# Patient Record
Sex: Male | Born: 1935
Health system: Southern US, Community
[De-identification: ages and names within clinical notes are randomized; demographics above are authoritative.]

## PROBLEM LIST (undated history)

## (undated) DIAGNOSIS — I255 Ischemic cardiomyopathy: Secondary | ICD-10-CM

## (undated) DIAGNOSIS — I495 Sick sinus syndrome: Secondary | ICD-10-CM

## (undated) DIAGNOSIS — Z950 Presence of cardiac pacemaker: Secondary | ICD-10-CM

## (undated) DIAGNOSIS — Z87442 Personal history of urinary calculi: Secondary | ICD-10-CM

## (undated) DIAGNOSIS — C9110 Chronic lymphocytic leukemia of B-cell type not having achieved remission: Secondary | ICD-10-CM

## (undated) DIAGNOSIS — I5020 Unspecified systolic (congestive) heart failure: Secondary | ICD-10-CM

## (undated) DIAGNOSIS — M1990 Unspecified osteoarthritis, unspecified site: Secondary | ICD-10-CM

## (undated) DIAGNOSIS — I441 Atrioventricular block, second degree: Secondary | ICD-10-CM

## (undated) DIAGNOSIS — Z8669 Personal history of other diseases of the nervous system and sense organs: Secondary | ICD-10-CM

## (undated) DIAGNOSIS — K5792 Diverticulitis of intestine, part unspecified, without perforation or abscess without bleeding: Secondary | ICD-10-CM

## (undated) DIAGNOSIS — K219 Gastro-esophageal reflux disease without esophagitis: Secondary | ICD-10-CM

## (undated) DIAGNOSIS — E7800 Pure hypercholesterolemia, unspecified: Secondary | ICD-10-CM

## (undated) DIAGNOSIS — C4490 Unspecified malignant neoplasm of skin, unspecified: Secondary | ICD-10-CM

## (undated) DIAGNOSIS — I2510 Atherosclerotic heart disease of native coronary artery without angina pectoris: Secondary | ICD-10-CM

## (undated) DIAGNOSIS — I447 Left bundle-branch block, unspecified: Secondary | ICD-10-CM

## (undated) DIAGNOSIS — D509 Iron deficiency anemia, unspecified: Secondary | ICD-10-CM

## (undated) DIAGNOSIS — I219 Acute myocardial infarction, unspecified: Secondary | ICD-10-CM

## (undated) DIAGNOSIS — H9190 Unspecified hearing loss, unspecified ear: Secondary | ICD-10-CM

## (undated) HISTORY — PX: EYE SURGERY: SHX253

## (undated) HISTORY — PX: BACK SURGERY: SHX140

## (undated) HISTORY — DX: Sick sinus syndrome: I49.5

## (undated) HISTORY — DX: Atherosclerotic heart disease of native coronary artery without angina pectoris: I25.10

## (undated) HISTORY — PX: TONSILLECTOMY: SUR1361

## (undated) HISTORY — PX: INSERT / REPLACE / REMOVE PACEMAKER: SUR710

## (undated) HISTORY — PX: CARDIAC CATHETERIZATION: SHX172

## (undated) HISTORY — DX: Ischemic cardiomyopathy: I25.5

## (undated) HISTORY — PX: SKIN CANCER EXCISION: SHX779

## (undated) SURGERY — PACEMAKER IMPLANT
Anesthesia: Choice

---

## 1981-02-25 HISTORY — PX: LUMBAR DISC SURGERY: SHX700

## 2003-12-20 ENCOUNTER — Ambulatory Visit: Payer: Self-pay | Admitting: Unknown Physician Specialty

## 2005-02-21 ENCOUNTER — Ambulatory Visit: Payer: Self-pay

## 2005-07-01 ENCOUNTER — Ambulatory Visit: Payer: Self-pay | Admitting: General Surgery

## 2005-07-01 ENCOUNTER — Emergency Department: Payer: Self-pay | Admitting: General Practice

## 2005-09-10 ENCOUNTER — Ambulatory Visit: Payer: Self-pay | Admitting: Urology

## 2008-02-26 HISTORY — PX: INGUINAL HERNIA REPAIR: SUR1180

## 2009-01-06 ENCOUNTER — Ambulatory Visit: Payer: Self-pay | Admitting: Unknown Physician Specialty

## 2010-10-20 ENCOUNTER — Inpatient Hospital Stay: Payer: Self-pay | Admitting: Internal Medicine

## 2012-03-02 ENCOUNTER — Ambulatory Visit: Payer: Self-pay | Admitting: Oncology

## 2012-03-02 LAB — CBC CANCER CENTER
Basophil %: 0.6 %
Eosinophil #: 0.2 x10 3/mm (ref 0.0–0.7)
Eosinophil %: 1.3 %
HCT: 44 % (ref 40.0–52.0)
HGB: 14.9 g/dL (ref 13.0–18.0)
Lymphocyte #: 11.8 x10 3/mm — ABNORMAL HIGH (ref 1.0–3.6)
MCH: 31.8 pg (ref 26.0–34.0)
MCHC: 33.9 g/dL (ref 32.0–36.0)
MCV: 94 fL (ref 80–100)
Monocyte #: 0.7 x10 3/mm (ref 0.2–1.0)
Monocyte %: 4.4 %
Neutrophil #: 3.5 x10 3/mm (ref 1.4–6.5)
Platelet: 235 x10 3/mm (ref 150–440)
RBC: 4.69 10*6/uL (ref 4.40–5.90)
WBC: 16.3 x10 3/mm — ABNORMAL HIGH (ref 3.8–10.6)

## 2012-03-02 LAB — LACTATE DEHYDROGENASE: LDH: 183 U/L (ref 85–241)

## 2012-03-28 ENCOUNTER — Ambulatory Visit: Payer: Self-pay | Admitting: Oncology

## 2012-06-04 ENCOUNTER — Ambulatory Visit: Payer: Self-pay | Admitting: Oncology

## 2012-06-08 LAB — CBC CANCER CENTER
Basophil #: 0.1 x10 3/mm (ref 0.0–0.1)
Basophil %: 0.4 %
Eosinophil %: 1.2 %
Lymphocyte #: 9.4 x10 3/mm — ABNORMAL HIGH (ref 1.0–3.6)
Lymphocyte %: 67.6 %
MCV: 94 fL (ref 80–100)
Monocyte #: 0.8 x10 3/mm (ref 0.2–1.0)
Neutrophil %: 25.2 %
Platelet: 179 x10 3/mm (ref 150–440)
RBC: 4.26 10*6/uL — ABNORMAL LOW (ref 4.40–5.90)
RDW: 13.9 % (ref 11.5–14.5)
WBC: 14 x10 3/mm — ABNORMAL HIGH (ref 3.8–10.6)

## 2012-06-25 ENCOUNTER — Ambulatory Visit: Payer: Self-pay | Admitting: Oncology

## 2012-08-25 ENCOUNTER — Ambulatory Visit: Payer: Self-pay | Admitting: Oncology

## 2012-12-08 ENCOUNTER — Ambulatory Visit: Payer: Self-pay | Admitting: Oncology

## 2012-12-08 LAB — CBC CANCER CENTER
Basophil #: 0.1 x10 3/mm (ref 0.0–0.1)
Eosinophil #: 0.1 x10 3/mm (ref 0.0–0.7)
HCT: 44.3 % (ref 40.0–52.0)
Lymphocyte #: 15.3 x10 3/mm — ABNORMAL HIGH (ref 1.0–3.6)
Lymphocyte %: 75.2 %
MCH: 32.5 pg (ref 26.0–34.0)
MCV: 96 fL (ref 80–100)
Monocyte #: 0.8 x10 3/mm (ref 0.2–1.0)
Monocyte %: 3.8 %
Neutrophil %: 20.2 %
Platelet: 261 x10 3/mm (ref 150–440)

## 2012-12-26 ENCOUNTER — Ambulatory Visit: Payer: Self-pay | Admitting: Oncology

## 2013-03-09 ENCOUNTER — Ambulatory Visit: Payer: Self-pay | Admitting: Oncology

## 2013-03-09 LAB — CBC CANCER CENTER
BASOS PCT: 0.6 %
Basophil #: 0.1 x10 3/mm (ref 0.0–0.1)
EOS ABS: 0.1 x10 3/mm (ref 0.0–0.7)
Eosinophil %: 1 %
HCT: 44.4 % (ref 40.0–52.0)
HGB: 14.5 g/dL (ref 13.0–18.0)
LYMPHS PCT: 72.8 %
Lymphocyte #: 11.1 x10 3/mm — ABNORMAL HIGH (ref 1.0–3.6)
MCH: 31 pg (ref 26.0–34.0)
MCHC: 32.6 g/dL (ref 32.0–36.0)
MCV: 95 fL (ref 80–100)
Monocyte #: 0.7 x10 3/mm (ref 0.2–1.0)
Monocyte %: 4.6 %
NEUTROS PCT: 21 %
Neutrophil #: 3.2 x10 3/mm (ref 1.4–6.5)
Platelet: 228 x10 3/mm (ref 150–440)
RBC: 4.67 10*6/uL (ref 4.40–5.90)
RDW: 13.9 % (ref 11.5–14.5)
WBC: 15.2 x10 3/mm — AB (ref 3.8–10.6)

## 2013-03-28 ENCOUNTER — Ambulatory Visit: Payer: Self-pay | Admitting: Oncology

## 2013-06-04 ENCOUNTER — Ambulatory Visit: Payer: Self-pay | Admitting: Oncology

## 2013-06-07 LAB — CBC CANCER CENTER
BASOS ABS: 0.1 x10 3/mm (ref 0.0–0.1)
BASOS PCT: 0.7 %
Eosinophil #: 0.1 x10 3/mm (ref 0.0–0.7)
Eosinophil %: 0.9 %
HCT: 41.4 % (ref 40.0–52.0)
HGB: 13.5 g/dL (ref 13.0–18.0)
Lymphocyte #: 8.6 x10 3/mm — ABNORMAL HIGH (ref 1.0–3.6)
Lymphocyte %: 65.7 %
MCH: 31.2 pg (ref 26.0–34.0)
MCHC: 32.7 g/dL (ref 32.0–36.0)
MCV: 95 fL (ref 80–100)
MONO ABS: 0.8 x10 3/mm (ref 0.2–1.0)
MONOS PCT: 5.9 %
NEUTROS ABS: 3.5 x10 3/mm (ref 1.4–6.5)
NEUTROS PCT: 26.8 %
PLATELETS: 202 x10 3/mm (ref 150–440)
RBC: 4.34 10*6/uL — AB (ref 4.40–5.90)
RDW: 14.2 % (ref 11.5–14.5)
WBC: 13.1 x10 3/mm — ABNORMAL HIGH (ref 3.8–10.6)

## 2013-06-25 ENCOUNTER — Ambulatory Visit: Payer: Self-pay | Admitting: Oncology

## 2013-09-06 ENCOUNTER — Ambulatory Visit: Payer: Self-pay | Admitting: Oncology

## 2013-09-06 LAB — CBC CANCER CENTER
Basophil #: 0.1 x10 3/mm (ref 0.0–0.1)
Basophil %: 0.6 %
EOS PCT: 1.1 %
Eosinophil #: 0.1 x10 3/mm (ref 0.0–0.7)
HCT: 40.7 % (ref 40.0–52.0)
HGB: 13.7 g/dL (ref 13.0–18.0)
Lymphocyte #: 9.9 x10 3/mm — ABNORMAL HIGH (ref 1.0–3.6)
Lymphocyte %: 73.2 %
MCH: 31.7 pg (ref 26.0–34.0)
MCHC: 33.7 g/dL (ref 32.0–36.0)
MCV: 94 fL (ref 80–100)
MONO ABS: 0.7 x10 3/mm (ref 0.2–1.0)
Monocyte %: 5.1 %
Neutrophil #: 2.7 x10 3/mm (ref 1.4–6.5)
Neutrophil %: 20 %
PLATELETS: 200 x10 3/mm (ref 150–440)
RBC: 4.33 10*6/uL — AB (ref 4.40–5.90)
RDW: 13.8 % (ref 11.5–14.5)
WBC: 13.5 x10 3/mm — AB (ref 3.8–10.6)

## 2013-09-25 ENCOUNTER — Ambulatory Visit: Payer: Self-pay | Admitting: Oncology

## 2013-12-07 ENCOUNTER — Ambulatory Visit: Payer: Self-pay | Admitting: Oncology

## 2013-12-07 LAB — CBC CANCER CENTER
BASOS PCT: 0.7 %
Basophil #: 0.1 x10 3/mm (ref 0.0–0.1)
Eosinophil #: 0.1 x10 3/mm (ref 0.0–0.7)
Eosinophil %: 0.9 %
HCT: 42.5 % (ref 40.0–52.0)
HGB: 14 g/dL (ref 13.0–18.0)
Lymphocyte #: 10.2 x10 3/mm — ABNORMAL HIGH (ref 1.0–3.6)
Lymphocyte %: 70.4 %
MCH: 31.5 pg (ref 26.0–34.0)
MCHC: 33 g/dL (ref 32.0–36.0)
MCV: 96 fL (ref 80–100)
MONO ABS: 0.7 x10 3/mm (ref 0.2–1.0)
Monocyte %: 4.9 %
NEUTROS PCT: 23.1 %
Neutrophil #: 3.4 x10 3/mm (ref 1.4–6.5)
PLATELETS: 214 x10 3/mm (ref 150–440)
RBC: 4.45 10*6/uL (ref 4.40–5.90)
RDW: 13.5 % (ref 11.5–14.5)
WBC: 14.5 x10 3/mm — ABNORMAL HIGH (ref 3.8–10.6)

## 2013-12-26 ENCOUNTER — Ambulatory Visit: Payer: Self-pay | Admitting: Oncology

## 2014-02-14 DIAGNOSIS — K219 Gastro-esophageal reflux disease without esophagitis: Secondary | ICD-10-CM | POA: Insufficient documentation

## 2014-02-14 DIAGNOSIS — E782 Mixed hyperlipidemia: Secondary | ICD-10-CM | POA: Insufficient documentation

## 2014-03-02 DIAGNOSIS — Z86010 Personal history of colonic polyps: Secondary | ICD-10-CM | POA: Insufficient documentation

## 2014-03-10 ENCOUNTER — Ambulatory Visit: Payer: Self-pay | Admitting: Oncology

## 2014-03-10 LAB — CBC CANCER CENTER
BASOS PCT: 0.4 %
Basophil #: 0.1 x10 3/mm (ref 0.0–0.1)
EOS PCT: 0.9 %
Eosinophil #: 0.1 x10 3/mm (ref 0.0–0.7)
HCT: 44.5 % (ref 40.0–52.0)
HGB: 14.6 g/dL (ref 13.0–18.0)
LYMPHS ABS: 12.8 x10 3/mm — AB (ref 1.0–3.6)
Lymphocyte %: 76.1 %
MCH: 30.8 pg (ref 26.0–34.0)
MCHC: 32.9 g/dL (ref 32.0–36.0)
MCV: 94 fL (ref 80–100)
MONO ABS: 0.6 x10 3/mm (ref 0.2–1.0)
Monocyte %: 3.6 %
Neutrophil #: 3.2 x10 3/mm (ref 1.4–6.5)
Neutrophil %: 19 %
Platelet: 234 x10 3/mm (ref 150–440)
RBC: 4.75 10*6/uL (ref 4.40–5.90)
RDW: 13.6 % (ref 11.5–14.5)
WBC: 16.8 x10 3/mm — ABNORMAL HIGH (ref 3.8–10.6)

## 2014-03-28 ENCOUNTER — Ambulatory Visit: Payer: Self-pay | Admitting: Oncology

## 2014-04-15 ENCOUNTER — Ambulatory Visit: Payer: Self-pay | Admitting: Unknown Physician Specialty

## 2014-06-09 ENCOUNTER — Ambulatory Visit
Admit: 2014-06-09 | Disposition: A | Discharge status: Home / Self Care | Payer: Self-pay | Attending: Oncology | Admitting: Oncology

## 2014-06-09 LAB — CBC CANCER CENTER
BASOS ABS: 0.1 x10 3/mm (ref 0.0–0.1)
Basophil %: 0.4 %
EOS PCT: 0.7 %
Eosinophil #: 0.1 x10 3/mm (ref 0.0–0.7)
HCT: 41.5 % (ref 40.0–52.0)
HGB: 13.8 g/dL (ref 13.0–18.0)
Lymphocyte #: 11 x10 3/mm — ABNORMAL HIGH (ref 1.0–3.6)
Lymphocyte %: 75.4 %
MCH: 31.2 pg (ref 26.0–34.0)
MCHC: 33.2 g/dL (ref 32.0–36.0)
MCV: 94 fL (ref 80–100)
MONOS PCT: 4.9 %
Monocyte #: 0.7 x10 3/mm (ref 0.2–1.0)
NEUTROS ABS: 2.7 x10 3/mm (ref 1.4–6.5)
Neutrophil %: 18.6 %
Platelet: 204 x10 3/mm (ref 150–440)
RBC: 4.41 10*6/uL (ref 4.40–5.90)
RDW: 14.2 % (ref 11.5–14.5)
WBC: 14.6 x10 3/mm — ABNORMAL HIGH (ref 3.8–10.6)

## 2014-06-20 LAB — SURGICAL PATHOLOGY

## 2014-07-11 ENCOUNTER — Encounter
Admission: RE | Admit: 2014-07-11 | Discharge: 2014-07-11 | Disposition: A | Discharge status: Home / Self Care | Payer: Commercial Managed Care - HMO | Source: Ambulatory Visit | Attending: Ophthalmology | Admitting: Ophthalmology

## 2014-07-11 DIAGNOSIS — Z01810 Encounter for preprocedural cardiovascular examination: Secondary | ICD-10-CM | POA: Diagnosis present

## 2014-07-11 DIAGNOSIS — I499 Cardiac arrhythmia, unspecified: Secondary | ICD-10-CM | POA: Diagnosis not present

## 2014-07-12 ENCOUNTER — Encounter: Payer: Self-pay | Admitting: *Deleted

## 2014-07-12 DIAGNOSIS — Z87442 Personal history of urinary calculi: Secondary | ICD-10-CM | POA: Diagnosis not present

## 2014-07-12 DIAGNOSIS — K5790 Diverticulosis of intestine, part unspecified, without perforation or abscess without bleeding: Secondary | ICD-10-CM | POA: Diagnosis not present

## 2014-07-12 DIAGNOSIS — Z85828 Personal history of other malignant neoplasm of skin: Secondary | ICD-10-CM | POA: Diagnosis not present

## 2014-07-12 DIAGNOSIS — R001 Bradycardia, unspecified: Secondary | ICD-10-CM | POA: Diagnosis not present

## 2014-07-12 DIAGNOSIS — H2511 Age-related nuclear cataract, right eye: Secondary | ICD-10-CM | POA: Diagnosis present

## 2014-07-12 DIAGNOSIS — K219 Gastro-esophageal reflux disease without esophagitis: Secondary | ICD-10-CM | POA: Diagnosis not present

## 2014-07-12 DIAGNOSIS — Z79899 Other long term (current) drug therapy: Secondary | ICD-10-CM | POA: Diagnosis not present

## 2014-07-12 DIAGNOSIS — Z792 Long term (current) use of antibiotics: Secondary | ICD-10-CM | POA: Diagnosis not present

## 2014-07-12 NOTE — Anesthesia Preprocedure Evaluation (Addendum)
Anesthesia Evaluation  Patient identified by MRN, date of birth, ID band Patient awake    Reviewed: Allergy & Precautions, NPO status , Patient's Chart, lab work & pertinent test results  Airway Mallampati: I  TM Distance: >3 FB Neck ROM: Full    Dental  (+) Partial Lower, Partial Upper   Pulmonary    Pulmonary exam normal       Cardiovascular Exercise Tolerance: Good Rhythm:Regular Rate:Bradycardia     Neuro/Psych    GI/Hepatic GERD-  Medicated and Controlled,  Endo/Other    Renal/GU      Musculoskeletal   Abdominal (+)  Abdomen: soft.    Peds  Hematology   Anesthesia Other Findings   Reproductive/Obstetrics                             Anesthesia Physical Anesthesia Plan  ASA: III  Anesthesia Plan: MAC   Post-op Pain Management:    Induction:   Airway Management Planned: Nasal Cannula  Additional Equipment:   Intra-op Plan:   Post-operative Plan:   Informed Consent: I have reviewed the patients History and Physical, chart, labs and discussed the procedure including the risks, benefits and alternatives for the proposed anesthesia with the patient or authorized representative who has indicated his/her understanding and acceptance.     Plan Discussed with: CRNA  Anesthesia Plan Comments:         Anesthesia Quick Evaluation

## 2014-07-13 NOTE — OR Nursing (Signed)
ekg to Dr Loleta Chance to review

## 2014-07-17 NOTE — H&P (Signed)
  History and physical was faxed and scanned in.   

## 2014-07-18 ENCOUNTER — Ambulatory Visit: Payer: Commercial Managed Care - HMO | Admitting: Anesthesiology

## 2014-07-18 ENCOUNTER — Encounter: Payer: Self-pay | Admitting: *Deleted

## 2014-07-18 ENCOUNTER — Encounter
Admission: RE | Disposition: A | Discharge status: Home / Self Care | Payer: Self-pay | Source: Ambulatory Visit | Attending: Ophthalmology

## 2014-07-18 ENCOUNTER — Ambulatory Visit
Admission: RE | Admit: 2014-07-18 | Discharge: 2014-07-18 | Disposition: A | Discharge status: Home / Self Care | Payer: Commercial Managed Care - HMO | Source: Ambulatory Visit | Attending: Ophthalmology | Admitting: Ophthalmology

## 2014-07-18 DIAGNOSIS — Z792 Long term (current) use of antibiotics: Secondary | ICD-10-CM | POA: Insufficient documentation

## 2014-07-18 DIAGNOSIS — Z85828 Personal history of other malignant neoplasm of skin: Secondary | ICD-10-CM | POA: Insufficient documentation

## 2014-07-18 DIAGNOSIS — R001 Bradycardia, unspecified: Secondary | ICD-10-CM | POA: Insufficient documentation

## 2014-07-18 DIAGNOSIS — Z87442 Personal history of urinary calculi: Secondary | ICD-10-CM | POA: Insufficient documentation

## 2014-07-18 DIAGNOSIS — Z79899 Other long term (current) drug therapy: Secondary | ICD-10-CM | POA: Insufficient documentation

## 2014-07-18 DIAGNOSIS — H2511 Age-related nuclear cataract, right eye: Secondary | ICD-10-CM | POA: Insufficient documentation

## 2014-07-18 DIAGNOSIS — K5790 Diverticulosis of intestine, part unspecified, without perforation or abscess without bleeding: Secondary | ICD-10-CM | POA: Insufficient documentation

## 2014-07-18 DIAGNOSIS — K219 Gastro-esophageal reflux disease without esophagitis: Secondary | ICD-10-CM | POA: Insufficient documentation

## 2014-07-18 HISTORY — DX: Unspecified hearing loss, unspecified ear: H91.90

## 2014-07-18 HISTORY — DX: Unspecified osteoarthritis, unspecified site: M19.90

## 2014-07-18 HISTORY — DX: Gastro-esophageal reflux disease without esophagitis: K21.9

## 2014-07-18 HISTORY — PX: CATARACT EXTRACTION W/PHACO: SHX586

## 2014-07-18 SURGERY — PHACOEMULSIFICATION, CATARACT, WITH IOL INSERTION
Anesthesia: Monitor Anesthesia Care | Laterality: Right

## 2014-07-18 MED ORDER — CYCLOPENTOLATE HCL 2 % OP SOLN
1.0000 [drp] | OPHTHALMIC | Status: AC
  Administered 2014-07-18 (×4): 1 [drp] via OPHTHALMIC

## 2014-07-18 MED ORDER — EPINEPHRINE HCL 1 MG/ML IJ SOLN
INTRAMUSCULAR | Status: AC
  Filled 2014-07-18: qty 1

## 2014-07-18 MED ORDER — EPINEPHRINE HCL 1 MG/ML IJ SOLN
INTRAMUSCULAR | Status: DC | PRN
  Administered 2014-07-18: 11:00:00 via OPHTHALMIC

## 2014-07-18 MED ORDER — LIDOCAINE HCL (PF) 4 % IJ SOLN
INTRAMUSCULAR | Status: AC
  Filled 2014-07-18: qty 5

## 2014-07-18 MED ORDER — MIDAZOLAM HCL 2 MG/2ML IJ SOLN
INTRAMUSCULAR | Status: DC | PRN
  Administered 2014-07-18: 1 mg via INTRAVENOUS

## 2014-07-18 MED ORDER — TETRACAINE HCL 0.5 % OP SOLN
OPHTHALMIC | Status: AC
  Filled 2014-07-18: qty 2

## 2014-07-18 MED ORDER — BUPIVACAINE HCL (PF) 0.75 % IJ SOLN
INTRAMUSCULAR | Status: AC
  Filled 2014-07-18: qty 10

## 2014-07-18 MED ORDER — MOXIFLOXACIN HCL 0.5 % OP SOLN - NO CHARGE
OPHTHALMIC | Status: DC | PRN
  Administered 2014-07-18: 1 [drp] via OPHTHALMIC

## 2014-07-18 MED ORDER — ONDANSETRON HCL 4 MG/2ML IJ SOLN
INTRAMUSCULAR | Status: DC | PRN
  Administered 2014-07-18: 4 mg via INTRAVENOUS

## 2014-07-18 MED ORDER — EPINEPHRINE HCL 1 MG/ML IJ SOLN
INTRAOCULAR | Status: DC | PRN
  Administered 2014-07-18: 200 mL

## 2014-07-18 MED ORDER — MOXIFLOXACIN HCL 0.5 % OP SOLN
1.0000 [drp] | OPHTHALMIC | Status: AC
Start: 2014-07-18 — End: 2014-07-18
  Administered 2014-07-18 (×3): 1 [drp] via OPHTHALMIC

## 2014-07-18 MED ORDER — PHENYLEPHRINE HCL 10 % OP SOLN
OPHTHALMIC | Status: AC
  Administered 2014-07-18: 1 [drp] via OPHTHALMIC
  Filled 2014-07-18: qty 5

## 2014-07-18 MED ORDER — HYALURONIDASE HUMAN 150 UNIT/ML IJ SOLN
INTRAMUSCULAR | Status: AC
  Filled 2014-07-18: qty 1

## 2014-07-18 MED ORDER — TETRACAINE HCL 0.5 % OP SOLN
OPHTHALMIC | Status: DC | PRN
  Administered 2014-07-18: 1 [drp] via OPHTHALMIC

## 2014-07-18 MED ORDER — PHENYLEPHRINE HCL 10 % OP SOLN
1.0000 [drp] | OPHTHALMIC | Status: AC
  Administered 2014-07-18 (×4): 1 [drp] via OPHTHALMIC

## 2014-07-18 MED ORDER — ALFENTANIL 500 MCG/ML IJ INJ
INJECTION | INTRAMUSCULAR | Status: DC | PRN
  Administered 2014-07-18 (×2): 500 ug via INTRAVENOUS

## 2014-07-18 MED ORDER — MOXIFLOXACIN HCL 0.5 % OP SOLN
OPHTHALMIC | Status: AC
  Administered 2014-07-18: 1 [drp] via OPHTHALMIC
  Filled 2014-07-18: qty 3

## 2014-07-18 MED ORDER — NA CHONDROIT SULF-NA HYALURON 40-17 MG/ML IO SOLN
INTRAOCULAR | Status: DC | PRN
  Administered 2014-07-18: 1 mL via INTRAOCULAR

## 2014-07-18 MED ORDER — SODIUM CHLORIDE 0.9 % IV SOLN
INTRAVENOUS | Status: DC
Start: 2014-07-18 — End: 2014-07-18
  Administered 2014-07-18: 10:00:00 via INTRAVENOUS

## 2014-07-18 MED ORDER — CEFUROXIME OPHTHALMIC INJECTION 1 MG/0.1 ML
INJECTION | OPHTHALMIC | Status: AC
  Filled 2014-07-18: qty 0.1

## 2014-07-18 MED ORDER — NA CHONDROIT SULF-NA HYALURON 40-17 MG/ML IO SOLN
INTRAOCULAR | Status: AC
  Filled 2014-07-18: qty 1

## 2014-07-18 MED ORDER — CARBACHOL 0.01 % IO SOLN
INTRAOCULAR | Status: DC | PRN
  Administered 2014-07-18: 0.5 mL via INTRAOCULAR

## 2014-07-18 MED ORDER — CYCLOPENTOLATE HCL 2 % OP SOLN
OPHTHALMIC | Status: AC
  Administered 2014-07-18: 1 [drp] via OPHTHALMIC
  Filled 2014-07-18: qty 2

## 2014-07-18 MED ORDER — CEFUROXIME OPHTHALMIC INJECTION 1 MG/0.1 ML
INJECTION | OPHTHALMIC | Status: DC | PRN
  Administered 2014-07-18: 0.1 mL via INTRACAMERAL

## 2014-07-18 SURGICAL SUPPLY — 27 items
ACTIVE FMS 8065152180 ×3 IMPLANT
CORD BIP STRL DISP 12FT (MISCELLANEOUS) ×3 IMPLANT
DRAPE XRAY CASSETTE 23X24 (DRAPES) ×3 IMPLANT
ERASER HMR WETFIELD 18G (MISCELLANEOUS) ×3 IMPLANT
GLOVE BIO SURGEON STRL SZ8 (GLOVE) ×3 IMPLANT
GLOVE SURG LX 6.5 MICRO (GLOVE) ×2
GLOVE SURG LX 8.0 MICRO (GLOVE) ×2
GLOVE SURG LX STRL 6.5 MICRO (GLOVE) ×1 IMPLANT
GLOVE SURG LX STRL 8.0 MICRO (GLOVE) ×1 IMPLANT
GOWN STRL REUS W/ TWL LRG LVL3 (GOWN DISPOSABLE) ×1 IMPLANT
GOWN STRL REUS W/ TWL XL LVL3 (GOWN DISPOSABLE) ×1 IMPLANT
GOWN STRL REUS W/TWL LRG LVL3 (GOWN DISPOSABLE) ×2
GOWN STRL REUS W/TWL XL LVL3 (GOWN DISPOSABLE) ×2
LENS IOL ACRYSERT 19.0 (Intraocular Lens) ×3 IMPLANT
PACK CATARACT (MISCELLANEOUS) ×3 IMPLANT
PACK CATARACT DINGLEDEIN LX (MISCELLANEOUS) ×3 IMPLANT
PACK EYE AFTER SURG (MISCELLANEOUS) ×3 IMPLANT
SHLD EYE VISITEC  UNIV (MISCELLANEOUS) ×3 IMPLANT
SN6CWS19.0 ACRYSOF LENS ×3 IMPLANT
SOL PREP PVP 2OZ (MISCELLANEOUS) ×3
SOLUTION PREP PVP 2OZ (MISCELLANEOUS) ×1 IMPLANT
SUT SILK 5-0 (SUTURE) ×3 IMPLANT
SYR 5ML LL (SYRINGE) ×3 IMPLANT
SYR TB 1ML 27GX1/2 LL (SYRINGE) ×3 IMPLANT
WATER STERILE IRR 1000ML POUR (IV SOLUTION) ×3 IMPLANT
WIPE NON LINTING 3.25X3.25 (MISCELLANEOUS) ×3 IMPLANT
sn6cws 19.0 acrysof lens ×3 IMPLANT

## 2014-07-18 NOTE — Interval H&P Note (Signed)
History and Physical Interval Note:  07/18/2014 11:05 AM  Christopher Daniels  has presented today for surgery, with the diagnosis of CATARACT  The various methods of treatment have been discussed with the patient and family. After consideration of risks, benefits and other options for treatment, the patient has consented to  Procedure(s): CATARACT EXTRACTION PHACO AND INTRAOCULAR LENS PLACEMENT ([place]) (Right) as a surgical intervention .  The patient's history has been reviewed, patient examined, no change in status, stable for surgery.  I have reviewed the patient's chart and labs.  Questions were answered to the patient's satisfaction.     Regan Llorente

## 2014-07-18 NOTE — Op Note (Signed)
,\   Date of Surgery: 07/18/2014 Date of Dictation: 07/18/2014 11:55 AM Pre-operative Diagnosis:Nuclear Sclerotic Cataract right Eye Post-operative Diagnosis: same Procedure performed: Extra-capsular Cataract Extraction (ECCE) with placement of a posterior chamber intraocular lens (IOL) right Eye IOL:  Implant Name Type Inv. Item Serial No. Manufacturer Lot No. LRB No. Used  [id number] acrysof lens     [id number]     Right 1   Anesthesia: 2% Lidocaine and 4% Marcaine in a 50/50 mixture with 10 unites/ml of Hylenex given as a peribulbar Anesthesiologist: Anesthesiologist: Elyse Hsu, MD CRNA: Leander Rams, CRNA Complications: none Estimated Blood Loss: less than 1 ml  Description of procedure:  The patient was given anesthesia and sedation via intravenous access. The patient was then prepped and draped in the usual fashion. A 25-gauge needle was bent for initiating the capsulorhexis. A 5-0 silk suture was placed through the conjunctiva superior and inferiorly to serve as bridle sutures. Hemostasis was obtained at the superior limbus using an eraser cautery. A partial thickness groove was made at the anterior surgical limbus with a 64 Beaver blade and this was dissected anteriorly with an Avaya. The anterior chamber was entered at 10 o'clock with a 1.0 mm paracentesis knife and through the lamellar dissection with a 2.6 mm Alcon keratome. DiscoVisc was injected to replace the aqueous and a continuous tear curvilinear capsulorhexis was performed using a bent 25-gauge needle.  Balance salt on a syringe was used to perform hydro-dissection and phacoemulsification was carried out using a divide and conquer technique. Procedure(s) with comments: CATARACT EXTRACTION PHACO AND INTRAOCULAR LENS PLACEMENT ([facility]) (Right) - [place] 03:11 AP% 28.4 CDE 75.64. Irrigation/aspiration was used to remove the residual cortex and the capsular bag was inflated with DiscoVisc. The intraocular lens was inserted  into the capsular bag using a pre-loaded Acrysert Delivery System. Irrigation/aspiration was used to remove the residual DiscoVisc. The wound was inflated with balanced salt and checked for leaks. None were found. Miostat was injected via the paracentesis track and 0.1 ml of cefuroxime containing 1 mg of drug  was injected via the paracentesis track. The wound was checked for leaks again and none were found.   The bridal sutures were removed and two drops of Vigamox were placed on the eye. An eye shield was placed to protect the eye and the patient was discharged to the recovery area in good condition.   Kayvion Arneson MD

## 2014-07-18 NOTE — Anesthesia Postprocedure Evaluation (Signed)
  Anesthesia Post-op Note  Patient: Christopher Daniels  Procedure(s) Performed: Procedure(s) with comments: CATARACT EXTRACTION PHACO AND INTRAOCULAR LENS PLACEMENT ([facility]) (Right) - [place] 03:11 AP% 28.4 CDE 75.64  Anesthesia type:MAC  Patient location: PACU  Post pain: Pain level controlled  Post assessment: Post-op Vital signs reviewed, Patient's Cardiovascular Status Stable, Respiratory Function Stable, Patent Airway and No signs of Nausea or vomiting  Post vital signs: Reviewed and stable  Last Vitals:  Filed Vitals:   07/18/14 1228  BP: 115/54  Pulse: 40  Temp:   Resp: 16    Level of consciousness: awake, alert  and patient cooperative  Complications: No apparent anesthesia complications

## 2014-07-18 NOTE — Transfer of Care (Signed)
Immediate Anesthesia Transfer of Care Note  Patient: Christopher Daniels  Procedure(s) Performed: Procedure(s) with comments: CATARACT EXTRACTION PHACO AND INTRAOCULAR LENS PLACEMENT ([facility]) (Right) - [place] 03:11 AP% 28.4 CDE 75.64  Patient Location: PACU  Anesthesia Type:MAC  Level of Consciousness: awake  Airway & Oxygen Therapy: Patient Spontanous Breathing  Post-op Assessment: Report given to RN  Post vital signs: stable  Last Vitals:  Filed Vitals:   07/18/14 1158  BP:   Pulse: 70  Temp: 36.2 C  Resp: 16    Complications: No apparent anesthesia complications

## 2014-07-18 NOTE — Discharge Instructions (Addendum)
See handout  Eye Surgery Discharge Instructions  Expect mild scratchy sensation or mild soreness. DO NOT RUB YOUR EYE!  The day of surgery: . Minimal physical activity, but bed rest is not required . No reading, computer work, or close hand work . No bending, lifting, or straining. . May watch TV  For 24 hours: . No driving, legal decisions, or alcoholic beverages . Safety precautions . Eat anything you prefer: It is better to start with liquids, then soup then solid foods. . __x___ Eye patch should be worn until postoperative exam tomorrow. . ____ Solar shield eyeglasses should be worn for comfort in the sunlight/patch while sleeping  Resume all regular medications including aspirin or Coumadin if these were discontinued prior to surgery. You may shower, bathe, shave, or wash your hair. Tylenol may be taken for mild discomfort.  Call your doctor if you experience significant pain, nausea, or vomiting, fever > 101 or other signs of infection. [phone] or [phone] Specific instructions:

## 2014-07-19 ENCOUNTER — Encounter: Payer: Self-pay | Admitting: Ophthalmology

## 2014-07-29 ENCOUNTER — Encounter
Admission: RE | Disposition: A | Discharge status: Home / Self Care | Payer: Self-pay | Source: Ambulatory Visit | Attending: Cardiology

## 2014-07-29 ENCOUNTER — Ambulatory Visit
Admission: RE | Admit: 2014-07-29 | Discharge: 2014-07-29 | Disposition: A | Discharge status: Home / Self Care | Payer: Commercial Managed Care - HMO | Source: Ambulatory Visit | Attending: Cardiology | Admitting: Cardiology

## 2014-07-29 ENCOUNTER — Encounter: Payer: Self-pay | Admitting: *Deleted

## 2014-07-29 DIAGNOSIS — I259 Chronic ischemic heart disease, unspecified: Secondary | ICD-10-CM | POA: Insufficient documentation

## 2014-07-29 DIAGNOSIS — Z833 Family history of diabetes mellitus: Secondary | ICD-10-CM | POA: Insufficient documentation

## 2014-07-29 DIAGNOSIS — R079 Chest pain, unspecified: Secondary | ICD-10-CM | POA: Diagnosis present

## 2014-07-29 DIAGNOSIS — Z8249 Family history of ischemic heart disease and other diseases of the circulatory system: Secondary | ICD-10-CM | POA: Diagnosis not present

## 2014-07-29 DIAGNOSIS — Z87891 Personal history of nicotine dependence: Secondary | ICD-10-CM | POA: Diagnosis not present

## 2014-07-29 HISTORY — PX: CARDIAC CATHETERIZATION: SHX172

## 2014-07-29 SURGERY — LEFT HEART CATH
Anesthesia: Moderate Sedation

## 2014-07-29 MED ORDER — SODIUM CHLORIDE 0.9 % IV SOLN: 250.0000 mL | INTRAVENOUS | Status: DC | PRN

## 2014-07-29 MED ORDER — MIDAZOLAM HCL 2 MG/2ML IJ SOLN
INTRAMUSCULAR | Status: AC
  Filled 2014-07-29: qty 2

## 2014-07-29 MED ORDER — FENTANYL CITRATE (PF) 100 MCG/2ML IJ SOLN
INTRAMUSCULAR | Status: DC | PRN
  Administered 2014-07-29: 25 ug via INTRAVENOUS

## 2014-07-29 MED ORDER — SODIUM CHLORIDE 0.9 % IJ SOLN: 3.0000 mL | INTRAMUSCULAR | Status: DC | PRN

## 2014-07-29 MED ORDER — ACETAMINOPHEN 325 MG PO TABS: 650.0000 mg | ORAL_TABLET | ORAL | Status: DC | PRN

## 2014-07-29 MED ORDER — SODIUM CHLORIDE 0.9 % IJ SOLN: 3.0000 mL | Freq: Two times a day (BID) | INTRAMUSCULAR | Status: DC

## 2014-07-29 MED ORDER — IOHEXOL 300 MG/ML  SOLN
INTRAMUSCULAR | Status: DC | PRN
  Administered 2014-07-29: 30 mL via INTRAVENOUS
  Administered 2014-07-29: 70 mL via INTRAVENOUS

## 2014-07-29 MED ORDER — MIDAZOLAM HCL 2 MG/2ML IJ SOLN
INTRAMUSCULAR | Status: DC | PRN
  Administered 2014-07-29 (×2): 1 mg via INTRAVENOUS

## 2014-07-29 MED ORDER — FENTANYL CITRATE (PF) 100 MCG/2ML IJ SOLN
INTRAMUSCULAR | Status: AC
Start: 2014-07-29 — End: 2014-07-29
  Filled 2014-07-29: qty 2

## 2014-07-29 MED ORDER — ONDANSETRON HCL 4 MG/2ML IJ SOLN: 4.0000 mg | Freq: Four times a day (QID) | INTRAMUSCULAR | Status: DC | PRN

## 2014-07-29 MED ORDER — SODIUM CHLORIDE 0.9 % IV SOLN
INTRAVENOUS | Status: DC
  Administered 2014-07-29: 13:00:00 via INTRAVENOUS

## 2014-07-29 MED ORDER — SODIUM CHLORIDE 0.9 % WEIGHT BASED INFUSION: 1.0000 mL/kg/h | INTRAVENOUS | Status: DC

## 2014-07-29 MED ORDER — HEPARIN (PORCINE) IN NACL 2-0.9 UNIT/ML-% IJ SOLN
INTRAMUSCULAR | Status: AC
  Filled 2014-07-29: qty 1000

## 2014-07-29 SURGICAL SUPPLY — 9 items
CATH EXPO 5FR FL4 (CATHETERS) ×3 IMPLANT
CATH EXPO 5FR FR4 (CATHETERS) ×3 IMPLANT
CATH INFINITI 5FR ANG PIGTAIL (CATHETERS) ×3 IMPLANT
DEVICE CLOSURE MYNXGRIP 5F (Vascular Products) ×3 IMPLANT
KIT MANI 3VAL PERCEP (MISCELLANEOUS) ×3 IMPLANT
NEEDLE PERC 18GX7CM (NEEDLE) ×3 IMPLANT
PACK CARDIAC CATH (CUSTOM PROCEDURE TRAY) ×3 IMPLANT
SHEATH AVANTI 5FR X 11CM (SHEATH) ×3 IMPLANT
WIRE EMERALD 3MM-J .035X150CM (WIRE) ×3 IMPLANT

## 2014-07-29 NOTE — Discharge Instructions (Signed)

## 2014-07-29 NOTE — Progress Notes (Signed)
Pt doing well post heart cath, family present with discharge teaching and to call for return appts, pt without bleeding nor hematoma at right groin site,  For discharge.

## 2014-08-01 ENCOUNTER — Encounter: Payer: Self-pay | Admitting: Cardiology

## 2014-09-08 ENCOUNTER — Other Ambulatory Visit: Payer: Self-pay | Admitting: Oncology

## 2014-09-08 ENCOUNTER — Inpatient Hospital Stay: Payer: Commercial Managed Care - HMO | Attending: Oncology

## 2014-09-08 DIAGNOSIS — D696 Thrombocytopenia, unspecified: Secondary | ICD-10-CM | POA: Insufficient documentation

## 2014-09-08 DIAGNOSIS — C9110 Chronic lymphocytic leukemia of B-cell type not having achieved remission: Secondary | ICD-10-CM

## 2014-09-08 LAB — CBC WITH DIFFERENTIAL/PLATELET
BASOS PCT: 1 %
Basophils Absolute: 0.1 10*3/uL (ref 0–0.1)
EOS PCT: 1 %
Eosinophils Absolute: 0.1 10*3/uL (ref 0–0.7)
HCT: 43 % (ref 40.0–52.0)
HEMOGLOBIN: 14.2 g/dL (ref 13.0–18.0)
LYMPHS ABS: 10.3 10*3/uL — AB (ref 1.0–3.6)
Lymphocytes Relative: 66 %
MCH: 31.2 pg (ref 26.0–34.0)
MCHC: 33.1 g/dL (ref 32.0–36.0)
MCV: 94.4 fL (ref 80.0–100.0)
MONO ABS: 0.5 10*3/uL (ref 0.2–1.0)
MONOS PCT: 3 %
Neutro Abs: 4.3 10*3/uL (ref 1.4–6.5)
Neutrophils Relative %: 29 %
PLATELETS: 211 10*3/uL (ref 150–440)
RBC: 4.56 MIL/uL (ref 4.40–5.90)
RDW: 13.6 % (ref 11.5–14.5)
WBC: 15.2 10*3/uL — AB (ref 3.8–10.6)

## 2014-12-09 ENCOUNTER — Other Ambulatory Visit: Payer: Self-pay | Admitting: *Deleted

## 2014-12-09 DIAGNOSIS — C9110 Chronic lymphocytic leukemia of B-cell type not having achieved remission: Secondary | ICD-10-CM

## 2014-12-15 ENCOUNTER — Inpatient Hospital Stay: Payer: Commercial Managed Care - HMO | Attending: Oncology

## 2014-12-15 ENCOUNTER — Inpatient Hospital Stay (HOSPITAL_BASED_OUTPATIENT_CLINIC_OR_DEPARTMENT_OTHER): Payer: Commercial Managed Care - HMO | Admitting: Oncology

## 2014-12-15 VITALS — BP 136/69 | HR 39 | Temp 97.1°F | Resp 16 | Wt 205.9 lb

## 2014-12-15 DIAGNOSIS — K219 Gastro-esophageal reflux disease without esophagitis: Secondary | ICD-10-CM | POA: Diagnosis not present

## 2014-12-15 DIAGNOSIS — Z7982 Long term (current) use of aspirin: Secondary | ICD-10-CM | POA: Diagnosis not present

## 2014-12-15 DIAGNOSIS — H9190 Unspecified hearing loss, unspecified ear: Secondary | ICD-10-CM | POA: Diagnosis not present

## 2014-12-15 DIAGNOSIS — I499 Cardiac arrhythmia, unspecified: Secondary | ICD-10-CM

## 2014-12-15 DIAGNOSIS — Z87442 Personal history of urinary calculi: Secondary | ICD-10-CM | POA: Diagnosis not present

## 2014-12-15 DIAGNOSIS — M129 Arthropathy, unspecified: Secondary | ICD-10-CM

## 2014-12-15 DIAGNOSIS — Z79899 Other long term (current) drug therapy: Secondary | ICD-10-CM | POA: Insufficient documentation

## 2014-12-15 DIAGNOSIS — Z85828 Personal history of other malignant neoplasm of skin: Secondary | ICD-10-CM | POA: Diagnosis not present

## 2014-12-15 DIAGNOSIS — C9110 Chronic lymphocytic leukemia of B-cell type not having achieved remission: Secondary | ICD-10-CM | POA: Insufficient documentation

## 2014-12-15 LAB — CBC WITH DIFFERENTIAL/PLATELET
Basophils Absolute: 0 10*3/uL (ref 0–0.1)
Basophils Relative: 0 %
EOS ABS: 0.1 10*3/uL (ref 0–0.7)
EOS PCT: 1 %
HCT: 43 % (ref 40.0–52.0)
HEMOGLOBIN: 14.3 g/dL (ref 13.0–18.0)
Lymphocytes Relative: 79 %
Lymphs Abs: 14.6 10*3/uL — ABNORMAL HIGH (ref 1.0–3.6)
MCH: 31.2 pg (ref 26.0–34.0)
MCHC: 33.3 g/dL (ref 32.0–36.0)
MCV: 93.9 fL (ref 80.0–100.0)
MONOS PCT: 5 %
Monocytes Absolute: 0.8 10*3/uL (ref 0.2–1.0)
NEUTROS PCT: 15 %
Neutro Abs: 2.8 10*3/uL (ref 1.4–6.5)
PLATELETS: 218 10*3/uL (ref 150–440)
RBC: 4.58 MIL/uL (ref 4.40–5.90)
RDW: 14.3 % (ref 11.5–14.5)
WBC: 18.4 10*3/uL — ABNORMAL HIGH (ref 3.8–10.6)

## 2014-12-15 NOTE — Progress Notes (Signed)
Patient has been evaluated by cardiology and was diagnosed with left bundle branch block causing bradycardia.  Cardiology told him nothing to be done right now but he may need a pacemaker at the beginning of next year.

## 2014-12-29 NOTE — Progress Notes (Signed)
[facility]  [phone] [phone]) [phone]  ID: Christopher Daniels OB: 12-03-1934  MR#: [id number]  AOZ#:[id number]  Patient Care Team: Rusty Aus, MD as PCP - General (Internal Medicine)  CHIEF COMPLAINT:  Chief Complaint  Patient presents with  . CLL    INTERVAL HISTORY: Patient returns to clinic today for laboratory work and further evaluation.  He continues to feel well and is asymptomatic.  He denies any recent fevers or illnesses.  He denies any night sweats.  He has a good appetite and denies weight loss.  He has no neurologic complaints.  He denies any chest pain or shortness of breath.  He denies any nausea, vomiting, constipation, or diarrhea.  He has no urinary complaints.  Patient offers no specific complaints today.  REVIEW OF SYSTEMS:   Review of Systems  Constitutional: Negative for fever, weight loss and malaise/fatigue.  Respiratory: Negative.   Cardiovascular: Negative.   Gastrointestinal: Negative.   Musculoskeletal: Negative.   Neurological: Negative.  Negative for weakness.    As per HPI. Otherwise, a complete review of systems is negatve.  PAST MEDICAL HISTORY: Past Medical History  Diagnosis Date  . GERD (gastroesophageal reflux disease)   . Arthritis   . HOH (hard of hearing)   . Kidney calculi   . Cancer     skin  . Dysrhythmia     PAST SURGICAL HISTORY: Past Surgical History  Procedure Laterality Date  . Back surgery    . Hernia repair    . Cataract extraction w/phaco Right 07/18/2014    Procedure: CATARACT EXTRACTION PHACO AND INTRAOCULAR LENS PLACEMENT ([facility]);  Surgeon: Estill Cotta, MD;  Location: [facility];  Service: Ophthalmology;  Laterality: Right;  [place] 03:11 AP% 28.4 CDE 75.64  . Cardiac catheterization N/A 07/29/2014    Procedure: Left Heart Cath;  Surgeon: Teodoro Spray, MD;  Location: [facility];  Service: Cardiovascular;  Laterality: N/A;    FAMILY HISTORY No family history on  file.     ADVANCED DIRECTIVES:    HEALTH MAINTENANCE: Social History  Substance Use Topics  . Smoking status: Never Smoker   . Smokeless tobacco: Not on file  . Alcohol Use: No     Colonoscopy:  PAP:  Bone density:  Lipid panel:  Allergies  Allergen Reactions  . Niaspan [Niacin Er] Anaphylaxis    Current Outpatient Prescriptions  Medication Sig Dispense Refill  . amoxicillin (AMOXIL) 500 MG capsule take 1 capsule by mouth three times a day until finished  0  . aspirin 81 MG tablet Take 81 mg by mouth daily.    Marland Kitchen ibuprofen (ADVIL,MOTRIN) 200 MG tablet Take 200 mg by mouth every 6 (six) hours as needed.    Marland Kitchen omeprazole (PRILOSEC) 20 MG capsule Take 20 mg by mouth daily.    . polyethylene glycol-electrolytes (GAVILYTE-N WITH FLAVOR PACK) 420 G solution     . pravastatin (PRAVACHOL) 40 MG tablet Take 40 mg by mouth daily.     No current facility-administered medications for this visit.    OBJECTIVE: Filed Vitals:   12/15/14 1152  BP: 136/69  Pulse: 39  Temp: 97.1 F (36.2 C)  Resp: 16     Body mass index is 28.73 kg/(m^2).    ECOG FS:0 - Asymptomatic  General: Well-developed, well-nourished, no acute distress. Eyes: Pink conjunctiva, anicteric sclera. Lungs: Clear to auscultation bilaterally. Heart: Regular rate and rhythm. No rubs, murmurs, or gallops. Abdomen: Soft, nontender, nondistended. No organomegaly noted, normoactive bowel sounds.  Musculoskeletal: No edema, cyanosis, or clubbing. Neuro: Alert, answering all questions appropriately. Cranial nerves grossly intact. Skin: No rashes or petechiae noted. Psych: Normal affect. Lymphatics: No cervical, calvicular, axillary or inguinal LAD.   LAB RESULTS:  No results found for: NA, K, CL, CO2, GLUCOSE, BUN, CREATININE, CALCIUM, PROT, ALBUMIN, AST, ALT, ALKPHOS, BILITOT, GFRNONAA, GFRAA  Lab Results  Component Value Date   WBC 18.4* 12/15/2014   NEUTROABS 2.8 12/15/2014   HGB 14.3 12/15/2014   HCT 43.0  12/15/2014   MCV 93.9 12/15/2014   PLT 218 12/15/2014     STUDIES: No results found.  ASSESSMENT: CLL, Rai stage 0  PLAN:    1.  CLL:  Patient's white count is elevated, but essentially unchanged.  Baseline CT in August 2014 was reviewed independently and did not reveal any underlying lymphadenopathy.  This does not need to be repeated unless treatment is necessary.  No intervention is needed at this time since he is a stage 0.  He does not require a bone marrow biopsy at this time.  Return to clinic in 6 months for laboratory work and further evaluation.  Patient expressed understanding and was in agreement with this plan. He also understands that He can call clinic at any time with any questions, concerns, or complaints.    Lloyd Huger, MD   12/29/2014 3:05 PM

## 2015-04-18 DIAGNOSIS — H2512 Age-related nuclear cataract, left eye: Secondary | ICD-10-CM | POA: Diagnosis not present

## 2015-05-18 DIAGNOSIS — Z0000 Encounter for general adult medical examination without abnormal findings: Secondary | ICD-10-CM | POA: Diagnosis not present

## 2015-06-12 DIAGNOSIS — H2512 Age-related nuclear cataract, left eye: Secondary | ICD-10-CM | POA: Diagnosis not present

## 2015-06-13 ENCOUNTER — Encounter: Payer: Self-pay | Admitting: *Deleted

## 2015-06-15 ENCOUNTER — Inpatient Hospital Stay (HOSPITAL_BASED_OUTPATIENT_CLINIC_OR_DEPARTMENT_OTHER): Payer: PPO | Admitting: Oncology

## 2015-06-15 ENCOUNTER — Inpatient Hospital Stay: Payer: PPO | Attending: Oncology

## 2015-06-15 VITALS — BP 119/76 | HR 80 | Temp 98.1°F | Resp 18 | Wt 201.1 lb

## 2015-06-15 DIAGNOSIS — Z7982 Long term (current) use of aspirin: Secondary | ICD-10-CM

## 2015-06-15 DIAGNOSIS — C9110 Chronic lymphocytic leukemia of B-cell type not having achieved remission: Secondary | ICD-10-CM | POA: Diagnosis not present

## 2015-06-15 DIAGNOSIS — Z87442 Personal history of urinary calculi: Secondary | ICD-10-CM | POA: Diagnosis not present

## 2015-06-15 DIAGNOSIS — Z79899 Other long term (current) drug therapy: Secondary | ICD-10-CM | POA: Insufficient documentation

## 2015-06-15 DIAGNOSIS — M129 Arthropathy, unspecified: Secondary | ICD-10-CM | POA: Diagnosis not present

## 2015-06-15 DIAGNOSIS — K219 Gastro-esophageal reflux disease without esophagitis: Secondary | ICD-10-CM

## 2015-06-15 DIAGNOSIS — Z85828 Personal history of other malignant neoplasm of skin: Secondary | ICD-10-CM | POA: Insufficient documentation

## 2015-06-15 DIAGNOSIS — H9190 Unspecified hearing loss, unspecified ear: Secondary | ICD-10-CM

## 2015-06-15 DIAGNOSIS — R001 Bradycardia, unspecified: Secondary | ICD-10-CM | POA: Insufficient documentation

## 2015-06-15 DIAGNOSIS — Z8669 Personal history of other diseases of the nervous system and sense organs: Secondary | ICD-10-CM | POA: Diagnosis not present

## 2015-06-15 DIAGNOSIS — I447 Left bundle-branch block, unspecified: Secondary | ICD-10-CM

## 2015-06-15 LAB — CBC WITH DIFFERENTIAL/PLATELET
BASOS ABS: 0.1 10*3/uL (ref 0–0.1)
BASOS PCT: 1 %
Eosinophils Absolute: 0.1 10*3/uL (ref 0–0.7)
Eosinophils Relative: 0 %
HEMATOCRIT: 43 % (ref 40.0–52.0)
HEMOGLOBIN: 14.6 g/dL (ref 13.0–18.0)
Lymphocytes Relative: 75 %
Lymphs Abs: 11.9 10*3/uL — ABNORMAL HIGH (ref 1.0–3.6)
MCH: 32 pg (ref 26.0–34.0)
MCHC: 34 g/dL (ref 32.0–36.0)
MCV: 94 fL (ref 80.0–100.0)
Monocytes Absolute: 0.6 10*3/uL (ref 0.2–1.0)
Monocytes Relative: 4 %
NEUTROS ABS: 3.2 10*3/uL (ref 1.4–6.5)
NEUTROS PCT: 20 %
Platelets: 214 10*3/uL (ref 150–440)
RBC: 4.58 MIL/uL (ref 4.40–5.90)
RDW: 14.2 % (ref 11.5–14.5)
WBC: 15.9 10*3/uL — ABNORMAL HIGH (ref 3.8–10.6)

## 2015-06-15 NOTE — Progress Notes (Signed)
Patient does not offer any problems today.  

## 2015-06-18 NOTE — Progress Notes (Signed)
[place]  [phone]) [phone] [phone]) [phone]  ID: Christopher Daniels OB: 11-12-1934  MR#: [id number]  XKG#:[id number]  Patient Care Team: Rusty Aus, MD as PCP - General (Internal Medicine)  CHIEF COMPLAINT:  Chief Complaint  Patient presents with  . CLL    INTERVAL HISTORY: Patient returns to clinic today for laboratory work and further evaluation.  He continues to feel well and is asymptomatic.  He denies any recent fevers or illnesses.  He denies any night sweats.  He has a good appetite and denies weight loss.  He has no neurologic complaints.  He denies any chest pain or shortness of breath.  He denies any nausea, vomiting, constipation, or diarrhea.  He has no urinary complaints.  Patient offers no specific complaints today.  REVIEW OF SYSTEMS:   Review of Systems  Constitutional: Negative for fever, weight loss and malaise/fatigue.  Respiratory: Negative.  Negative for shortness of breath.   Cardiovascular: Negative.  Negative for chest pain.  Gastrointestinal: Negative.   Musculoskeletal: Negative.   Neurological: Negative.  Negative for weakness.  Psychiatric/Behavioral: Negative.     As per HPI. Otherwise, a complete review of systems is negatve.  PAST MEDICAL HISTORY: Past Medical History  Diagnosis Date  . GERD (gastroesophageal reflux disease)   . Arthritis   . HOH (hard of hearing)   . Dysrhythmia     bradycardia  . LBBB (left bundle branch block)   . H/O Bell's palsy   . Diverticulitis   . Kidney calculi   . Cancer [facility])     skin/CLL    PAST SURGICAL HISTORY: Past Surgical History  Procedure Laterality Date  . Back surgery    . Hernia repair    . Cataract extraction w/phaco Right 07/18/2014    Procedure: CATARACT EXTRACTION PHACO AND INTRAOCULAR LENS PLACEMENT ([facility]);  Surgeon: Estill Cotta, MD;  Location: [facility];  Service: Ophthalmology;  Laterality: Right;  [place] 03:11 AP% 28.4 CDE 75.64  . Cardiac catheterization  N/A 07/29/2014    Procedure: Left Heart Cath;  Surgeon: Teodoro Spray, MD;  Location: [place] [facility];  Service: Cardiovascular;  Laterality: N/A;    FAMILY HISTORY: Reviewed and unchanged. No reported history of malignancy or chronic disease.     ADVANCED DIRECTIVES:    HEALTH MAINTENANCE: Social History  Substance Use Topics  . Smoking status: Never Smoker   . Smokeless tobacco: Not on file  . Alcohol Use: No     Colonoscopy:  PAP:  Bone density:  Lipid panel:  Allergies  Allergen Reactions  . Niaspan [Niacin Er] Anaphylaxis    Current Outpatient Prescriptions  Medication Sig Dispense Refill  . aspirin 81 MG tablet Take 81 mg by mouth daily.    Marland Kitchen atorvastatin (LIPITOR) 10 MG tablet Take 10 mg by mouth daily.    Marland Kitchen ibuprofen (ADVIL,MOTRIN) 200 MG tablet Take 200 mg by mouth every 6 (six) hours as needed.    Marland Kitchen omeprazole (PRILOSEC) 20 MG capsule Take 20 mg by mouth daily.     No current facility-administered medications for this visit.    OBJECTIVE: Filed Vitals:   06/15/15 1051  BP: 119/76  Pulse: 80  Temp: 98.1 F (36.7 C)  Resp: 18     Body mass index is 28.05 kg/(m^2).    ECOG FS:0 - Asymptomatic  General: Well-developed, well-nourished, no acute distress. Eyes: Pink conjunctiva, anicteric sclera. Lungs: Clear to auscultation bilaterally. Heart: Regular rate and rhythm. No rubs, murmurs, or gallops. Abdomen:  Soft, nontender, nondistended. No organomegaly noted, normoactive bowel sounds. Musculoskeletal: No edema, cyanosis, or clubbing. Neuro: Alert, answering all questions appropriately. Cranial nerves grossly intact. Skin: No rashes or petechiae noted. Psych: Normal affect. Lymphatics: No cervical, calvicular, axillary or inguinal LAD.   LAB RESULTS:  No results found for: NA, K, CL, CO2, GLUCOSE, BUN, CREATININE, CALCIUM, PROT, ALBUMIN, AST, ALT, ALKPHOS, BILITOT, GFRNONAA, GFRAA  Lab Results  Component Value Date   WBC 15.9* 06/15/2015    NEUTROABS 3.2 06/15/2015   HGB 14.6 06/15/2015   HCT 43.0 06/15/2015   MCV 94.0 06/15/2015   PLT 214 06/15/2015     STUDIES: No results found.  ASSESSMENT: CLL, Rai stage 0  PLAN:    1.  CLL:  Patient's white count is elevated, but essentially unchanged.  Baseline CT in August 2014 was reviewed independently and did not reveal any underlying lymphadenopathy.  This does not need to be repeated unless treatment is necessary.  No intervention is needed at this time since he is a stage 0.  He does not require a bone marrow biopsy at this time.  Return to clinic in 6 months for laboratory work only and then in one year for laboratory work and further evaluation.  Patient expressed understanding and was in agreement with this plan. He also understands that He can call clinic at any time with any questions, concerns, or complaints.    Lloyd Huger, MD   06/18/2015 10:34 PM

## 2015-06-19 ENCOUNTER — Ambulatory Visit: Payer: PPO | Admitting: Anesthesiology

## 2015-06-19 ENCOUNTER — Encounter
Admission: RE | Disposition: A | Discharge status: Home / Self Care | Payer: Self-pay | Source: Ambulatory Visit | Attending: Ophthalmology

## 2015-06-19 ENCOUNTER — Ambulatory Visit
Admission: RE | Admit: 2015-06-19 | Discharge: 2015-06-19 | Disposition: A | Discharge status: Home / Self Care | Payer: PPO | Source: Ambulatory Visit | Attending: Ophthalmology | Admitting: Ophthalmology

## 2015-06-19 ENCOUNTER — Encounter: Payer: Self-pay | Admitting: *Deleted

## 2015-06-19 DIAGNOSIS — Z7982 Long term (current) use of aspirin: Secondary | ICD-10-CM | POA: Diagnosis not present

## 2015-06-19 DIAGNOSIS — Z9849 Cataract extraction status, unspecified eye: Secondary | ICD-10-CM | POA: Insufficient documentation

## 2015-06-19 DIAGNOSIS — M1990 Unspecified osteoarthritis, unspecified site: Secondary | ICD-10-CM | POA: Diagnosis not present

## 2015-06-19 DIAGNOSIS — R001 Bradycardia, unspecified: Secondary | ICD-10-CM | POA: Insufficient documentation

## 2015-06-19 DIAGNOSIS — H2512 Age-related nuclear cataract, left eye: Secondary | ICD-10-CM | POA: Insufficient documentation

## 2015-06-19 DIAGNOSIS — Z85828 Personal history of other malignant neoplasm of skin: Secondary | ICD-10-CM | POA: Insufficient documentation

## 2015-06-19 DIAGNOSIS — H9190 Unspecified hearing loss, unspecified ear: Secondary | ICD-10-CM | POA: Insufficient documentation

## 2015-06-19 DIAGNOSIS — K5792 Diverticulitis of intestine, part unspecified, without perforation or abscess without bleeding: Secondary | ICD-10-CM | POA: Insufficient documentation

## 2015-06-19 DIAGNOSIS — Z87442 Personal history of urinary calculi: Secondary | ICD-10-CM | POA: Insufficient documentation

## 2015-06-19 DIAGNOSIS — Z8669 Personal history of other diseases of the nervous system and sense organs: Secondary | ICD-10-CM | POA: Insufficient documentation

## 2015-06-19 DIAGNOSIS — C9110 Chronic lymphocytic leukemia of B-cell type not having achieved remission: Secondary | ICD-10-CM | POA: Insufficient documentation

## 2015-06-19 DIAGNOSIS — K219 Gastro-esophageal reflux disease without esophagitis: Secondary | ICD-10-CM | POA: Insufficient documentation

## 2015-06-19 DIAGNOSIS — I447 Left bundle-branch block, unspecified: Secondary | ICD-10-CM | POA: Insufficient documentation

## 2015-06-19 DIAGNOSIS — Z79899 Other long term (current) drug therapy: Secondary | ICD-10-CM | POA: Insufficient documentation

## 2015-06-19 HISTORY — DX: Diverticulitis of intestine, part unspecified, without perforation or abscess without bleeding: K57.92

## 2015-06-19 HISTORY — DX: Left bundle-branch block, unspecified: I44.7

## 2015-06-19 HISTORY — DX: Personal history of other diseases of the nervous system and sense organs: Z86.69

## 2015-06-19 HISTORY — PX: CATARACT EXTRACTION W/PHACO: SHX586

## 2015-06-19 SURGERY — PHACOEMULSIFICATION, CATARACT, WITH IOL INSERTION
Anesthesia: Monitor Anesthesia Care | Site: Eye | Laterality: Left | Wound class: Clean

## 2015-06-19 MED ORDER — CEFUROXIME OPHTHALMIC INJECTION 1 MG/0.1 ML
INJECTION | OPHTHALMIC | Status: AC
  Filled 2015-06-19: qty 0.1

## 2015-06-19 MED ORDER — POVIDONE-IODINE 5 % OP SOLN
OPHTHALMIC | Status: DC | PRN
  Administered 2015-06-19: 1 via OPHTHALMIC

## 2015-06-19 MED ORDER — MOXIFLOXACIN HCL 0.5 % OP SOLN
OPHTHALMIC | Status: DC | PRN
  Administered 2015-06-19: 1 [drp] via OPHTHALMIC

## 2015-06-19 MED ORDER — CYCLOPENTOLATE HCL 2 % OP SOLN
OPHTHALMIC | Status: AC
  Administered 2015-06-19: 1 [drp] via OPHTHALMIC
  Filled 2015-06-19: qty 2

## 2015-06-19 MED ORDER — BUPIVACAINE HCL (PF) 0.75 % IJ SOLN
INTRAMUSCULAR | Status: DC | PRN
  Administered 2015-06-19: 4 mL via OPHTHALMIC

## 2015-06-19 MED ORDER — TETRACAINE HCL 0.5 % OP SOLN
OPHTHALMIC | Status: DC | PRN
  Administered 2015-06-19: 1 [drp] via OPHTHALMIC

## 2015-06-19 MED ORDER — MOXIFLOXACIN HCL 0.5 % OP SOLN
1.0000 [drp] | OPHTHALMIC | Status: AC | PRN
  Administered 2015-06-19 (×3): 1 [drp] via OPHTHALMIC

## 2015-06-19 MED ORDER — NA CHONDROIT SULF-NA HYALURON 40-17 MG/ML IO SOLN
INTRAOCULAR | Status: DC | PRN
  Administered 2015-06-19: 1 mL via INTRAOCULAR

## 2015-06-19 MED ORDER — TETRACAINE HCL 0.5 % OP SOLN
OPHTHALMIC | Status: AC
  Filled 2015-06-19: qty 2

## 2015-06-19 MED ORDER — CARBACHOL 0.01 % IO SOLN
INTRAOCULAR | Status: DC | PRN
  Administered 2015-06-19: .5 mL via INTRAOCULAR

## 2015-06-19 MED ORDER — GLYCOPYRROLATE 0.2 MG/ML IJ SOLN
INTRAMUSCULAR | Status: DC | PRN
  Administered 2015-06-19 (×2): .2 mg via INTRAVENOUS

## 2015-06-19 MED ORDER — HYALURONIDASE HUMAN 150 UNIT/ML IJ SOLN
INTRAMUSCULAR | Status: AC
  Filled 2015-06-19: qty 1

## 2015-06-19 MED ORDER — NA CHONDROIT SULF-NA HYALURON 40-17 MG/ML IO SOLN
INTRAOCULAR | Status: AC
  Filled 2015-06-19: qty 1

## 2015-06-19 MED ORDER — LIDOCAINE HCL (PF) 4 % IJ SOLN
INTRAMUSCULAR | Status: DC | PRN
  Administered 2015-06-19: .5 mL via OPHTHALMIC

## 2015-06-19 MED ORDER — LIDOCAINE HCL (PF) 4 % IJ SOLN
INTRAMUSCULAR | Status: AC
  Filled 2015-06-19: qty 5

## 2015-06-19 MED ORDER — MIDAZOLAM HCL 2 MG/2ML IJ SOLN
INTRAMUSCULAR | Status: DC | PRN
  Administered 2015-06-19: 1 mg via INTRAVENOUS

## 2015-06-19 MED ORDER — CYCLOPENTOLATE HCL 2 % OP SOLN
1.0000 [drp] | OPHTHALMIC | Status: AC | PRN
  Administered 2015-06-19 (×4): 1 [drp] via OPHTHALMIC

## 2015-06-19 MED ORDER — CEFUROXIME OPHTHALMIC INJECTION 1 MG/0.1 ML
INJECTION | OPHTHALMIC | Status: DC | PRN
  Administered 2015-06-19: .1 mL via INTRACAMERAL

## 2015-06-19 MED ORDER — BUPIVACAINE HCL (PF) 0.75 % IJ SOLN
INTRAMUSCULAR | Status: AC
  Filled 2015-06-19: qty 10

## 2015-06-19 MED ORDER — MOXIFLOXACIN HCL 0.5 % OP SOLN
OPHTHALMIC | Status: AC
Start: 2015-06-19 — End: 2015-06-19
  Administered 2015-06-19: 1 [drp] via OPHTHALMIC
  Filled 2015-06-19: qty 3

## 2015-06-19 MED ORDER — POVIDONE-IODINE 5 % OP SOLN
OPHTHALMIC | Status: AC
  Filled 2015-06-19: qty 30

## 2015-06-19 MED ORDER — PHENYLEPHRINE HCL 10 % OP SOLN
OPHTHALMIC | Status: AC
  Filled 2015-06-19: qty 5

## 2015-06-19 MED ORDER — SODIUM CHLORIDE 0.9 % IV SOLN
INTRAVENOUS | Status: DC
  Administered 2015-06-19: 08:00:00 via INTRAVENOUS
  Administered 2015-06-19: 50 mL/h via INTRAVENOUS

## 2015-06-19 MED ORDER — PHENYLEPHRINE HCL 10 % OP SOLN
1.0000 [drp] | OPHTHALMIC | Status: AC | PRN
  Administered 2015-06-19 (×2): 1 [drp] via OPHTHALMIC
  Administered 2015-06-19: 07:00:00 via OPHTHALMIC
  Administered 2015-06-19: 1 [drp] via OPHTHALMIC

## 2015-06-19 MED ORDER — ALFENTANIL 500 MCG/ML IJ INJ
INJECTION | INTRAMUSCULAR | Status: DC | PRN
  Administered 2015-06-19: 500 ug via INTRAVENOUS

## 2015-06-19 MED ORDER — EPINEPHRINE HCL 1 MG/ML IJ SOLN
INTRAMUSCULAR | Status: AC
  Filled 2015-06-19: qty 2

## 2015-06-19 MED ORDER — EPINEPHRINE HCL 1 MG/ML IJ SOLN
INTRAOCULAR | Status: DC | PRN
  Administered 2015-06-19: 1 mL via OPHTHALMIC

## 2015-06-19 SURGICAL SUPPLY — 30 items
CANNULA ANT/CHMB 27GA (MISCELLANEOUS) ×3 IMPLANT
CORD BIP STRL DISP 12FT (MISCELLANEOUS) ×3 IMPLANT
CUP MEDICINE 2OZ PLAST GRAD ST (MISCELLANEOUS) ×3 IMPLANT
DRAPE XRAY CASSETTE 23X24 (DRAPES) ×3 IMPLANT
ERASER HMR WETFIELD 18G (MISCELLANEOUS) ×3 IMPLANT
GLOVE BIO SURGEON STRL SZ8 (GLOVE) ×3 IMPLANT
GLOVE SURG LX 6.5 MICRO (GLOVE) ×2
GLOVE SURG LX 8.0 MICRO (GLOVE) ×2
GLOVE SURG LX STRL 6.5 MICRO (GLOVE) ×1 IMPLANT
GLOVE SURG LX STRL 8.0 MICRO (GLOVE) ×1 IMPLANT
GOWN STRL REUS W/ TWL LRG LVL3 (GOWN DISPOSABLE) ×1 IMPLANT
GOWN STRL REUS W/ TWL XL LVL3 (GOWN DISPOSABLE) ×1 IMPLANT
GOWN STRL REUS W/TWL LRG LVL3 (GOWN DISPOSABLE) ×2
GOWN STRL REUS W/TWL XL LVL3 (GOWN DISPOSABLE) ×2
LENS IOL ACRYSOF IQ 20.5 (Intraocular Lens) ×3 IMPLANT
PACK CATARACT (MISCELLANEOUS) ×3 IMPLANT
PACK CATARACT DINGLEDEIN LX (MISCELLANEOUS) ×3 IMPLANT
PACK EYE AFTER SURG (MISCELLANEOUS) ×3 IMPLANT
SHLD EYE VISITEC  UNIV (MISCELLANEOUS) ×3 IMPLANT
SOL BSS BAG (MISCELLANEOUS) ×3
SOL PREP PVP 2OZ (MISCELLANEOUS) ×3
SOLUTION BSS BAG (MISCELLANEOUS) ×1 IMPLANT
SOLUTION PREP PVP 2OZ (MISCELLANEOUS) ×1 IMPLANT
SUT ETHILON 10 0 CS140 6 (SUTURE) ×3 IMPLANT
SUT SILK 5-0 (SUTURE) ×3 IMPLANT
SYR 3ML LL SCALE MARK (SYRINGE) ×3 IMPLANT
SYR 5ML LL (SYRINGE) ×3 IMPLANT
SYR TB 1ML 27GX1/2 LL (SYRINGE) ×3 IMPLANT
WATER STERILE IRR 1000ML POUR (IV SOLUTION) ×3 IMPLANT
WIPE NON LINTING 3.25X3.25 (MISCELLANEOUS) ×3 IMPLANT

## 2015-06-19 NOTE — H&P (Signed)
See scanned note.

## 2015-06-19 NOTE — Discharge Instructions (Signed)
See hand out.

## 2015-06-19 NOTE — Anesthesia Preprocedure Evaluation (Addendum)
Anesthesia Evaluation  Patient identified by MRN, date of birth, ID band Patient awake    Reviewed: Allergy & Precautions, NPO status , Patient's Chart, lab work & pertinent test results  Airway Mallampati: I  TM Distance: >3 FB Neck ROM: Full    Dental  (+) Partial Lower, Partial Upper   Pulmonary    Pulmonary exam normal        Cardiovascular Exercise Tolerance: Good + dysrhythmias  Rhythm:Regular Rate:Bradycardia  LBBB and bradycardia   Neuro/Psych Hx of bell's palsy Hard of hearing    GI/Hepatic GERD  Medicated and Controlled,diverticulitis   Endo/Other    Renal/GU Renal stones     Musculoskeletal  (+) Arthritis , Osteoarthritis,    Abdominal (+)  Abdomen: soft.    Peds  Hematology Hx of CLL   Anesthesia Other Findings Hx of bradycardia and LBBB CLL  Reproductive/Obstetrics                          Anesthesia Physical  Anesthesia Plan  ASA: III  Anesthesia Plan: MAC   Post-op Pain Management:    Induction:   Airway Management Planned: Nasal Cannula  Additional Equipment:   Intra-op Plan:   Post-operative Plan:   Informed Consent: I have reviewed the patients History and Physical, chart, labs and discussed the procedure including the risks, benefits and alternatives for the proposed anesthesia with the patient or authorized representative who has indicated his/her understanding and acceptance.     Plan Discussed with: CRNA  Anesthesia Plan Comments:         Anesthesia Quick Evaluation

## 2015-06-19 NOTE — Transfer of Care (Signed)
Immediate Anesthesia Transfer of Care Note  Patient: Christopher Daniels  Procedure(s) Performed: Procedure(s) with comments: CATARACT EXTRACTION PHACO AND INTRAOCULAR LENS PLACEMENT ([facility]) (Left) - [place] 02:36.5 AP% 27.7 CDE 69.51 fluid pack lot # [id number]  Patient Location: PACU  Anesthesia Type:General  Level of Consciousness: awake, alert  and oriented  Airway & Oxygen Therapy: Patient Spontanous Breathing  Post-op Assessment: Report given to RN and Post -op Vital signs reviewed and stable  Post vital signs: Reviewed and stable  Last Vitals:  Filed Vitals:   06/19/15 0657 06/19/15 0907  BP: 114/74 141/82  Pulse: 37 89  Temp: 36.1 C 36.6 C  Resp: 16 16    Complications: No apparent anesthesia complications

## 2015-06-19 NOTE — Op Note (Signed)
Date of Surgery: 06/19/2015 Date of Dictation: 06/19/2015 9:06 AM Pre-operative Diagnosis:  Nuclear Sclerotic Cataract left Eye Post-operative Diagnosis: same Procedure performed: Extra-capsular Cataract Extraction (ECCE) with placement of a posterior chamber intraocular lens (IOL) left Eye IOL:  Implant Name Type Inv. Item Serial No. Manufacturer Lot No. LRB No. Used  LENS IOL ACRYSOF IQ 20.5 - [id number] Intraocular Lens LENS IOL ACRYSOF IQ 20.5 [id number] ALCON [id number] Left 1   Anesthesia: 2% Lidocaine and 4% Marcaine in a 50/50 mixture with 10 unites/ml of Hylenex given as a peribulbar Anesthesiologist: Anesthesiologist: Alvin Critchley, MD CRNA: Rolla Plate, CRNA; Jenetta Downer, CRNA Complications: none Estimated Blood Loss: less than 1 ml  Description of procedure:  The patient was given anesthesia and sedation via intravenous access. The patient was then prepped and draped in the usual fashion. A 25-gauge needle was bent for initiating the capsulorhexis. A 5-0 silk suture was placed through the conjunctiva superior and inferiorly to serve as bridle sutures. Hemostasis was obtained at the superior limbus using an eraser cautery. A partial thickness groove was made at the anterior surgical limbus with a 64 Beaver blade and this was dissected anteriorly with an Avaya. The anterior chamber was entered at 10 o'clock with a 1.0 mm paracentesis knife and through the lamellar dissection with a 2.6 mm Alcon keratome. Epi-Shugarcaine 0.5 CC [9 cc BSS Plus (Alcon), 3 cc 4% preservative-free lidocaine (Hospira) and 4 cc 1:1000 preservative-free, bisulfite-free epinephrine] was injected into the anterior chamber via the paracentesis tract. Epi-Shugarcaine 0.5 CC [9 cc BSS Plus (Alcon), 3 cc 4% preservative-free lidocaine (Hospira) and 4 cc 1:1000 preservative-free, bisulfite-free epinephrine] was injected into the anterior chamber via the paracentesis tract. DiscoVisc was  injected to replace the aqueous and a continuous tear curvilinear capsulorhexis was performed using a bent 25-gauge needle.  Balance salt on a syringe was used to perform hydro-dissection and phacoemulsification was carried out using a divide and conquer technique. Procedure(s) with comments: CATARACT EXTRACTION PHACO AND INTRAOCULAR LENS PLACEMENT ([facility]) (Left) - [place] 02:36.5 AP% 27.7 CDE 69.51 fluid pack lot # [id number]. Irrigation/aspiration was used to remove the residual cortex and the capsular bag was inflated with DiscoVisc. The intraocular lens was inserted into the capsular bag using a pre-loaded UltraSert Delivery System. Irrigation/aspiration was used to remove the residual DiscoVisc. The wound was inflated with balanced salt and checked for leaks. None were found. Miostat was injected via the paracentesis track and 0.1 ml of cefuroxime containing 1 mg of drug  was injected via the paracentesis track. The wound was checked for leaks again and none were found.   The bridal sutures were removed and two drops of Vigamox were placed on the eye. An eye shield was placed to protect the eye and the patient was discharged to the recovery area in good condition.   Cathline Dowen MD

## 2015-06-19 NOTE — Interval H&P Note (Signed)
History and Physical Interval Note:  06/19/2015 7:23 AM  Christopher Daniels  has presented today for surgery, with the diagnosis of CATARACT  The various methods of treatment have been discussed with the patient and family. After consideration of risks, benefits and other options for treatment, the patient has consented to  Procedure(s): CATARACT EXTRACTION PHACO AND INTRAOCULAR LENS PLACEMENT ([place]) (Left) as a surgical intervention .  The patient's history has been reviewed, patient examined, no change in status, stable for surgery.  I have reviewed the patient's chart and labs.  Questions were answered to the patient's satisfaction.     Kassidie Hendriks

## 2015-06-20 NOTE — Anesthesia Postprocedure Evaluation (Signed)
Anesthesia Post Note  Patient: Christopher Daniels  Procedure(s) Performed: Procedure(s) (LRB): CATARACT EXTRACTION PHACO AND INTRAOCULAR LENS PLACEMENT (IOC) (Left)  Patient location during evaluation: PACU Anesthesia Type: General Level of consciousness: awake and alert and oriented Pain management: pain level controlled Vital Signs Assessment: post-procedure vital signs reviewed and stable Respiratory status: spontaneous breathing Cardiovascular status: blood pressure returned to baseline Anesthetic complications: no    Last Vitals:  Filed Vitals:   06/19/15 0907 06/19/15 0928  BP: 141/82 150/81  Pulse: 89 77  Temp: 36.6 C   Resp: 16 16    Last Pain:  Filed Vitals:   06/20/15 0844  PainSc: 0-No pain                 Yahmir Sokolov

## 2015-07-12 DIAGNOSIS — Z961 Presence of intraocular lens: Secondary | ICD-10-CM | POA: Diagnosis not present

## 2015-08-09 DIAGNOSIS — I2510 Atherosclerotic heart disease of native coronary artery without angina pectoris: Secondary | ICD-10-CM | POA: Diagnosis not present

## 2015-08-09 DIAGNOSIS — E782 Mixed hyperlipidemia: Secondary | ICD-10-CM | POA: Diagnosis not present

## 2015-08-09 DIAGNOSIS — I447 Left bundle-branch block, unspecified: Secondary | ICD-10-CM | POA: Diagnosis not present

## 2015-08-17 DIAGNOSIS — E785 Hyperlipidemia, unspecified: Secondary | ICD-10-CM | POA: Diagnosis not present

## 2015-10-12 DIAGNOSIS — Z08 Encounter for follow-up examination after completed treatment for malignant neoplasm: Secondary | ICD-10-CM | POA: Diagnosis not present

## 2015-10-12 DIAGNOSIS — D485 Neoplasm of uncertain behavior of skin: Secondary | ICD-10-CM | POA: Diagnosis not present

## 2015-10-12 DIAGNOSIS — B078 Other viral warts: Secondary | ICD-10-CM | POA: Diagnosis not present

## 2015-10-12 DIAGNOSIS — L570 Actinic keratosis: Secondary | ICD-10-CM | POA: Diagnosis not present

## 2015-10-12 DIAGNOSIS — X32XXXA Exposure to sunlight, initial encounter: Secondary | ICD-10-CM | POA: Diagnosis not present

## 2015-10-12 DIAGNOSIS — Z85828 Personal history of other malignant neoplasm of skin: Secondary | ICD-10-CM | POA: Diagnosis not present

## 2015-10-12 DIAGNOSIS — B079 Viral wart, unspecified: Secondary | ICD-10-CM | POA: Diagnosis not present

## 2015-11-07 DIAGNOSIS — M2012 Hallux valgus (acquired), left foot: Secondary | ICD-10-CM | POA: Diagnosis not present

## 2015-11-07 DIAGNOSIS — M79672 Pain in left foot: Secondary | ICD-10-CM | POA: Diagnosis not present

## 2015-11-07 DIAGNOSIS — M2042 Other hammer toe(s) (acquired), left foot: Secondary | ICD-10-CM | POA: Diagnosis not present

## 2015-11-07 DIAGNOSIS — D2372 Other benign neoplasm of skin of left lower limb, including hip: Secondary | ICD-10-CM | POA: Diagnosis not present

## 2015-11-28 DIAGNOSIS — D2372 Other benign neoplasm of skin of left lower limb, including hip: Secondary | ICD-10-CM | POA: Diagnosis not present

## 2015-11-28 DIAGNOSIS — M79672 Pain in left foot: Secondary | ICD-10-CM | POA: Diagnosis not present

## 2015-11-28 DIAGNOSIS — M2012 Hallux valgus (acquired), left foot: Secondary | ICD-10-CM | POA: Diagnosis not present

## 2015-12-04 DIAGNOSIS — Z23 Encounter for immunization: Secondary | ICD-10-CM | POA: Diagnosis not present

## 2015-12-18 ENCOUNTER — Inpatient Hospital Stay: Payer: PPO | Attending: Oncology

## 2015-12-18 DIAGNOSIS — C9110 Chronic lymphocytic leukemia of B-cell type not having achieved remission: Secondary | ICD-10-CM | POA: Diagnosis not present

## 2015-12-18 LAB — CBC WITH DIFFERENTIAL/PLATELET
Basophils Absolute: 0.1 10*3/uL (ref 0–0.1)
Basophils Relative: 0 %
Eosinophils Absolute: 0.1 10*3/uL (ref 0–0.7)
Eosinophils Relative: 1 %
HCT: 40.7 % (ref 40.0–52.0)
Hemoglobin: 14 g/dL (ref 13.0–18.0)
Lymphocytes Relative: 78 %
Lymphs Abs: 12.3 10*3/uL — ABNORMAL HIGH (ref 1.0–3.6)
MCH: 32.1 pg (ref 26.0–34.0)
MCHC: 34.4 g/dL (ref 32.0–36.0)
MCV: 93.4 fL (ref 80.0–100.0)
Monocytes Absolute: 0.6 10*3/uL (ref 0.2–1.0)
Monocytes Relative: 4 %
Neutro Abs: 2.6 10*3/uL (ref 1.4–6.5)
Neutrophils Relative %: 17 %
Platelets: 197 10*3/uL (ref 150–440)
RBC: 4.36 MIL/uL — ABNORMAL LOW (ref 4.40–5.90)
RDW: 13.9 % (ref 11.5–14.5)
WBC: 15.6 10*3/uL — ABNORMAL HIGH (ref 3.8–10.6)

## 2015-12-21 ENCOUNTER — Encounter: Payer: Self-pay | Admitting: Oncology

## 2016-01-22 DIAGNOSIS — M79672 Pain in left foot: Secondary | ICD-10-CM | POA: Diagnosis not present

## 2016-01-22 DIAGNOSIS — D2372 Other benign neoplasm of skin of left lower limb, including hip: Secondary | ICD-10-CM | POA: Diagnosis not present

## 2016-01-22 DIAGNOSIS — M79671 Pain in right foot: Secondary | ICD-10-CM | POA: Diagnosis not present

## 2016-01-27 ENCOUNTER — Inpatient Hospital Stay (HOSPITAL_COMMUNITY)
Admission: EM | Admit: 2016-01-27 | Discharge: 2016-01-29 | DRG: 247 | Disposition: A | Discharge status: Home / Self Care | Payer: PPO | Attending: Cardiology | Admitting: Cardiology

## 2016-01-27 ENCOUNTER — Encounter (HOSPITAL_COMMUNITY)
Admission: EM | Disposition: A | Discharge status: Home / Self Care | Payer: Self-pay | Source: Home / Self Care | Attending: Cardiology

## 2016-01-27 ENCOUNTER — Encounter (HOSPITAL_COMMUNITY): Payer: Self-pay | Admitting: *Deleted

## 2016-01-27 ENCOUNTER — Inpatient Hospital Stay (HOSPITAL_COMMUNITY): Payer: PPO

## 2016-01-27 DIAGNOSIS — I10 Essential (primary) hypertension: Secondary | ICD-10-CM | POA: Diagnosis not present

## 2016-01-27 DIAGNOSIS — R001 Bradycardia, unspecified: Secondary | ICD-10-CM | POA: Diagnosis present

## 2016-01-27 DIAGNOSIS — I472 Ventricular tachycardia: Secondary | ICD-10-CM | POA: Diagnosis not present

## 2016-01-27 DIAGNOSIS — Z9841 Cataract extraction status, right eye: Secondary | ICD-10-CM

## 2016-01-27 DIAGNOSIS — K219 Gastro-esophageal reflux disease without esophagitis: Secondary | ICD-10-CM | POA: Diagnosis not present

## 2016-01-27 DIAGNOSIS — I2119 ST elevation (STEMI) myocardial infarction involving other coronary artery of inferior wall: Secondary | ICD-10-CM | POA: Diagnosis not present

## 2016-01-27 DIAGNOSIS — I213 ST elevation (STEMI) myocardial infarction of unspecified site: Secondary | ICD-10-CM

## 2016-01-27 DIAGNOSIS — I219 Acute myocardial infarction, unspecified: Secondary | ICD-10-CM

## 2016-01-27 DIAGNOSIS — Z9842 Cataract extraction status, left eye: Secondary | ICD-10-CM | POA: Diagnosis not present

## 2016-01-27 DIAGNOSIS — Z955 Presence of coronary angioplasty implant and graft: Secondary | ICD-10-CM

## 2016-01-27 DIAGNOSIS — Z79899 Other long term (current) drug therapy: Secondary | ICD-10-CM

## 2016-01-27 DIAGNOSIS — Z961 Presence of intraocular lens: Secondary | ICD-10-CM | POA: Diagnosis not present

## 2016-01-27 DIAGNOSIS — I200 Unstable angina: Secondary | ICD-10-CM | POA: Diagnosis not present

## 2016-01-27 DIAGNOSIS — Z7982 Long term (current) use of aspirin: Secondary | ICD-10-CM

## 2016-01-27 DIAGNOSIS — E785 Hyperlipidemia, unspecified: Secondary | ICD-10-CM | POA: Diagnosis present

## 2016-01-27 DIAGNOSIS — I2510 Atherosclerotic heart disease of native coronary artery without angina pectoris: Secondary | ICD-10-CM | POA: Diagnosis not present

## 2016-01-27 DIAGNOSIS — Z856 Personal history of leukemia: Secondary | ICD-10-CM

## 2016-01-27 DIAGNOSIS — M1990 Unspecified osteoarthritis, unspecified site: Secondary | ICD-10-CM | POA: Diagnosis not present

## 2016-01-27 DIAGNOSIS — I442 Atrioventricular block, complete: Secondary | ICD-10-CM | POA: Diagnosis present

## 2016-01-27 DIAGNOSIS — R739 Hyperglycemia, unspecified: Secondary | ICD-10-CM | POA: Diagnosis present

## 2016-01-27 DIAGNOSIS — R079 Chest pain, unspecified: Secondary | ICD-10-CM | POA: Diagnosis not present

## 2016-01-27 DIAGNOSIS — I255 Ischemic cardiomyopathy: Secondary | ICD-10-CM | POA: Diagnosis not present

## 2016-01-27 HISTORY — DX: Acute myocardial infarction, unspecified: I21.9

## 2016-01-27 HISTORY — PX: CARDIAC CATHETERIZATION: SHX172

## 2016-01-27 LAB — LIPID PANEL
CHOLESTEROL: 138 mg/dL (ref 0–200)
Cholesterol: 141 mg/dL (ref 0–200)
HDL: 29 mg/dL — ABNORMAL LOW (ref >40)
HDL: 32 mg/dL — AB (ref >40)
LDL Cholesterol: 90 mg/dL (ref 0–99)
LDL Cholesterol: 103 mg/dL — ABNORMAL HIGH (ref 0–99)
Total CHOL/HDL Ratio: 4.8 RATIO
Total CHOL/HDL Ratio: 4.4 RATIO
Triglycerides: 93 mg/dL (ref <150)
Triglycerides: 32 mg/dL (ref <150)
VLDL: 19 mg/dL (ref 0–40)
VLDL: 6 mg/dL (ref 0–40)

## 2016-01-27 LAB — PROTIME-INR
INR: 1.11
PROTHROMBIN TIME: 14.4 s (ref 11.4–15.2)

## 2016-01-27 LAB — COMPREHENSIVE METABOLIC PANEL
ALK PHOS: 53 U/L (ref 38–126)
ALT: 17 U/L (ref 17–63)
AST: 21 U/L (ref 15–41)
Albumin: 3.4 g/dL — ABNORMAL LOW (ref 3.5–5.0)
Anion gap: 8 (ref 5–15)
BILIRUBIN TOTAL: 0.7 mg/dL (ref 0.3–1.2)
BUN: 12 mg/dL (ref 6–20)
CO2: 24 mmol/L (ref 22–32)
CREATININE: 1.09 mg/dL (ref 0.61–1.24)
Calcium: 8.3 mg/dL — ABNORMAL LOW (ref 8.9–10.3)
Chloride: 106 mmol/L (ref 101–111)
GFR calc Af Amer: >60 mL/min (ref >60)
Glucose, Bld: 128 mg/dL — ABNORMAL HIGH (ref 65–99)
Potassium: 3.9 mmol/L (ref 3.5–5.1)
Sodium: 138 mmol/L (ref 135–145)
TOTAL PROTEIN: 5.6 g/dL — AB (ref 6.5–8.1)

## 2016-01-27 LAB — TSH: TSH: 0.947 u[IU]/mL (ref 0.350–4.500)

## 2016-01-27 LAB — CBC
HCT: 39.2 % (ref 39.0–52.0)
HEMOGLOBIN: 13.3 g/dL (ref 13.0–17.0)
MCH: 31.9 pg (ref 26.0–34.0)
MCHC: 33.9 g/dL (ref 30.0–36.0)
MCV: 94 fL (ref 78.0–100.0)
Platelets: 208 10*3/uL (ref 150–400)
RBC: 4.17 MIL/uL — AB (ref 4.22–5.81)
RDW: 13.5 % (ref 11.5–15.5)
WBC: 20.3 10*3/uL — AB (ref 4.0–10.5)

## 2016-01-27 LAB — APTT: APTT: 30 s (ref 24–36)

## 2016-01-27 LAB — ECHOCARDIOGRAM COMPLETE
HEIGHTINCHES: 70.866 in
WEIGHTICAEL: 3283.97 [oz_av]

## 2016-01-27 LAB — TROPONIN I

## 2016-01-27 LAB — MRSA PCR SCREENING: MRSA BY PCR: NEGATIVE

## 2016-01-27 SURGERY — LEFT HEART CATH AND CORONARY ANGIOGRAPHY
Anesthesia: General

## 2016-01-27 MED ORDER — HEPARIN SODIUM (PORCINE) 1000 UNIT/ML IJ SOLN
INTRAMUSCULAR | Status: AC
  Filled 2016-01-27: qty 1

## 2016-01-27 MED ORDER — LABETALOL HCL 5 MG/ML IV SOLN: 10.0000 mg | INTRAVENOUS | Status: AC | PRN

## 2016-01-27 MED ORDER — ADENOSINE 12 MG/4ML IV SOLN
INTRAVENOUS | Status: AC
  Filled 2016-01-27: qty 4

## 2016-01-27 MED ORDER — NITROGLYCERIN 2 % TD OINT
1.0000 [in_us] | TOPICAL_OINTMENT | Freq: Three times a day (TID) | TRANSDERMAL | Status: DC
  Administered 2016-01-27 – 2016-01-29 (×6): 1 [in_us] via TOPICAL
  Filled 2016-01-27: qty 30

## 2016-01-27 MED ORDER — IOPAMIDOL (ISOVUE-370) INJECTION 76%
INTRAVENOUS | Status: AC
  Filled 2016-01-27: qty 50

## 2016-01-27 MED ORDER — HEPARIN (PORCINE) IN NACL 2-0.9 UNIT/ML-% IJ SOLN
INTRAMUSCULAR | Status: AC
  Filled 2016-01-27: qty 1500

## 2016-01-27 MED ORDER — ATORVASTATIN CALCIUM 80 MG PO TABS
80.0000 mg | ORAL_TABLET | Freq: Every day | ORAL | Status: DC
  Administered 2016-01-27 – 2016-01-28 (×2): 80 mg via ORAL
  Filled 2016-01-27 (×2): qty 1

## 2016-01-27 MED ORDER — PERFLUTREN LIPID MICROSPHERE
1.0000 mL | INTRAVENOUS | Status: AC | PRN
  Administered 2016-01-27: 2 mL via INTRAVENOUS
  Filled 2016-01-27: qty 10

## 2016-01-27 MED ORDER — NITROGLYCERIN 1 MG/10 ML FOR IR/CATH LAB
INTRA_ARTERIAL | Status: AC
  Filled 2016-01-27: qty 10

## 2016-01-27 MED ORDER — METOPROLOL TARTRATE 12.5 MG HALF TABLET
12.5000 mg | ORAL_TABLET | Freq: Two times a day (BID) | ORAL | Status: DC
  Administered 2016-01-27 – 2016-01-28 (×3): 12.5 mg via ORAL
  Filled 2016-01-27: qty 1

## 2016-01-27 MED ORDER — ASPIRIN EC 81 MG PO TBEC
81.0000 mg | DELAYED_RELEASE_TABLET | Freq: Every day | ORAL | Status: DC
  Administered 2016-01-28 – 2016-01-29 (×2): 81 mg via ORAL
  Filled 2016-01-27 (×2): qty 1

## 2016-01-27 MED ORDER — CANGRELOR TETRASODIUM 50 MG IV SOLR
INTRAVENOUS | Status: AC
  Filled 2016-01-27: qty 50

## 2016-01-27 MED ORDER — MORPHINE SULFATE (PF) 2 MG/ML IV SOLN
2.0000 mg | INTRAVENOUS | Status: DC | PRN
  Administered 2016-01-28: 2 mg via INTRAVENOUS

## 2016-01-27 MED ORDER — ADENOSINE (DIAGNOSTIC) FOR INTRACORONARY USE
INTRAVENOUS | Status: DC | PRN
  Administered 2016-01-27: 100 ug via INTRACORONARY
  Administered 2016-01-27: 60 ug via INTRACORONARY

## 2016-01-27 MED ORDER — HEPARIN (PORCINE) IN NACL 2-0.9 UNIT/ML-% IJ SOLN
INTRAMUSCULAR | Status: DC | PRN
  Administered 2016-01-27: 1500 mL

## 2016-01-27 MED ORDER — NITROGLYCERIN 1 MG/10 ML FOR IR/CATH LAB
INTRA_ARTERIAL | Status: DC | PRN
  Administered 2016-01-27: 200 ug

## 2016-01-27 MED ORDER — HYDRALAZINE HCL 20 MG/ML IJ SOLN: 5.0000 mg | INTRAMUSCULAR | Status: AC | PRN

## 2016-01-27 MED ORDER — FENTANYL CITRATE (PF) 100 MCG/2ML IJ SOLN
INTRAMUSCULAR | Status: AC
  Filled 2016-01-27: qty 2

## 2016-01-27 MED ORDER — SODIUM CHLORIDE 0.9 % IV SOLN
4.0000 ug/kg/min | INTRAVENOUS | Status: AC
  Administered 2016-01-27 (×2): 4 ug/kg/min via INTRAVENOUS
  Filled 2016-01-27 (×2): qty 50

## 2016-01-27 MED ORDER — HEPARIN SODIUM (PORCINE) 1000 UNIT/ML IJ SOLN
INTRAMUSCULAR | Status: DC | PRN
  Administered 2016-01-27: 1500 [IU] via INTRAVENOUS
  Administered 2016-01-27: 6000 [IU] via INTRAVENOUS
  Administered 2016-01-27: 4000 [IU] via INTRAVENOUS

## 2016-01-27 MED ORDER — ONDANSETRON HCL 4 MG/2ML IJ SOLN
4.0000 mg | Freq: Four times a day (QID) | INTRAMUSCULAR | Status: DC | PRN
  Administered 2016-01-28: 4 mg via INTRAVENOUS

## 2016-01-27 MED ORDER — FENTANYL CITRATE (PF) 100 MCG/2ML IJ SOLN
INTRAMUSCULAR | Status: DC | PRN
  Administered 2016-01-27: 25 ug via INTRAVENOUS
  Administered 2016-01-27: 50 ug via INTRAVENOUS

## 2016-01-27 MED ORDER — DIAZEPAM 5 MG PO TABS: 5.0000 mg | ORAL_TABLET | ORAL | Status: DC | PRN

## 2016-01-27 MED ORDER — IOPAMIDOL (ISOVUE-370) INJECTION 76%
INTRAVENOUS | Status: AC
  Filled 2016-01-27: qty 125

## 2016-01-27 MED ORDER — SODIUM CHLORIDE 0.9% FLUSH: 3.0000 mL | INTRAVENOUS | Status: DC | PRN

## 2016-01-27 MED ORDER — NITROGLYCERIN 0.4 MG SL SUBL: 0.4000 mg | SUBLINGUAL_TABLET | SUBLINGUAL | Status: DC | PRN

## 2016-01-27 MED ORDER — TICAGRELOR 90 MG PO TABS
90.0000 mg | ORAL_TABLET | Freq: Two times a day (BID) | ORAL | Status: DC
  Administered 2016-01-27 – 2016-01-29 (×4): 90 mg via ORAL
  Filled 2016-01-27 (×2): qty 1

## 2016-01-27 MED ORDER — SODIUM CHLORIDE 0.9 % IV SOLN
INTRAVENOUS | Status: AC
  Administered 2016-01-27: 75 mL/h via INTRAVENOUS

## 2016-01-27 MED ORDER — SODIUM CHLORIDE 0.9% FLUSH
3.0000 mL | Freq: Two times a day (BID) | INTRAVENOUS | Status: DC
  Administered 2016-01-28 (×2): 3 mL via INTRAVENOUS

## 2016-01-27 MED ORDER — SODIUM CHLORIDE 0.9 % IV SOLN
INTRAVENOUS | Status: DC | PRN
  Administered 2016-01-27: 4 ug/kg/min via INTRAVENOUS

## 2016-01-27 MED ORDER — IOPAMIDOL (ISOVUE-370) INJECTION 76%
INTRAVENOUS | Status: DC | PRN
  Administered 2016-01-27: 155 mL via INTRA_ARTERIAL

## 2016-01-27 MED ORDER — CANGRELOR BOLUS VIA INFUSION
INTRAVENOUS | Status: DC | PRN
  Administered 2016-01-27: 2700 ug via INTRAVENOUS

## 2016-01-27 MED ORDER — NITROGLYCERIN IN D5W 200-5 MCG/ML-% IV SOLN
INTRAVENOUS | Status: AC
  Filled 2016-01-27: qty 250

## 2016-01-27 MED ORDER — HEPARIN SODIUM (PORCINE) 5000 UNIT/ML IJ SOLN
5000.0000 [IU] | Freq: Three times a day (TID) | INTRAMUSCULAR | Status: DC
  Administered 2016-01-27 – 2016-01-29 (×6): 5000 [IU] via SUBCUTANEOUS
  Filled 2016-01-27 (×4): qty 1

## 2016-01-27 MED ORDER — TICAGRELOR 90 MG PO TABS
ORAL_TABLET | ORAL | Status: DC | PRN
  Administered 2016-01-27: 180 mg via ORAL

## 2016-01-27 MED ORDER — TICAGRELOR 90 MG PO TABS
ORAL_TABLET | ORAL | Status: AC
  Filled 2016-01-27: qty 2

## 2016-01-27 MED ORDER — LIDOCAINE HCL (PF) 1 % IJ SOLN
INTRAMUSCULAR | Status: DC | PRN
  Administered 2016-01-27: 10 mL

## 2016-01-27 MED ORDER — NITROGLYCERIN IN D5W 200-5 MCG/ML-% IV SOLN
INTRAVENOUS | Status: DC | PRN
  Administered 2016-01-27: 20 ug/min via INTRAVENOUS

## 2016-01-27 MED ORDER — LIDOCAINE HCL (PF) 1 % IJ SOLN
INTRAMUSCULAR | Status: AC
  Filled 2016-01-27: qty 30

## 2016-01-27 MED ORDER — ACETAMINOPHEN 325 MG PO TABS
650.0000 mg | ORAL_TABLET | ORAL | Status: DC | PRN
  Administered 2016-01-28: 650 mg via ORAL

## 2016-01-27 MED ORDER — SODIUM CHLORIDE 0.9 % IV SOLN: 250.0000 mL | INTRAVENOUS | Status: DC | PRN

## 2016-01-27 SURGICAL SUPPLY — 19 items
BALLN EMERGE MR 4.0X20 (BALLOONS) ×3
BALLOON EMERGE MR 4.0X20 (BALLOONS) ×1 IMPLANT
CABLE ADAPT CONN TEMP 6FT (ADAPTER) ×3 IMPLANT
CATH EXPO 5F FL5 (CATHETERS) ×3 IMPLANT
CATH EXTRAC PRONTO 5.5F 138CM (CATHETERS) ×3 IMPLANT
CATH INFINITI 5FR MULTPACK ANG (CATHETERS) ×3 IMPLANT
CATH S G BIP PACING (SET/KITS/TRAYS/PACK) ×3 IMPLANT
CATH VISTA GUIDE 6FR JR4 (CATHETERS) ×3 IMPLANT
DEVICE CLOSURE PERCLS PRGLD 6F (VASCULAR PRODUCTS) ×1 IMPLANT
KIT ENCORE 26 ADVANTAGE (KITS) ×3 IMPLANT
KIT HEART LEFT (KITS) ×3 IMPLANT
PACK CARDIAC CATHETERIZATION (CUSTOM PROCEDURE TRAY) ×3 IMPLANT
PERCLOSE PROGLIDE 6F (VASCULAR PRODUCTS) ×3
SET INTRODUCER MICROPUNCT 5F (INTRODUCER) ×3 IMPLANT
SHEATH PINNACLE 6F 10CM (SHEATH) ×6 IMPLANT
STENT RESOLUTE INTEG 4.0X30 (Permanent Stent) ×3 IMPLANT
TRANSDUCER W/STOPCOCK (MISCELLANEOUS) ×3 IMPLANT
TUBING CIL FLEX 10 FLL-RA (TUBING) ×3 IMPLANT
WIRE RUNTHROUGH .014X180CM (WIRE) ×3 IMPLANT

## 2016-01-27 NOTE — H&P (Addendum)
Christopher Daniels is an 81 y.o. male.   Chief Complaint: Chest pain HPI: Patient has history of hyperlipidemia, chronic lymphatic leukemia and history of left bundle branch block. He has had coronary angiography on 07/29/2014 and was told to have mild disease.  He had been doing well until about a week ago started noticing jaw pain and shoulder pain with exertional activity. However he noted the symptoms, this morning he woke up around 7 AM and started complaining of chest pain which was very severe and advised his wife to call the EMS. This was at least an hour and a half after he started having chest pain. He was emergently brought to the cardiac cath is lab with abnormal EKG with a STEMI on board with complete heart block.  Patient has history of bradycardia in the past, was told to have a left bundle branch block.  Past Medical History:  Diagnosis Date  . Arthritis    joints  . Cancer ([place])    skin/CLL. on head years ago  . Diverticulitis   . Dysrhythmia    bradycardia  . GERD (gastroesophageal reflux disease)   . H/O Bell's palsy   . HOH (hard of hearing)   . Kidney calculi    stones from years ago  . LBBB (left bundle branch block)     Past Surgical History:  Procedure Laterality Date  . BACK SURGERY  1983   no metal  . CARDIAC CATHETERIZATION N/A 07/29/2014   Procedure: Left Heart Cath;  Surgeon: Teodoro Spray, MD;  Location: [place] [facility];  Service: Cardiovascular;  Laterality: N/A;  . CATARACT EXTRACTION W/PHACO Right 07/18/2014   Procedure: CATARACT EXTRACTION PHACO AND INTRAOCULAR LENS PLACEMENT (IOC);  Surgeon: Estill Cotta, MD;  Location: [facility] ORS;  Service: Ophthalmology;  Laterality: Right;  [place] 03:11 AP% 28.4 CDE 75.64  . CATARACT EXTRACTION W/PHACO Left 06/19/2015   Procedure: CATARACT EXTRACTION PHACO AND INTRAOCULAR LENS PLACEMENT (IOC);  Surgeon: Estill Cotta, MD;  Location: [facility] ORS;  Service: Ophthalmology;  Laterality: Left;  [place] 02:36.5 AP%  27.7 CDE 69.51 fluid pack lot # [id number]  . HERNIA REPAIR Left 2010   inguinal hernia    Family history: There is no history of premature coronary artery disease or diabetes in the family.  Social History:  reports that he has never smoked. He does not have any smokeless tobacco history on file. He reports that he does not drink alcohol or use drugs.  Allergies:  Allergies  Allergen Reactions  . Niaspan [Niacin Er] Anaphylaxis    Episode happened 10 to 15 years ago.  Has not tried any other similar meds since    Medications Prior to Admission  Medication Sig Dispense Refill  . aspirin 81 MG tablet Take 81 mg by mouth daily.    Marland Kitchen atorvastatin (LIPITOR) 10 MG tablet Take 10 mg by mouth daily.    Marland Kitchen ibuprofen (ADVIL,MOTRIN) 200 MG tablet Take 200 mg by mouth every 6 (six) hours as needed.    Marland Kitchen omeprazole (PRILOSEC) 20 MG capsule Take 20 mg by mouth daily.      Results for orders placed or performed during the hospital encounter of 01/27/16 (from the past 48 hour(s))  Comprehensive metabolic panel     Status: Abnormal   Collection Time: 01/27/16 10:59 AM  Result Value Ref Range   Sodium 138 135 - 145 mmol/L   Potassium 3.9 3.5 - 5.1 mmol/L   Chloride 106 101 - 111 mmol/L   CO2  24 22 - 32 mmol/L   Glucose, Bld 128 (H) 65 - 99 mg/dL   BUN 12 6 - 20 mg/dL   Creatinine, Ser 1.09 0.61 - 1.24 mg/dL   Calcium 8.3 (L) 8.9 - 10.3 mg/dL   Total Protein 5.6 (L) 6.5 - 8.1 g/dL   Albumin 3.4 (L) 3.5 - 5.0 g/dL   AST 21 15 - 41 U/L   ALT 17 17 - 63 U/L   Alkaline Phosphatase 53 38 - 126 U/L   Total Bilirubin 0.7 0.3 - 1.2 mg/dL   GFR calc non Af Amer >60 >60 mL/min   GFR calc Af Amer >60 >60 mL/min    Comment: (NOTE) The eGFR has been calculated using the CKD EPI equation. This calculation has not been validated in all clinical situations. eGFR's persistently <60 mL/min signify possible Chronic Kidney Disease.    Anion gap 8 5 - 15  Lipid panel     Status: Abnormal   Collection  Time: 01/27/16 10:59 AM  Result Value Ref Range   Cholesterol 138 0 - 200 mg/dL   Triglycerides 93 <150 mg/dL   HDL 29 (L) >40 mg/dL   Total CHOL/HDL Ratio 4.8 RATIO   VLDL 19 0 - 40 mg/dL   LDL Cholesterol 90 0 - 99 mg/dL    Comment:        Total Cholesterol/HDL:CHD Risk Coronary Heart Disease Risk Table                     Men   Women  1/2 Average Risk   3.4   3.3  Average Risk       5.0   4.4  2 X Average Risk   9.6   7.1  3 X Average Risk  23.4   11.0        Use the calculated Patient Ratio above and the CHD Risk Table to determine the patient's CHD Risk.        ATP III CLASSIFICATION (LDL):  <100     mg/dL   Optimal  100-129  mg/dL   Near or Above                    Optimal  130-159  mg/dL   Borderline  160-189  mg/dL   High  >190     mg/dL   Very High   CBC     Status: Abnormal   Collection Time: 01/27/16 10:59 AM  Result Value Ref Range   WBC 20.3 (H) 4.0 - 10.5 K/uL   RBC 4.17 (L) 4.22 - 5.81 MIL/uL   Hemoglobin 13.3 13.0 - 17.0 g/dL   HCT 39.2 39.0 - 52.0 %   MCV 94.0 78.0 - 100.0 fL   MCH 31.9 26.0 - 34.0 pg   MCHC 33.9 30.0 - 36.0 g/dL   RDW 13.5 11.5 - 15.5 %   Platelets 208 150 - 400 K/uL  Protime-INR     Status: None   Collection Time: 01/27/16 10:59 AM  Result Value Ref Range   Prothrombin Time 14.4 11.4 - 15.2 seconds   INR 1.11   APTT     Status: None   Collection Time: 01/27/16 10:59 AM  Result Value Ref Range   aPTT 30 24 - 36 seconds  Troponin I     Status: None   Collection Time: 01/27/16 10:59 AM  Result Value Ref Range   Troponin I <0.03 <0.03 ng/mL   No results found.  Review of Systems  Constitutional: Negative for fever and weight loss.  HENT: Negative for hearing loss.   Respiratory: Negative for cough, hemoptysis and sputum production.   Cardiovascular: Positive for chest pain. Negative for claudication and leg swelling.  Gastrointestinal: Negative for blood in stool, heartburn, melena and nausea.  Genitourinary: Negative for  dysuria.  Musculoskeletal: Negative for myalgias.  Neurological: Negative for dizziness, tremors and focal weakness.  Endo/Heme/Allergies: Does not bruise/bleed easily.    Blood pressure 135/77, pulse (!) 0, resp. rate (!) 0, SpO2 (!) 0 %. Physical Exam  Constitutional: He is oriented to person, place, and time. He appears well-developed.  HENT:  Mouth/Throat: Oropharynx is clear and moist.  Eyes: Conjunctivae are normal.  Neck: No JVD present. No thyromegaly present.  Cardiovascular: Normal rate, regular rhythm, normal heart sounds and intact distal pulses.   Respiratory: Effort normal.  GI: Soft.  Musculoskeletal: Normal range of motion.  Neurological: He is alert and oriented to person, place, and time.  Skin: Skin is warm.  Psychiatric: He has a normal mood and affect.     Assessment/Plan 1. Acute inferior and lateral myocardial infarction with complete heart block and underlying ventricular escape at the rate of 30 bpm. 2. Hyperlipidemia 3. History of bradycardia and left bundle branch block, asymptomatic, diagnosed with left bundle branch block in 2012 4. History of CLL, stable.  Recommendation: Patient emergently taken to the cardiac catheterization lab for possible angioplasty. Please see catheterization report.  [name], MD 01/27/2016, 12:42 PM

## 2016-01-27 NOTE — ED Provider Notes (Signed)
[facility] DEPT Provider Note   CSN: [id number] Arrival date & time: 01/27/16  1020     History   Chief Complaint No chief complaint on file.   HPI Christopher Daniels is a 81 y.o. male.  Patient brought in by [facility] acute onset of anterior chest pain at 7:30 in the morning. Felt fine yesterday. Felt fine when first got up this morning. EKG from the field suggestive of left bundle branch block as well as inferior MI. Also sinus bradycardia. Patient has no known cardiac history did have a cardiac cath in June 2016 without significant findings as per patient. This was done at [facility]. Patient was treated with the nitroglycerin paste by EMS. Dropped his blood pressures and to take that off. Patient also had some nausea. No hypoxia. STEMI was called in the field.      Past Medical History:  Diagnosis Date  . Arthritis    joints  . Cancer ([place])    skin/CLL. on head years ago  . Diverticulitis   . Dysrhythmia    bradycardia  . GERD (gastroesophageal reflux disease)   . H/O Bell's palsy   . HOH (hard of hearing)   . Kidney calculi    stones from years ago  . LBBB (left bundle branch block)     Patient Active Problem List   Diagnosis Date Noted  . CLL (chronic lymphocytic leukemia) ([place]) 06/15/2015    Past Surgical History:  Procedure Laterality Date  . BACK SURGERY  1983   no metal  . CARDIAC CATHETERIZATION N/A 07/29/2014   Procedure: Left Heart Cath;  Surgeon: Teodoro Spray, MD;  Location: [place] [facility];  Service: Cardiovascular;  Laterality: N/A;  . CATARACT EXTRACTION W/PHACO Right 07/18/2014   Procedure: CATARACT EXTRACTION PHACO AND INTRAOCULAR LENS PLACEMENT (IOC);  Surgeon: Estill Cotta, MD;  Location: [facility] ORS;  Service: Ophthalmology;  Laterality: Right;  [place] 03:11 AP% 28.4 CDE 75.64  . CATARACT EXTRACTION W/PHACO Left 06/19/2015   Procedure: CATARACT EXTRACTION PHACO AND INTRAOCULAR LENS PLACEMENT (IOC);  Surgeon: Estill Cotta, MD;   Location: [facility] ORS;  Service: Ophthalmology;  Laterality: Left;  [place] 02:36.5 AP% 27.7 CDE 69.51 fluid pack lot # [id number]  . HERNIA REPAIR Left 2010   inguinal hernia       Home Medications    Prior to Admission medications   Medication Sig Start Date End Date Taking? Authorizing Provider  aspirin 81 MG tablet Take 81 mg by mouth daily.    Historical Provider, MD  atorvastatin (LIPITOR) 10 MG tablet Take 10 mg by mouth daily.    Historical Provider, MD  ibuprofen (ADVIL,MOTRIN) 200 MG tablet Take 200 mg by mouth every 6 (six) hours as needed.    Historical Provider, MD  omeprazole (PRILOSEC) 20 MG capsule Take 20 mg by mouth daily.    Historical Provider, MD    Family History No family history on file.  Social History Social History  Substance Use Topics  . Smoking status: Never Smoker  . Smokeless tobacco: Not on file  . Alcohol use No     Allergies   Niaspan [niacin er]   Review of Systems Review of Systems  Constitutional: Negative for fever.  HENT: Negative for congestion.   Eyes: Negative for visual disturbance.  Respiratory: Negative for shortness of breath.   Cardiovascular: Positive for chest pain.  Gastrointestinal: Positive for nausea.  Genitourinary: Negative for dysuria.  Musculoskeletal: Negative for back pain.  Skin: Negative for rash.  Neurological:  Negative for headaches.  Hematological: Does not bruise/bleed easily.  Psychiatric/Behavioral: Negative for confusion.     Physical Exam Updated Vital Signs There were no vitals taken for this visit.  Physical Exam  Constitutional: He is oriented to person, place, and time. He appears well-developed and well-nourished.  HENT:  Head: Normocephalic and atraumatic.  Mouth/Throat: Oropharynx is clear and moist.  Eyes: Conjunctivae and EOM are normal. Pupils are equal, round, and reactive to light.  Neck: Normal range of motion. Neck supple.  Cardiovascular: Normal heart sounds.   Bradycardic    Pulmonary/Chest: Effort normal and breath sounds normal. No respiratory distress.  Abdominal: Soft. Bowel sounds are normal. There is no tenderness.  Musculoskeletal: Normal range of motion.  Neurological: He is alert and oriented to person, place, and time. No cranial nerve deficit or sensory deficit. He exhibits normal muscle tone. Coordination normal.  Skin: Skin is warm.  Nursing note and vitals reviewed.    ED Treatments / Results  Labs (all labs ordered are listed, but only abnormal results are displayed) Labs Reviewed - No data to display  EKG  EKG Interpretation None       Radiology No results found.  Procedures Procedures (including critical care time)  CRITICAL CARE Performed by: Milus Fritze Total critical care time: 30 minutes Critical care time was exclusive of separately billable procedures and treating other patients. Critical care was necessary to treat or prevent imminent or life-threatening deterioration. Critical care was time spent personally by me on the following activities: development of treatment plan with patient and/or surrogate as well as nursing, discussions with consultants, evaluation of patient's response to treatment, examination of patient, obtaining history from patient or surrogate, ordering and performing treatments and interventions, ordering and review of laboratory studies, ordering and review of radiographic studies, pulse oximetry and re-evaluation of patient's condition.   Medications Ordered in ED Medications - No data to display   Initial Impression / Assessment and Plan / ED Course  I have reviewed the triage vital signs and the nursing notes.  Pertinent labs & imaging results that were available during my care of the patient were reviewed by me and considered in my medical decision making (see chart for details).  Clinical Course    Patient brought in by [facility]. Patient with onset of chest pain anteriorly at 7:30  this morning. He got up at his cup of coffee was pain-free and then got severe pain. Patient has had a cardiac cath recently in June 2017 without any significant findings no stents each. Patient's cardiologist is at [facility].  Patient shortly after getting into the trauma bay and off the EMS stretcher we were notified that cath team was ready so patient was quickly moved up to cath lab. EKG not done here EKG in the field showed a left bundle branch block with findings consistent with an inferior MI with some lateral changes. Patient also had bradycardia paced on the field EKG not able to determine P waves with certainty. But the EKG reading did marketed as sinus bradycardia. Patient did have pacer pads on we switched him over to our pacer pads. Patient was swept up to cath lab. Patient was given nitroglycerin in the field without significant change in the pain. Did develop some hypotension from that.  Heparin was not started yet.    Final Clinical Impressions(s) / ED Diagnoses   Final diagnoses:  ST elevation myocardial infarction (STEMI), unspecified artery [facility])    New Prescriptions New Prescriptions  No medications on file     Fredia Sorrow, MD 01/27/16 1038

## 2016-01-27 NOTE — ED Notes (Signed)
Pt brought to ED by [facility] as code stemi.Sudden onset on chest pain approx. 0730.  As pt arrives into tra C charge rn states cath lab 1 is ready. Pt arrives with 2 peripheral IVs. Pt undressed applied pro pads and taken to cath lab 1 and gave bedside report. Ems gave pt 325mg  ASA 2 sl nitro and 50 mcg fent IV. Pt arrives with chest pain 9/10.

## 2016-01-27 NOTE — Progress Notes (Signed)
   01/27/16 1020  Clinical Encounter Type  Visited With Family  Visit Type Other (Comment) ([facility])  Spiritual Encounters  Spiritual Needs Emotional  Stress Factors  Patient Stress Factors Not reviewed  Family Stress Factors Family relationships  Pt came to ED then to Cath Lab. Escorted family to 2nd floor waiting area near Cath Lab.

## 2016-01-28 ENCOUNTER — Encounter (HOSPITAL_COMMUNITY): Payer: Self-pay | Admitting: *Deleted

## 2016-01-28 DIAGNOSIS — M1990 Unspecified osteoarthritis, unspecified site: Secondary | ICD-10-CM | POA: Diagnosis not present

## 2016-01-28 DIAGNOSIS — I2119 ST elevation (STEMI) myocardial infarction involving other coronary artery of inferior wall: Secondary | ICD-10-CM | POA: Diagnosis not present

## 2016-01-28 DIAGNOSIS — I442 Atrioventricular block, complete: Secondary | ICD-10-CM | POA: Diagnosis not present

## 2016-01-28 DIAGNOSIS — I472 Ventricular tachycardia: Secondary | ICD-10-CM | POA: Diagnosis not present

## 2016-01-28 DIAGNOSIS — I255 Ischemic cardiomyopathy: Secondary | ICD-10-CM | POA: Diagnosis not present

## 2016-01-28 DIAGNOSIS — E785 Hyperlipidemia, unspecified: Secondary | ICD-10-CM | POA: Diagnosis not present

## 2016-01-28 DIAGNOSIS — K219 Gastro-esophageal reflux disease without esophagitis: Secondary | ICD-10-CM | POA: Diagnosis not present

## 2016-01-28 DIAGNOSIS — Z856 Personal history of leukemia: Secondary | ICD-10-CM | POA: Diagnosis not present

## 2016-01-28 DIAGNOSIS — I10 Essential (primary) hypertension: Secondary | ICD-10-CM | POA: Diagnosis not present

## 2016-01-28 DIAGNOSIS — R001 Bradycardia, unspecified: Secondary | ICD-10-CM | POA: Diagnosis not present

## 2016-01-28 DIAGNOSIS — R739 Hyperglycemia, unspecified: Secondary | ICD-10-CM | POA: Diagnosis not present

## 2016-01-28 DIAGNOSIS — Z9842 Cataract extraction status, left eye: Secondary | ICD-10-CM | POA: Diagnosis not present

## 2016-01-28 LAB — TROPONIN I
TROPONIN I: 24.14 ng/mL — AB (ref <0.03)
TROPONIN I: 25.14 ng/mL — AB (ref <0.03)
TROPONIN I: 29.55 ng/mL — AB (ref <0.03)

## 2016-01-28 LAB — CBC
HCT: 39.6 % (ref 39.0–52.0)
HEMOGLOBIN: 13.1 g/dL (ref 13.0–17.0)
MCH: 31.3 pg (ref 26.0–34.0)
MCHC: 33.1 g/dL (ref 30.0–36.0)
MCV: 94.5 fL (ref 78.0–100.0)
Platelets: 202 10*3/uL (ref 150–400)
RBC: 4.19 MIL/uL — AB (ref 4.22–5.81)
RDW: 13.6 % (ref 11.5–15.5)
WBC: 21.9 10*3/uL — ABNORMAL HIGH (ref 4.0–10.5)

## 2016-01-28 LAB — BASIC METABOLIC PANEL
ANION GAP: 6 (ref 5–15)
BUN: 8 mg/dL (ref 6–20)
CALCIUM: 8.3 mg/dL — AB (ref 8.9–10.3)
CO2: 27 mmol/L (ref 22–32)
Chloride: 106 mmol/L (ref 101–111)
Creatinine, Ser: 0.92 mg/dL (ref 0.61–1.24)
Glucose, Bld: 115 mg/dL — ABNORMAL HIGH (ref 65–99)
POTASSIUM: 4 mmol/L (ref 3.5–5.1)
Sodium: 139 mmol/L (ref 135–145)

## 2016-01-28 LAB — HEMOGLOBIN A1C
HEMOGLOBIN A1C: 5.7 % — AB (ref 4.8–5.6)
Mean Plasma Glucose: 117 mg/dL

## 2016-01-28 MED ORDER — LISINOPRIL 10 MG PO TABS
ORAL_TABLET | ORAL | Status: AC
  Administered 2016-01-28: 12:00:00
  Filled 2016-01-28: qty 1

## 2016-01-28 MED ORDER — CARVEDILOL 6.25 MG PO TABS
6.2500 mg | ORAL_TABLET | Freq: Two times a day (BID) | ORAL | Status: DC
  Administered 2016-01-28 – 2016-01-29 (×2): 6.25 mg via ORAL
  Filled 2016-01-28 (×3): qty 1

## 2016-01-28 MED ORDER — LISINOPRIL 10 MG PO TABS
10.0000 mg | ORAL_TABLET | Freq: Every day | ORAL | Status: DC
  Administered 2016-01-28 – 2016-01-29 (×2): 10 mg via ORAL
  Filled 2016-01-28: qty 1

## 2016-01-28 NOTE — Progress Notes (Signed)
CRITICAL VALUE ALERT  Critical value received:  Troponin: 24.14  Date of notification:  01/28/16  Time of notification:  1218  Critical value read back: Yes  Nurse who received alert:  Donnelly Angelica  MD notified (1st page):  Dr. Nadyne Coombes  Time of first page:  1220  Responding MD: Dr. Nadyne Coombes  Time MD responded: 1222   Dr. Nadyne Coombes notified of troponin level. No orders given at this time. Will continue to monitor patient closely.  Donnelly Angelica RN

## 2016-01-28 NOTE — Progress Notes (Signed)
Pt had 15 beat fun of VT at 1:53:15, pt alert, asymptomatic, came out on own as I came into room

## 2016-01-28 NOTE — Progress Notes (Signed)
Subjective:  Patient had an episode of asymptomatic NSVT early this morning. Echocardiogram revealing ischemic cardiomyopathy with LVEF of 25-30%. Asymptomatic and no further chest pain. Son present at the bedside.  Objective:  Vital Signs in the last 24 hours: Temp:  [97.7 F (36.5 C)-99.1 F (37.3 C)] 98.8 F (37.1 C) (12/02 2358) Pulse Rate:  [0-172] 56 (12/03 0700) Resp:  [0-26] 16 (12/03 0700) BP: (106-151)/(62-93) 121/74 (12/03 0700) SpO2:  [0 %-100 %] 96 % (12/03 0700) Weight:  [93.1 kg (205 lb 4 oz)] 93.1 kg (205 lb 4 oz) (12/02 1400)  Blood pressure 121/74, pulse (!) 56, temperature 98.3 F (36.8 C), temperature source Oral, resp. rate 16, height 5' 10.87" (1.8 m), weight 93.1 kg (205 lb 4 oz), SpO2 96 %.   Intake/Output from previous day: 12/02 0701 - 12/03 0700 In: 1155.1 [P.O.:360; I.V.:795.1] Out: 2925 [Urine:2925]  Physical Exam: Constitutional: He is oriented to person, place, and time. He appears well-developed.  Mouth/Throat: Oropharynx is clear and moist.  Eyes: Conjunctivae are normal.  Neck: No JVD present. No thyromegaly present.  Cardiovascular: Normal rate, regular rhythm, normal heart sounds and intact distal pulses.   Respiratory: Effort normal.  GI: Soft.  Musculoskeletal: Normal range of motion.  Neurological: He is alert and oriented to person, place, and time.  Skin: Skin is warm.  Psychiatric: He has a normal mood and affect.  Lab Results: BMP  Recent Labs  01/27/16 1059 01/28/16 0213  NA 138 139  K 3.9 4.0  CL 106 106  CO2 24 27  GLUCOSE 128* 115*  BUN 12 8  CREATININE 1.09 0.92  CALCIUM 8.3* 8.3*  GFRNONAA >60 >60  GFRAA >60 >60    CBC  Recent Labs Lab 01/28/16 0213  WBC 21.9*  RBC 4.19*  HGB 13.1  HCT 39.6  PLT 202  MCV 94.5  MCH 31.3  MCHC 33.1  RDW 13.6    Recent Labs  01/27/16 1059  TROPONINI <0.03    Recent Labs  01/27/16 1421  TSH 0.947    Recent Labs  01/27/16 1059  PROT 5.6*  ALBUMIN 3.4*   AST 21  ALT 17  ALKPHOS 53  BILITOT 0.7   Lipid Panel     Component Value Date/Time   CHOL 141 01/27/2016 1421   TRIG 32 01/27/2016 1421   HDL 32 (L) 01/27/2016 1421   CHOLHDL 4.4 01/27/2016 1421   VLDL 6 01/27/2016 1421   LDLCALC 103 (H) 01/27/2016 1421     Cardiac Studies:  EKG 01/28/2016: Sinus rhythm with first-degree AV block, LBBB, ST-T abnormality, cannot exclude inferior and lateral ischemia, suspect primary ST-T abnormality.  Echocardiogram 01/27/2016: - Left ventricle: The cavity size was normal. Wall thickness was normal. Systolic function was severely reduced. The estimated ejection fraction was in the range of 25% to 30%. Severe diffuse hypokinesis with distinct regional wall motion abnormalities. Severe hypokinesis of the inferolateral, inferior, and inferoseptal myocardium. Dyskinesis of the apical myocardium. Doppler parameters are consistent with abnormal left ventricular  relaxation (grade 1 diastolic dysfunction). Acoustic contrast opacification revealed no evidence ofthrombus. - Ventricular septum: Septal motion showed paradox. These changes are consistent with a left bundle branch block. - Left atrium: The atrium was mildly dilated.  Coronary Angiogram 01/27/2016: 1. Superdominant large RCA. Mid RCA thrombotic occlusion 100% to 0% with implantation of a 4.0 x 30 mm resolute DES, TIMI 0 to TIMI 3 flow. The procedure was prolonged due to difficulty in placing temporary pacemaker. 2. Multiple passes with  thrombectomy catheter with large amount of thrombus removed. Cangrelor used for residual thrombus.  3. There is  60-70% stenosis in the mid LAD, may need outpatient stress testing once he recuperates. 4. Markedly depressed LVEF, 25-30%, inferior inferoapical akinesis, mild global hypokinesis. LV not well visualized due to hand contrast injection. 5. A total of 155 no contrast utilized.  Assessment/Plan:  1. Acute inferior and lateral myocardial infarction with  complete heart block and underlying ventricular escape at the rate of 30 bpm. 2. Ischemic cardiomyopathy; LVEF 25-30% 3. Nonsustained Ventricular tachycardia 4. Hyperlipidemia 5. History of bradycardia and left bundle branch block, asymptomatic, diagnosed with left bundle branch block in 2012 6. History of CLL, stable.  Recommendation: His echocardiogram was reviewed, he will need repeat coronary angiography due to markedly depressed LVEF and global hypokinesis, the mid LAD lesion is probably much more significant than is evident. This will be performed in the next 2-3 weeks. He can be transferred to telemetry, will have cardiac rehabilitation see him, he'll be discharged home on LifeVest in the morning if he remains stable. I will add ACE inhibitor for ischemic cardiomyopathy. No clinical evidence of congestive heart failure. I will discontinue metoprolol and switch him to carvedilol which has less of a negative chronotropic effect and also would help with ischemic cardiomyopathy.  [name], M.D. 01/28/2016, 7:34 AM [facility] Pager: [phone] Office: [phone] If no answer: [phone]

## 2016-01-29 ENCOUNTER — Encounter (HOSPITAL_COMMUNITY): Payer: Self-pay | Admitting: Cardiology

## 2016-01-29 LAB — POCT I-STAT, CHEM 8
BUN: 13 mg/dL (ref 6–20)
CHLORIDE: 104 mmol/L (ref 101–111)
Calcium, Ion: 1.12 mmol/L — ABNORMAL LOW (ref 1.15–1.40)
Creatinine, Ser: 1 mg/dL (ref 0.61–1.24)
GLUCOSE: 123 mg/dL — AB (ref 65–99)
HCT: 38 % — ABNORMAL LOW (ref 39.0–52.0)
Hemoglobin: 12.9 g/dL — ABNORMAL LOW (ref 13.0–17.0)
POTASSIUM: 3.7 mmol/L (ref 3.5–5.1)
Sodium: 141 mmol/L (ref 135–145)
TCO2: 24 mmol/L (ref 0–100)

## 2016-01-29 LAB — POCT ACTIVATED CLOTTING TIME
ACTIVATED CLOTTING TIME: 219 s
Activated Clotting Time: 158 seconds

## 2016-01-29 LAB — HEMOGLOBIN A1C
Hgb A1c MFr Bld: 5.5 % (ref 4.8–5.6)
Mean Plasma Glucose: 111 mg/dL

## 2016-01-29 MED ORDER — ATORVASTATIN CALCIUM 80 MG PO TABS: 80.0000 mg | ORAL_TABLET | Freq: Every evening | ORAL | 3 refills | Status: DC

## 2016-01-29 MED ORDER — NITROGLYCERIN 0.4 MG SL SUBL: 0.4000 mg | SUBLINGUAL_TABLET | SUBLINGUAL | 3 refills | Status: DC | PRN

## 2016-01-29 MED ORDER — CARVEDILOL 6.25 MG PO TABS: 6.2500 mg | ORAL_TABLET | Freq: Two times a day (BID) | ORAL | 2 refills | Status: DC

## 2016-01-29 MED ORDER — LISINOPRIL 10 MG PO TABS: 10.0000 mg | ORAL_TABLET | Freq: Every day | ORAL | 2 refills | Status: DC

## 2016-01-29 MED ORDER — TICAGRELOR 90 MG PO TABS: 90.0000 mg | ORAL_TABLET | Freq: Two times a day (BID) | ORAL | 0 refills | Status: DC

## 2016-01-29 NOTE — Consult Note (Addendum)
   [facility] CM Inpatient Consult   01/29/2016  Christopher Daniels [birth year] [id number]    Referral received from [place] for [place] Management services. Spoke with [name] and daughter at bedside to explain and offer [place] Management program. He lives with wife and plans to return home with a Armed forces training and education officer and home health services. He states he will appreciate telephonic follow up. Written consent obtained. Please see [facility] Primary Care Navigator notes for further details. Confirmed best contact number as [phone].  Explained to [name] that he will receive post hospital discharge calls. Explained that services will not interfere or replace services provided by home health. Expresses appreciation of additional follow up. [facility] Care Management packet, contact information, and 24-hr nurse line magnet provided.   Will refer for [facility] Telephonic RNCM follow up for moderate risk due to cardiac history. Made inpatient RNCM aware.   Marthenia Rolling, MSN-Ed, RN,BSN [facility] Liaison [phone]

## 2016-01-29 NOTE — Progress Notes (Signed)
Insurance check completed for [employer] [place] @ [place] # [id number] OPT- 2    BRILINTA  90 MG BID 30/60 TAB   COVER- YES  CO-PAY- $ 45.00  TIER- 3 DRUG  PRIOR APPROVAL - NO  PHARMACY : [facility]   RETAIL:90 DAY SUPPLY $ 90.00  MAIL- ORDER FOR 90 DAY SUPPLY $ 90.00

## 2016-01-29 NOTE — Progress Notes (Signed)
CARDIAC REHAB PHASE I   PRE:  Rate/Rhythm: 64 SR  BP:  Supine:   Sitting: 119/64  Standing:    SaO2: 99%RA  MODE:  Ambulation: 800 ft   POST:  Rate/Rhythm: 88 SR  BP:  Supine:   Sitting: 120/59  Standing:    SaO2: 98%RA [id number] Pt walked 800 ft with steady gait. Denied CP. Tolerated well. MI education completed with pt who voiced understanding. Does not have brilinta card so he needs to see case manager. Stressed importance of brilinta with stent. Reviewed NTG use, risk factors, MI restrictions, low sodium diet of 2000 mg and heart healthy diet. Gave pt CHF booklet since EF low and reviewed zones and importance of weighing daily, watching sodium and 2L FR. Pt stated he will buy scales which he stated would not be a problem. Tried to put on life vest video for pt to view but system down. Discussed CRP 2 and will refer to [facility] but added comment that pt not to be contacted until after he comes back for relook cath. Pt is aware of this.   Graylon Good, RN BSN  01/29/2016 9:24 AM

## 2016-01-29 NOTE — Discharge Summary (Signed)
Physician Discharge Summary  Patient ID: Christopher Daniels MRN: [id number] DOB/AGE: March 13, 1934 81 y.o.  Admit date: 01/27/2016 Discharge date: 01/29/2016  Primary Discharge Diagnosis 1. Acute inferior and inferolateral myocardial infarction S/P PTCA and stenting with a 4.0 x 30 mm resolute DES to the mid RCA which was super dominant, 100% stenosis reduced to 0%.  Secondary Discharge Diagnosis Hypertension Hyperlipidemia Ischemic cardiomyopathy Hyperglycemia with A1c 5.7%  Significant Diagnostic Studies:  01/27/2016 1. Superdominant large RCA. Mid RCA thrombotic occlusion 100% to 0% with implantation of a 4.0 x 30 mm resolute DES, TIMI 0 to TIMI 3 flow. The procedure was prolonged due to difficulty in placing temporary pacemaker. 2. Multiple passes with thrombectomy catheter with large amount of thrombus removed. Cangrelor used for residual thrombus.  3. There is  70% to 80% stenosis in the mid LAD, may need outpatient stress testing once he recuperates. 4. Markedly depressed LVEF, 25-30%, inferior inferoapical akinesis, mild global hypokinesis. LV not well visualized due to hand contrast injection.  Echocardiogram 01/27/2016: Normal LV size, severe LV systolic dysfunction, EF [id number], diffuse hypokinesis, inferolateral inferior and inferoseptal severe hypokinesis, apical dyskinesis.  Hospital Course: Patient admitted to the hospital with STEMI, emergently taken to the cardiac catheterization lab was found to be in complete heart block with inferolateral ST elevation with underlying ventricular escape. Underwent complex but successful thrombectomy followed by stenting of the mid RCA, he was watched in the ICU for greater than 24 hours he had underlying bradycardia, underlying LBBB, PVCs.  Patient ambulated with cardiac rehabilitation without any complication. Although patient has significant bradycardia, patient has known sinus bradycardia all his life. He tolerated moderate dose of beta blockers  without any side effects or hypotension.   Recommendations on discharge: Patient will be brought back to the current cath is lab probably in 2-3 weeks for evaluation of LAD lesion and possible antroplasty. Although the lesion was felt to be 70% during angiography, given his diffuse hypokinesis by echocardiogram and also LV gram, in some views the lesion did appear much more significant probably 80% hence best option is to proceed with staged intervention to the LAD if felt to be significant after FFR or IVUS. Due to frequent PVCs, severe LV systolic dysfunction with regional wall motion of the Mattie and residual stenosis in LAD, it was felt that patient should have a LifeVest prior to discharge and reevaluate his LV systolic function at a later date.  Discharge Exam: Blood pressure (!) 101/56, pulse 66, temperature 98.8 F (37.1 C), temperature source Oral, resp. rate 20, height 5' 10.87" (1.8 m), weight 93.1 kg (205 lb 4 oz), SpO2 95 %.   General appearance: alert, cooperative, appears stated age and no distress Resp: clear to auscultation bilaterally Cardio: regular rate and rhythm, S1, S2 normal, no murmur, click, rub or gallop GI: soft, non-tender; bowel sounds normal; no masses,  no organomegaly Extremities: extremities normal, atraumatic, no cyanosis or edema Pulses: 2+ and symmetric Neurologic: Grossly normal  Labs:   Lab Results  Component Value Date   WBC 21.9 (H) 01/28/2016   HGB 13.1 01/28/2016   HCT 39.6 01/28/2016   MCV 94.5 01/28/2016   PLT 202 01/28/2016    Recent Labs Lab 01/27/16 1059 01/28/16 0213  NA 138 139  K 3.9 4.0  CL 106 106  CO2 24 27  BUN 12 8  CREATININE 1.09 0.92  CALCIUM 8.3* 8.3*  PROT 5.6*  --   BILITOT 0.7  --   ALKPHOS 53  --  ALT 17  --   AST 21  --   GLUCOSE 128* 115*    Lipid Panel     Component Value Date/Time   CHOL 141 01/27/2016 1421   TRIG 32 01/27/2016 1421   HDL 32 (L) 01/27/2016 1421   CHOLHDL 4.4 01/27/2016 1421    VLDL 6 01/27/2016 1421   LDLCALC 103 (H) 01/27/2016 1421   HEMOGLOBIN A1C Lab Results  Component Value Date   HGBA1C 5.7 (H) 01/27/2016   MPG 117 01/27/2016    Cardiac Panel (last 3 results)  Recent Labs  01/28/16 1115 01/28/16 1542 01/28/16 2219  TROPONINI 24.14* 25.14* 29.55*    Recent Labs  01/27/16 1421  TSH 0.947    EKG 01/28/2016: Sinus rhythm with first-degree AV block, LBBB, ST-T abnormality, cannot exclude inferior and lateral ischemia, suspect primary ST-T abnormality.  FOLLOW UP PLANS AND APPOINTMENTS    Medication List    STOP taking these medications   ibuprofen 200 MG tablet Commonly known as:  ADVIL,MOTRIN     TAKE these medications   aspirin 81 MG tablet Take 81 mg by mouth every evening.   atorvastatin 80 MG tablet Commonly known as:  LIPITOR Take 1 tablet (80 mg total) by mouth every evening. What changed:  medication strength  how much to take   carvedilol 6.25 MG tablet Commonly known as:  COREG Take 1 tablet (6.25 mg total) by mouth 2 (two) times daily with a meal.   lisinopril 10 MG tablet Commonly known as:  PRINIVIL,ZESTRIL Take 1 tablet (10 mg total) by mouth daily.   nitroGLYCERIN 0.4 MG SL tablet Commonly known as:  NITROSTAT Place 1 tablet (0.4 mg total) under the tongue every 5 (five) minutes x 3 doses as needed for chest pain.   omeprazole 20 MG capsule Commonly known as:  PRILOSEC Take 20 mg by mouth every evening.   REFRESH TEARS 0.5 % Soln Generic drug:  carboxymethylcellulose Place 1 drop into both eyes as needed (for dry eyes).   ticagrelor 90 MG Tabs tablet Commonly known as:  BRILINTA Take 1 tablet (90 mg total) by mouth 2 (two) times daily.            Durable Medical Equipment        Start     Ordered   01/29/16 1113  For home use only DME Vest life vest  Once     01/28/16 1113     Follow-up Information    Rachel Bo, NP Follow up on 02/08/2016.   Specialty:  Nurse  Practitioner Why:  09:30 Appointment. Come 15 minutes prior and bring all medications Contact information: [place] [address] [phone]            [name], MD 01/29/2016, 9:07 AM  Pager: [phone] Office: [phone] If no answer: [phone]

## 2016-01-29 NOTE — Care Management Note (Signed)
Case Management Note Marvetta Gibbons RN, BSN Unit 2W-Case Manager [phone]  Patient Details  Name: CEFERINO BLUMENSTOCK MRN: [id number] Date of Birth: Jan 07, 1935  Subjective/Objective:  Pt admitted with CAD                  Action/Plan: PTA pt lived at home- plan to return home- referral for Wetzel- order form has been filled out and faxed this am- spoke with [employer] rep- who states that Live Vest has been approved by insurance- and pt will be fitted today around 315pm- have spoken to pt at bedside to let them know fitting time this afternoon- pt also has been started on  Brilinta  Per insurance check copay is $45/mo ($90/90 day supply mail order) pt has been given 30 day free card to use on discharge- no further CM needs noted.   Expected Discharge Date:     01/29/16             Expected Discharge Plan:  Home/Self Care  In-House Referral:     Discharge planning Services  CM Consult, Medication Assistance  Post Acute Care Choice:  Durable Medical Equipment Choice offered to:     DME Arranged:  Vest life vest DME Agency:  Other - Comment  HH Arranged:  NA HH Agency:  NA  Status of Service:  Completed, signed off  If discussed at [place] of Stay Meetings, dates discussed:    Additional Comments:  Dawayne Patricia, RN 01/29/2016, 1:45 PM

## 2016-01-29 NOTE — Progress Notes (Signed)
Patient in a stable condition, discharge education done by another RN, patient taken off the unit on a wheelchair by an Therapist, sports

## 2016-01-29 NOTE — Consult Note (Signed)
           [facility] CM Primary Care Navigator  01/29/2016  Christopher Daniels 11/23/1934 [id number]   Patient and daughter Christopher Daniels) seen at the bedside to identify discharge needs.  Patient confirms that primary provider is Christopher Daniels with [facility]. Patient is independent with self care and able to drive prior to admission/ surgery. Transportation (to doctor's appointments) will be provided by daughter Christopher Daniels) if needed.  Patient states using [facility] pharmacy to obtain medications with no problem so far. He manages own medications at home ("not much medicines"). Patient states that wife Christopher Daniels) will be the primary caregiver at home.  Plan for discharge is home per patient. Patient and daughter expressed understanding to call primary care provider's office when he returns home, for a post discharge follow-up appointment within a week or sooner if needed.  Patient letter provided as a reminder. He states that he has a scheduled appointment with PCP on 12/26 but voiced that he will contact the office to reschedule at an earlier time.  Patient stated that he has been newly diagnosed with HF and would like to be assisted with disease management and education. Will notify [facility] hospital liaison for follow-up of needs as appropriate.   For questions, please contact:  Dannielle Huh, BSN, RN- [facility] Primary Care Navigator  Telephone: [phone] [place]

## 2016-01-30 ENCOUNTER — Encounter: Payer: Self-pay | Admitting: *Deleted

## 2016-01-30 ENCOUNTER — Other Ambulatory Visit: Payer: Self-pay | Admitting: *Deleted

## 2016-01-30 NOTE — Patient Outreach (Signed)
[place] [facility]) Care Management  [place]  01/30/2016   JUMARION BARRECA 1934-10-08 [id number]   Referral from hospital liaison for Transition of Care. Patient has [facility].   Subjective:  Telephone call to patient who was advised of reason for call and of [facility] care management services.  HIPPA verification received from patient.  Patient voiced that he was recently discharged from hospital following a heart attack and stent placement . States he is home now with support from his family. States he is wearing a life vest   Advised of telephonic transition of care calls. Patient voices consent to services.  Objective:  Hospital diagnoses: STEMI, Unstable angina, Acute MI, inferior wall, Complete heart block, , acute MI inferior lateral . Hospital admission from 12/2-12/05/2015.  Encounter Medications:  Outpatient Encounter Prescriptions as of 01/30/2016  Medication Sig  . aspirin 81 MG tablet Take 81 mg by mouth every evening.   Marland Kitchen atorvastatin (LIPITOR) 80 MG tablet Take 1 tablet (80 mg total) by mouth every evening.  . carboxymethylcellulose (REFRESH TEARS) 0.5 % SOLN Place 1 drop into both eyes as needed (for dry eyes).  . carvedilol (COREG) 6.25 MG tablet Take 1 tablet (6.25 mg total) by mouth 2 (two) times daily with a meal.  . lisinopril (PRINIVIL,ZESTRIL) 10 MG tablet Take 1 tablet (10 mg total) by mouth daily.  . nitroGLYCERIN (NITROSTAT) 0.4 MG SL tablet Place 1 tablet (0.4 mg total) under the tongue every 5 (five) minutes x 3 doses as needed for chest pain.  Marland Kitchen omeprazole (PRILOSEC) 20 MG capsule Take 20 mg by mouth every evening.   . ticagrelor (BRILINTA) 90 MG TABS tablet Take 1 tablet (90 mg total) by mouth 2 (two) times daily.   No facility-administered encounter medications on file as of 01/30/2016.     Functional Status:  In your present state of health, do you have any difficulty performing the following activities: 01/27/2016  Hearing? Y   Vision? N  Difficulty concentrating or making decisions? N  Walking or climbing stairs? N  Dressing or bathing? N  Doing errands, shopping? N  Some recent data might be hidden    Fall/Depression Screening: PHQ 2/9 Scores 01/30/2016  PHQ - 2 Score 0   Fall Risk  01/30/2016  Falls in the past year? No    Assessment:  Recent hospital discharge 01/29/2016.- Dx: Acute inferior & inferolateral myocardial infarction, S/P PTCA & stenting. Wearing Life Vest defibrillator. Eligible for transition of care program. Consents to transition of care calls. Patient was given [facility] welcome packet prior to hospital discharge.    Plan: Assign to care coordinator for transition of care. Follow up calls to assess needs & develop care plan. Patient agrees to set appointment.  Send involvement letter to MD office.  Sherrin Daisy, RN BSN [place] Management Coordinator [facility] Care Management  [phone]

## 2016-02-02 ENCOUNTER — Other Ambulatory Visit: Payer: Self-pay | Admitting: *Deleted

## 2016-02-02 DIAGNOSIS — I213 ST elevation (STEMI) myocardial infarction of unspecified site: Secondary | ICD-10-CM | POA: Diagnosis not present

## 2016-02-02 NOTE — Patient Outreach (Signed)
[place] [facility]) Care Management  02/02/2016  Christopher Daniels [birth year] [id number]  Telephone call to patient who voices that he is doing okay. States he continues to wear life vest defibrillator as instructed except while showering or bathing. States no shocks have been delivered by vest . Voices that he knows what numbers he needs to call if vest delivers shock. Voices no admissions or emergency room visits since our last call. Voices that he has cardiology appointment 12/15 and that primary care appointment is 12/26. States his primary care provider advised him that he did not need to see him before 12/26 for hospital follow up.  [facility] CM Care Plan Problem One   Flowsheet Row Most Recent Value  Care Plan Problem One  At risk for readmission  due to recent heart attack   Role Documenting the Problem One  Care Management Telephonic Coordinator  Care Plan for Problem One  Active  [facility] Long Term Goal (31-90 days)  Avoid readmission within [place] Term Goal Start Date  01/30/16  Interventions for Problem One Long Term Goal  explain importance of MD f/u, taking medications as instructed, wearing life vest as instructed  [facility] CM Short Term Goal #1 (0-30 days)  Patient state appoiintments for followup after hospital stay w/in 14 dayss  [facility] CM Short Term Goal #1 Start Date  01/30/16  Interventions for Short Term Goal #1  Advise pt to make appointments per directions of discharge instructions  [facility] CM Short Term Goal #2 (0-30 days)  Patient will state reasons for wearing life vest & state that he is wearing life vest except while bathing  [facility] CM Short Term Goal #2 Start Date  01/30/16  Interventions for Short Term Goal #2  advise of reasons to wear life vest-& encourage to wear [it provides  monitoring,immediate shock if needed, peace of,]  [facility] CM Short Term Goal #3 (0-30 days)  Patient will state -taking medications as instructed by MD  [facility] CM Short Term Goal #3 Start Date   01/30/16  Interventions for Short Tern Goal #3  Review list of meds with patient/advise importance of med compliance     .   Plan; Will follow up with patient. Follow care plan as noted. Patient agrees with set appointment.  Sherrin Daisy, RN BSN [place] Management Coordinator [facility] Care Management  [phone]

## 2016-02-08 DIAGNOSIS — I252 Old myocardial infarction: Secondary | ICD-10-CM | POA: Diagnosis not present

## 2016-02-08 DIAGNOSIS — I213 ST elevation (STEMI) myocardial infarction of unspecified site: Secondary | ICD-10-CM | POA: Diagnosis not present

## 2016-02-08 DIAGNOSIS — I255 Ischemic cardiomyopathy: Secondary | ICD-10-CM | POA: Diagnosis not present

## 2016-02-08 DIAGNOSIS — Z9861 Coronary angioplasty status: Secondary | ICD-10-CM | POA: Diagnosis not present

## 2016-02-08 DIAGNOSIS — I2510 Atherosclerotic heart disease of native coronary artery without angina pectoris: Secondary | ICD-10-CM | POA: Diagnosis not present

## 2016-02-09 ENCOUNTER — Other Ambulatory Visit: Payer: Self-pay | Admitting: *Deleted

## 2016-02-09 NOTE — Patient Outreach (Signed)
[place] [facility]) Care Management  02/09/2016  Christopher Daniels 06/28/1934 [id number]  Telephone call to patient -#3 transition of care follow up call. Patient voices that he is taking medications as prescribed by his doctors. States has not had any hospital admissions or emergency room visits since our last telephone contact . States no discharges from [employer] had taken place. Voices that he completed visit with cardiologist today and appointment with primary care is scheduled for 02/20/2016. States he has received call from [facility] regarding cardiac rehabilitation that will start next year after he has repeat cardiac cath.    Care Plan goals have been met. See updated care plan as noted. Short term transition of care program completed.  Patient voices no further health care concerns requiring case management.  Patient agress with closure of case.  Plan: Close case; send to care management assistant. Send MD closure letter.   Sherrin Daisy, RN BSN [place] Management Coordinator [facility] Care Management  [phone]

## 2016-02-12 NOTE — H&P (Signed)
OFFICE VISIT NOTES COPIED TO EPIC FOR DOCUMENTATION  . History of Present Illness Christopher Maine FNP-C; 02-21-2016 10:58 AM) Patient words: NP EVAL for Hospital F/U - Pt is currently wearing a LifeVest.  The patient is a 81 year old male who presents with coronary artery disease. He has history of hyperlipidemia, chronic lymphatic leukemia and history of left bundle branch block. He has had coronary angiography on 07/29/2014 and was told to have mild disease.  He had been doing well until the end of November when he started noticing jaw pain and shoulder pain with exertional activity. On 01/27/2016, he developed chest pain which was very severe and advised his wife to call EMS. This was at least an hour and a half after he started having chest pain. He had an abnormal EKG with STEMI, was emergently taken to the cardiac catheterization lab, was found to be in complete heart block with inferolateral ST elevation with underlying ventricular escape. Patient has history of bradycardia in the past and has been told to have a left bundle branch block. He underwent complex but successful thrombectomy followed by stenting of the mid RCA, he was watched in the ICU for greater than 24 hours, he had underlying bradycardia, underlying LBBB, PVCs.  Although patient has significant bradycardia, patient has known sinus bradycardia all his life. He has tolerated moderate dose of beta blockers without any side effects or hypotension. He presents today for follow up. Echocardiogram during hospitalization revealed LVEF of 25-30%, LifeVest was placed for SCD primary prevention. He denies any new symptoms or concerns today.  Additional reasons for visit:  Transition of Care Visit is described as the following: Location of hospitalization: [facility] Reason for hospitalization: Heart Attack Date of discharge: 01/29/2016 Date of first communication with patient: 01/30/2016 Person contacting patient: April,  CMA Current symptoms: Nausea, Diarrhea Questions regarding discharge instructions: What to take for sx. Sent in Rx for Nausea, instructed him to use OTC Immodium for Diarrhea and to call if sx continue Follow up appt: 02/21/16 with Neldon Labella, NP    Problem List/Past Medical (April Garrison; 21-Feb-2016 9:44 AM) ST elevation myocardial infarction (STEMI), unspecified artery (I21.3) 01/27/2016 Unstable angina pectoris (I20.0)  Acute MI, inferior wall (I21.19)  CAD (coronary artery disease) (I25.10)  GERD (gastroesophageal reflux disease) (K21.9)   Allergies (Tiffany Thorpe; 02/05/2016 9:28 AM) Niaspan *ANTIHYPERLIPIDEMICS*  Anaphylaxis.  Family History (April Louretta Shorten; 02-21-2016 9:32 AM) Mother  Deceased. at age 45 from Brain Aneurysm; Hx of CVA; No known Heart conditions Father  Deceased. at age 37 from CHF; Had an MI at unkown age Siblings  Only Child  Social History (April Garrison; Feb 21, 2016 9:33 AM) Current tobacco use  Former smoker. Only smoked Cigars; Quit in early 1990's Non Drinker/No Alcohol Use  Marital status  Married. Number of Children  3. Living Situation  Lives with spouse.  Past Surgical History (April Louretta Shorten; 2016-02-21 9:34 AM) Back Surgery 1983 Hernia Repair 2010 Cataract Extraction-Bilateral 2017 2016  Medication History (April Garrison; February 21, 2016 9:42 AM) Aspirin (81MG  Tablet DR, 1 Oral daily) Active. Atorvastatin Calcium (80MG  Tablet, 1 Oral daily) Active. Carvedilol (6.25MG  Tablet, 1 Oral two times daily) Active. Lisinopril (10MG  Tablet, 1 Oral daily) Active. Nitroglycerin (0.4MG  Tab Sublingual, 1 Sublingual every 5 minutes as needed for chest pain.) Active. Omeprazole (20MG  Capsule DR, 1 Oral daily) Active. Brilinta (90MG  Tablet, 1 Oral two times daily) Active. Ondansetron (4MG  Tablet Disint, 1 (one) Tablet Oral three times daily, as needed, Taken starting 01/30/2016) Active. Medications  Reconciled (Pt brought  medications)  Diagnostic Studies History (April Garrison; Feb 19, 2016 9:36 AM) Coronary Angiogram 01/27/2016 Echocardiogram 01/27/2016 LV EF:25%-30%:Even with definity contrast,wall motion analysis and EF estimation is very difficult. Consider corroboration with another imaging modality. Colonoscopy 2016 Diverticulitis Treadmill stress test 06/2014 Due to Bradycardia    Review of Systems (Bridgette Ebony Hail AGNP-C; 2016/02/19 10:27 AM) General Not Present- Anorexia, Fatigue and Fever. Respiratory Not Present- Cough. Cardiovascular Not Present- Claudications, Edema, Orthopnea and Paroxysmal Nocturnal Dyspnea. Gastrointestinal Not Present- Black, Tarry Stool, Change in Bowel Habits and Nausea. Neurological Not Present- Focal Neurological Symptoms and Syncope. Endocrine Not Present- Cold Intolerance, Excessive Sweating, Heat Intolerance and Thyroid Problems. Hematology Not Present- Anemia, Easy Bruising, Petechiae and Prolonged Bleeding.  Vitals (Bridgette Allison AGNP-C; 2016-02-19 10:05 AM) 02/19/2016 9:25 AM Weight: 199 lb Height: 72in Body Surface Area: 2.13 m Body Mass Index: 26.99 kg/m  Pulse: 40 (Regular)  P.OX: 97% (Room air) BP: 116/54 (Sitting, Left Arm, Standard)  Physical Exam (Bridgette Allison AGNP-C; 2016-02-19 10:28 AM) General Mental Status-Alert. General Appearance-Cooperative, Appears stated age, Not in acute distress. Build & Nutrition-Moderately built.  Head and Neck Thyroid Gland Characteristics - no palpable nodules, no palpable enlargement.  Chest and Lung Exam Palpation Tender - No chest wall tenderness. Auscultation Breath sounds - Clear.  Cardiovascular Inspection Jugular vein - Right - No Distention. Auscultation Rhythm - Bradycardic. Heart Sounds - S1 WNL, S2 WNL and No gallop present. Murmurs & Other Heart Sounds - Murmur - No murmur.  Abdomen Palpation/Percussion Palpation and Percussion of the abdomen reveal - Non  Tender and No hepatosplenomegaly. Auscultation Auscultation of the abdomen reveals - Bowel sounds normal.  Peripheral Vascular Lower Extremity Inspection - Bilateral - Inspection Normal. Palpation - Edema - Bilateral - No edema. Femoral pulse - Left - Normal. Right - Normal. Note: Eccymosis and small nodule at access site, no tenderness, no bruit. Popliteal pulse - Bilateral - Normal. Dorsalis pedis pulse - Bilateral - Normal. Posterior tibial pulse - Bilateral - Normal. Carotid arteries - Bilateral-No Carotid bruit. Abdomen-No prominent abdominal aortic pulsation, No epigastric bruit.  Neurologic Neurologic evaluation reveals -alert and oriented x 3 with no impairment of recent or remote memory. Motor-Grossly intact without any focal deficits.  Musculoskeletal Global Assessment Left Lower Extremity - normal range of motion without pain. Right Lower Extremity - normal range of motion without pain.    Assessment & Plan Christopher Maine FNP-C; 2016-02-19 12:13 PM) Atherosclerosis of native coronary artery of native heart without angina pectoris (I25.10) Story: Coronary Angiogram 01/27/2016: 1. Superdominant large RCA. Mid RCA thrombotic occlusion 100% to 0% with implantation of a 4.0 x 30 mm resolute DES, TIMI 0 to TIMI 3 flow. The procedure was prolonged due to difficulty in placing temporary pacemaker. 2. Multiple passes with thrombectomy catheter with large amount of thrombus removed. Cangrelor used for residual thrombus. 3. There is 70% to 80% stenosis in the mid LAD. 4. Markedly depressed LVEF, 25-30%, inferior inferoapical akinesis, mild global hypokinesis. LV not well visualized due to hand contrast injection. Impression: EKG Feb 19, 2016: Marked sinus bradycardia at a rate of 40 bpm, normal axis, left bundle branch block, inferolateral T-wave inversion. Current Plans Complete electrocardiogram (93000) S/P PTCA (percutaneous transluminal coronary angioplasty)  (Z98.61) History of MI (myocardial infarction) (I25.2) Current Plans METABOLIC PANEL, BASIC (99991111) CBC & PLATELETS (AUTO) [id number]) PT (PROTHROMBIN TIME) (16109) Ischemic cardiomyopathy (I25.5) Story: In office LifeVest check: 99% use, Average daily HR 57, no arrythmias requiring defibrillation Current Plans Interrogation of wearable cardioverter-defibrillator [phone]) Essential  hypertension (I10) Bradycardia (R00.1) LBBB (left bundle branch block) (I44.7)  Current Plans Mechanism of underlying disease process and action of medications discussed with the patient. I also discussed primary/secondary prevention and dietary counseling was done. He is here for a follow up to check access site. Patient denies any problems from right femoral access site. Mild bruising present to site. He reports tolerating his medications and is without any new symptoms. EKG still shows bradycardia, but is unchanged from previous EKG at discharge. He denies any symptoms from bradycardia at this time and BP is well controlled. Patient is wearing LifeVest. Report was checked in the office and was without any events. Informed him that the LifeVest is to be worn for 3 months until he undergos his repeat echocardiogram to evaluate if his heart function has improved. Discussed that if there is no improvement in his heart function, permanent defibrillator placement will need to be discussed at that time. Also discussed with the patient and his family of the plan for Dr. Einar Gip to re-cath with possible stent his LAD lesion that was found. Patient was in agreeance with the plan. Will follow up with Dr. Einar Gip after his procedure. All questions answered. Transition of care performed with sharing of clinical summary (Z91.89)  Addendum Note(Bridgette Allison AGNP-C; 02/09/2016 10:26 AM) 02/08/2016: Creatinine 1.1, potassium 4.7, WBC 16.5, CBC otherwise normal, PT/INR normal  Labs stable for angiogram.   Signed electronically by  Christopher Maine, FNP-C (02/08/2016 12:14 PM)

## 2016-02-13 ENCOUNTER — Ambulatory Visit (HOSPITAL_COMMUNITY)
Admission: RE | Admit: 2016-02-13 | Discharge: 2016-02-13 | Disposition: A | Discharge status: Home / Self Care | Payer: PPO | Source: Ambulatory Visit | Attending: Cardiology | Admitting: Cardiology

## 2016-02-13 ENCOUNTER — Encounter (HOSPITAL_COMMUNITY)
Admission: RE | Disposition: A | Discharge status: Home / Self Care | Payer: Self-pay | Source: Ambulatory Visit | Attending: Cardiology

## 2016-02-13 DIAGNOSIS — E785 Hyperlipidemia, unspecified: Secondary | ICD-10-CM | POA: Insufficient documentation

## 2016-02-13 DIAGNOSIS — I25118 Atherosclerotic heart disease of native coronary artery with other forms of angina pectoris: Secondary | ICD-10-CM | POA: Insufficient documentation

## 2016-02-13 DIAGNOSIS — R001 Bradycardia, unspecified: Secondary | ICD-10-CM | POA: Insufficient documentation

## 2016-02-13 DIAGNOSIS — Z955 Presence of coronary angioplasty implant and graft: Secondary | ICD-10-CM | POA: Diagnosis not present

## 2016-02-13 DIAGNOSIS — Z87891 Personal history of nicotine dependence: Secondary | ICD-10-CM | POA: Diagnosis not present

## 2016-02-13 DIAGNOSIS — Z823 Family history of stroke: Secondary | ICD-10-CM | POA: Diagnosis not present

## 2016-02-13 DIAGNOSIS — I252 Old myocardial infarction: Secondary | ICD-10-CM | POA: Insufficient documentation

## 2016-02-13 DIAGNOSIS — Z8249 Family history of ischemic heart disease and other diseases of the circulatory system: Secondary | ICD-10-CM | POA: Diagnosis not present

## 2016-02-13 DIAGNOSIS — I10 Essential (primary) hypertension: Secondary | ICD-10-CM | POA: Insufficient documentation

## 2016-02-13 DIAGNOSIS — I255 Ischemic cardiomyopathy: Secondary | ICD-10-CM | POA: Insufficient documentation

## 2016-02-13 DIAGNOSIS — K219 Gastro-esophageal reflux disease without esophagitis: Secondary | ICD-10-CM | POA: Diagnosis not present

## 2016-02-13 DIAGNOSIS — C9110 Chronic lymphocytic leukemia of B-cell type not having achieved remission: Secondary | ICD-10-CM | POA: Insufficient documentation

## 2016-02-13 DIAGNOSIS — I447 Left bundle-branch block, unspecified: Secondary | ICD-10-CM | POA: Diagnosis not present

## 2016-02-13 DIAGNOSIS — I2510 Atherosclerotic heart disease of native coronary artery without angina pectoris: Secondary | ICD-10-CM | POA: Diagnosis present

## 2016-02-13 HISTORY — PX: CARDIAC CATHETERIZATION: SHX172

## 2016-02-13 LAB — POCT ACTIVATED CLOTTING TIME: Activated Clotting Time: 395 seconds

## 2016-02-13 SURGERY — CORONARY STENT INTERVENTION
Anesthesia: LOCAL

## 2016-02-13 MED ORDER — HYDROMORPHONE HCL 1 MG/ML IJ SOLN
INTRAMUSCULAR | Status: AC
  Filled 2016-02-13: qty 1

## 2016-02-13 MED ORDER — HEPARIN SODIUM (PORCINE) 1000 UNIT/ML IJ SOLN
INTRAMUSCULAR | Status: AC
  Filled 2016-02-13: qty 1

## 2016-02-13 MED ORDER — LIDOCAINE HCL (PF) 1 % IJ SOLN
INTRAMUSCULAR | Status: DC | PRN
  Administered 2016-02-13: 2 mL

## 2016-02-13 MED ORDER — LABETALOL HCL 5 MG/ML IV SOLN: 10.0000 mg | INTRAVENOUS | Status: AC | PRN

## 2016-02-13 MED ORDER — SODIUM CHLORIDE 0.9% FLUSH: 3.0000 mL | INTRAVENOUS | Status: DC | PRN

## 2016-02-13 MED ORDER — NITROGLYCERIN 1 MG/10 ML FOR IR/CATH LAB
INTRA_ARTERIAL | Status: AC
  Filled 2016-02-13: qty 10

## 2016-02-13 MED ORDER — VERAPAMIL HCL 2.5 MG/ML IV SOLN
INTRAVENOUS | Status: AC
  Filled 2016-02-13: qty 2

## 2016-02-13 MED ORDER — ASPIRIN 81 MG PO CHEW: 81.0000 mg | CHEWABLE_TABLET | ORAL | Status: DC

## 2016-02-13 MED ORDER — MIDAZOLAM HCL 2 MG/2ML IJ SOLN
INTRAMUSCULAR | Status: DC | PRN
  Administered 2016-02-13: 2 mg via INTRAVENOUS

## 2016-02-13 MED ORDER — SODIUM CHLORIDE 0.9 % WEIGHT BASED INFUSION: 1.0000 mL/kg/h | INTRAVENOUS | Status: AC

## 2016-02-13 MED ORDER — IOPAMIDOL (ISOVUE-370) INJECTION 76%
INTRAVENOUS | Status: DC | PRN
  Administered 2016-02-13: 135 mL via INTRA_ARTERIAL

## 2016-02-13 MED ORDER — LIDOCAINE HCL (PF) 1 % IJ SOLN
INTRAMUSCULAR | Status: AC
  Filled 2016-02-13: qty 30

## 2016-02-13 MED ORDER — SODIUM CHLORIDE 0.9% FLUSH: 3.0000 mL | Freq: Two times a day (BID) | INTRAVENOUS | Status: DC

## 2016-02-13 MED ORDER — BIVALIRUDIN BOLUS VIA INFUSION - CUPID
INTRAVENOUS | Status: DC | PRN
  Administered 2016-02-13: 66.675 mg via INTRAVENOUS

## 2016-02-13 MED ORDER — VERAPAMIL HCL 2.5 MG/ML IV SOLN
INTRA_ARTERIAL | Status: DC | PRN
  Administered 2016-02-13: 10 mL via INTRA_ARTERIAL

## 2016-02-13 MED ORDER — SODIUM CHLORIDE 0.9 % IV SOLN: 250.0000 mL | INTRAVENOUS | Status: DC | PRN

## 2016-02-13 MED ORDER — SODIUM CHLORIDE 0.9 % IV SOLN
INTRAVENOUS | Status: DC | PRN
  Administered 2016-02-13: 1.75 mg/kg/h via INTRAVENOUS

## 2016-02-13 MED ORDER — HEPARIN (PORCINE) IN NACL 2-0.9 UNIT/ML-% IJ SOLN
INTRAMUSCULAR | Status: AC
  Filled 2016-02-13: qty 1000

## 2016-02-13 MED ORDER — MIDAZOLAM HCL 2 MG/2ML IJ SOLN
INTRAMUSCULAR | Status: AC
  Filled 2016-02-13: qty 2

## 2016-02-13 MED ORDER — ANGIOPLASTY BOOK
Freq: Once | Status: DC
  Filled 2016-02-13 (×2): qty 1

## 2016-02-13 MED ORDER — BIVALIRUDIN 250 MG IV SOLR
INTRAVENOUS | Status: AC
  Filled 2016-02-13: qty 250

## 2016-02-13 MED ORDER — HEPARIN (PORCINE) IN NACL 2-0.9 UNIT/ML-% IJ SOLN
INTRAMUSCULAR | Status: DC | PRN
  Administered 2016-02-13: 1000 mL

## 2016-02-13 MED ORDER — SODIUM CHLORIDE 0.9 % IV SOLN
INTRAVENOUS | Status: DC
  Administered 2016-02-13: 07:00:00 via INTRAVENOUS

## 2016-02-13 MED ORDER — IOPAMIDOL (ISOVUE-370) INJECTION 76%
INTRAVENOUS | Status: AC
  Filled 2016-02-13: qty 125

## 2016-02-13 MED ORDER — HYDRALAZINE HCL 20 MG/ML IJ SOLN: 5.0000 mg | INTRAMUSCULAR | Status: AC | PRN

## 2016-02-13 MED ORDER — HYDROMORPHONE HCL 1 MG/ML IJ SOLN
INTRAMUSCULAR | Status: DC | PRN
  Administered 2016-02-13: 0.5 mg via INTRAVENOUS

## 2016-02-13 SURGICAL SUPPLY — 18 items
BALLN EUPHORA RX 3.0X20 (BALLOONS) ×2
BALLOON EUPHORA RX 3.0X20 (BALLOONS) ×1 IMPLANT
CATH EXPO 5FR ANG PIGTAIL 145 (CATHETERS) ×2 IMPLANT
CATH OPTITORQUE TIG 4.0 5F (CATHETERS) ×2 IMPLANT
CATH VISTA GUIDE 6FR XB4 (CATHETERS) ×2 IMPLANT
DEVICE RAD COMP TR BAND LRG (VASCULAR PRODUCTS) ×2 IMPLANT
ELECT DEFIB PAD ADLT CADENCE (PAD) ×2 IMPLANT
GLIDESHEATH SLEND A-KIT 6F 20G (SHEATH) ×2 IMPLANT
GUIDEWIRE INQWIRE 1.5J.035X260 (WIRE) ×1 IMPLANT
INQWIRE 1.5J .035X260CM (WIRE) ×2
KIT ENCORE 26 ADVANTAGE (KITS) ×4 IMPLANT
KIT HEART LEFT (KITS) ×2 IMPLANT
PACK CARDIAC CATHETERIZATION (CUSTOM PROCEDURE TRAY) ×2 IMPLANT
STENT RESOLUTE ONYX 3.0X22 (Permanent Stent) ×2 IMPLANT
SYR MEDRAD MARK V 150ML (SYRINGE) ×2 IMPLANT
TRANSDUCER W/STOPCOCK (MISCELLANEOUS) ×2 IMPLANT
TUBING CIL FLEX 10 FLL-RA (TUBING) ×2 IMPLANT
WIRE COUGAR XT STRL 190CM (WIRE) ×2 IMPLANT

## 2016-02-13 NOTE — Discharge Instructions (Signed)
Coronary Angiogram With Stent Coronary angiogram with stent placement is a procedure to widen or open a narrow blood vessel of the heart (coronary artery). Arteries may become blocked by cholesterol buildup (plaques) in the lining or wall. When a coronary artery becomes partially blocked, blood flow to that area decreases. This may lead to chest pain or a heart attack (myocardial infarction). A stent is a small piece of metal that looks like mesh or a spring. Stent placement may be done as treatment for a heart attack or right after a coronary angiogram in which a blocked artery is found. Let your health care provider know about:  Any allergies you have.  All medicines you are taking, including vitamins, herbs, eye drops, creams, and over-the-counter medicines.  Any problems you or family members have had with anesthetic medicines.  Any blood disorders you have.  Any surgeries you have had.  Any medical conditions you have.  Whether you are pregnant or may be pregnant. What are the risks? Generally, this is a safe procedure. However, problems may occur, including:  Damage to the heart or its blood vessels.  A return of blockage.  Bleeding, infection, or bruising at the insertion site.  A collection of blood under the skin (hematoma) at the insertion site.  A blood clot in another part of the body.  Kidney injury.  Allergic reaction to the dye or contrast that is used.  Bleeding into the abdomen (retroperitoneal bleeding). What happens before the procedure? Staying hydrated  Follow instructions from your health care provider about hydration, which may include:  Up to 2 hours before the procedure - you may continue to drink clear liquids, such as water, clear fruit juice, black coffee, and plain tea. Eating and drinking restrictions  Follow instructions from your health care provider about eating and drinking, which may include:  8 hours before the procedure - stop eating  heavy meals or foods such as meat, fried foods, or fatty foods.  6 hours before the procedure - stop eating light meals or foods, such as toast or cereal.  2 hours before the procedure - stop drinking clear liquids. Ask your health care provider about:  Changing or stopping your regular medicines. This is especially important if you are taking diabetes medicines or blood thinners.  Taking medicines such as ibuprofen. These medicines can thin your blood. Do not take these medicines before your procedure if your health care provider instructs you not to. Generally, aspirin is recommended before a procedure of passing a small, thin tube (catheter) through a blood vessel and into the heart (cardiac catheterization). What happens during the procedure?  An IV tube will be inserted into one of your veins.  You will be given one or more of the following:  A medicine to help you relax (sedative).  A medicine to numb the area where the catheter will be inserted into an artery (local anesthetic).  To reduce your risk of infection:  Your health care team will wash or sanitize their hands.  Your skin will be washed with soap.  Hair may be removed from the area where the catheter will be inserted.  Using a guide wire, the catheter will be inserted into an artery. The location may be in your groin, in your wrist, or in the fold of your arm (near your elbow).  A type of X-ray (fluoroscopy) will be used to help guide the catheter to the opening of the arteries in the heart.  A dye will  be injected into the catheter, and X-rays will be taken. The dye will help to show where any narrowing or blockages are located in the arteries.  A tiny wire will be guided to the blocked spot, and a balloon will be inflated to make the artery wider.  The stent will be expanded and will crush the plaques into the wall of the vessel. The stent will hold the area open and improve the blood flow. Most stents have a  drug coating to reduce the risk of the stent narrowing over time.  The artery may be made wider using a drill, laser, or other tools to remove plaques.  When the blood flow is better, the catheter will be removed. The lining of the artery will grow over the stent, which stays where it was placed. This procedure may vary among health care providers and hospitals. What happens after the procedure?  If the procedure is done through the leg, you will be kept in bed lying flat for about 6 hours. You will be instructed to not bend and not cross your legs.  The insertion site will be checked frequently.  The pulse in your foot or wrist will be checked frequently.  You may have additional blood tests, X-rays, and a test that records the electrical activity of your heart (electrocardiogram, or ECG). This information is not intended to replace advice given to you by your health care provider. Make sure you discuss any questions you have with your health care provider. Document Released: 08/18/2002 Document Revised: 10/12/2015 Document Reviewed: 09/17/2015 Elsevier Interactive Patient Education  2017 [place] Introduction Refer to this sheet in the next few weeks. These instructions provide you with information about caring for yourself after your procedure. Your health care provider may also give you more specific instructions. Your treatment has been planned according to current medical practices, but problems sometimes occur. Call your health care provider if you have any problems or questions after your procedure. What can I expect after the procedure? After your procedure, it is typical to have the following:  Bruising at the radial site that usually fades within 1-2 weeks.  Blood collecting in the tissue (hematoma) that may be painful to the touch. It should usually decrease in size and tenderness within 1-2 weeks. Follow these instructions at home:  Take medicines only  as directed by your health care provider.  You may shower 24-48 hours after the procedure or as directed by your health care provider. Remove the bandage (dressing) and gently wash the site with plain soap and water. Pat the area dry with a clean towel. Do not rub the site, because this may cause bleeding.  Do not take baths, swim, or use a hot tub until your health care provider approves.  Check your insertion site every day for redness, swelling, or drainage.  Do not apply powder or lotion to the site.  Do not flex or bend the affected arm for 24 hours or as directed by your health care provider.  Do not push or pull heavy objects with the affected arm for 24 hours or as directed by your health care provider.  Do not lift over 10 lb (4.5 kg) for 5 days after your procedure or as directed by your health care provider.  Ask your health care provider when it is okay to:  Return to work or school.  Resume usual physical activities or sports.  Resume sexual activity.  Do not drive home if  you are discharged the same day as the procedure. Have someone else drive you.  You may drive 24 hours after the procedure unless otherwise instructed by your health care provider.  Do not operate machinery or power tools for 24 hours after the procedure.  If your procedure was done as an outpatient procedure, which means that you went home the same day as your procedure, a responsible adult should be with you for the first 24 hours after you arrive home.  Keep all follow-up visits as directed by your health care provider. This is important. Contact a health care provider if:  You have a fever.  You have chills.  You have increased bleeding from the radial site. Hold pressure on the site. Get help right away if:  You have unusual pain at the radial site.  You have redness, warmth, or swelling at the radial site.  You have drainage (other than a small amount of blood on the dressing) from  the radial site.  The radial site is bleeding, and the bleeding does not stop after 30 minutes of holding steady pressure on the site.  Your arm or hand becomes pale, cool, tingly, or numb. This information is not intended to replace advice given to you by your health care provider. Make sure you discuss any questions you have with your health care provider. Document Released: 03/16/2010 Document Revised: 07/20/2015 Document Reviewed: 08/30/2013  2017 Elsevier

## 2016-02-13 NOTE — Interval H&P Note (Signed)
History and Physical Interval Note:  02/13/2016 7:55 AM  Kern Alberta  has presented today for surgery, with the diagnosis of CAD  The various methods of treatment have been discussed with the patient and family. After consideration of risks, benefits and other options for treatment, the patient has consented to  Procedure(s): Coronary Stent Intervention (N/A) and coronary angiogram as a surgical intervention .  The patient's history has been reviewed, patient examined, no change in status, stable for surgery.  I have reviewed the patient's chart and labs.  Questions were answered to the patient's satisfaction.   Cath Lab Visit (complete for each Cath Lab visit)  Clinical Evaluation Leading to the Procedure:   ACS: No.  Non-ACS:    Anginal Classification: CCS III  Anti-ischemic medical therapy: Maximal Therapy (2 or more classes of medications)  Non-Invasive Test Results: No non-invasive testing performed  Prior CABG: No previous CABG   Ischemic Symptoms? CCS III (Marked limitation of ordinary activity) Anti-ischemic Medical Therapy? Maximal Medical Therapy (2 or more classes of medications) Non-invasive Test Results? No non-invasive testing performed Prior CABG? No Previous CABG   Patient Information:   1-2V CAD, no prox LAD  A (7)  Indication: 20; Score: 7   Patient Information:   1-2V-CAD with DS 50-60% With No FFR, No IVUS  I (3)  Indication: 21; Score: 3   Patient Information:   1-2V-CAD with DS 50-60% With FFR  A (7)  Indication: 22; Score: 7   Patient Information:   1-2V-CAD with DS 50-60% With FFR>0.8, IVUS not significant  I (2)  Indication: 23; Score: 2   Patient Information:   3V-CAD without LMCA With Abnormal LV systolic function  A (9)  Indication: 48; Score: 9   Patient Information:   LMCA-CAD  A (9)  Indication: 49; Score: 9   Patient Information:   2V-CAD with prox LAD PCI  A (7)  Indication: 62; Score: 7   Patient  Information:   2V-CAD with prox LAD CABG  A (8)  Indication: 62; Score: 8   Patient Information:   3V-CAD without LMCA With Low CAD burden(i.e., 3 focal stenoses, low SYNTAX score) PCI  A (7)  Indication: 63; Score: 7   Patient Information:   3V-CAD without LMCA With Low CAD burden(i.e., 3 focal stenoses, low SYNTAX score) CABG  A (9)  Indication: 63; Score: 9   Patient Information:   3V-CAD without LMCA E06c - Intermediate-high CAD burden (i.e., multiple diffuse lesions, presence of CTO, or high SYNTAX score) PCI  U (4)  Indication: 64; Score: 4   Patient Information:   3V-CAD without LMCA E06c - Intermediate-high CAD burden (i.e., multiple diffuse lesions, presence of CTO, or high SYNTAX score) CABG  A (9)  Indication: 64; Score: 9   Patient Information:   LMCA-CAD With Isolated LMCA stenosis  PCI  U (6)  Indication: 65; Score: 6   Patient Information:   LMCA-CAD With Isolated LMCA stenosis  CABG  A (9)  Indication: 65; Score: 9   Patient Information:   LMCA-CAD Additional CAD, low CAD burden (i.e., 1- to 2-vessel additional involvement, low SYNTAX score) PCI  U (5)  Indication: 66; Score: 5   Patient Information:   LMCA-CAD Additional CAD, low CAD burden (i.e., 1- to 2-vessel additional involvement, low SYNTAX score) CABG  A (9)  Indication: 66; Score: 9   Patient Information:   LMCA-CAD Additional CAD, intermediate-high CAD burden (i.e., 3-vessel involvement, presence of CTO, or high SYNTAX  score) PCI  I (3)  Indication: 67; Score: 3   Patient Information:   LMCA-CAD Additional CAD, intermediate-high CAD burden (i.e., 3-vessel involvement, presence of CTO, or high SYNTAX score) CABG  A (9)  Indication: 67; Score: 9         Christopher Daniels

## 2016-02-13 NOTE — Progress Notes (Signed)
Received call from telemetry to notify me client has 2.2 second pause and Dr Einar Gip notified and no new orders noted

## 2016-02-13 NOTE — Progress Notes (Signed)
Dr Einar Gip in to see client and ok to d/c home per Dr Einar Gip

## 2016-02-13 NOTE — Progress Notes (Signed)
Patient seen and examined, right wrist is stable, sinus bradycardia is his baseline, heart rate around 37-42 bpm. No heart block. Stable for discharge.

## 2016-02-13 NOTE — Progress Notes (Signed)
[id number] Pt seen by me last admission. No questions re ed done then. Re enforced importance of brilinta with stent. Has been watching sodium and weighing daily. Discussed walking instructions for ex. Will send update to [facility] Phase 2 and referral. Pt stated she had contacted him and would start Phase 2 after New Year. Graylon Good RN BSN 02/13/2016 1:53 PM

## 2016-02-13 NOTE — Progress Notes (Signed)
Received call from telemetry that client's heart rate is consistently low 30's and Dr Einar Gip notified

## 2016-02-14 ENCOUNTER — Encounter (HOSPITAL_COMMUNITY): Payer: Self-pay | Admitting: Cardiology

## 2016-02-14 ENCOUNTER — Encounter: Payer: Self-pay | Admitting: *Deleted

## 2016-02-14 MED FILL — Heparin Sodium (Porcine) Inj 1000 Unit/ML: INTRAMUSCULAR | Qty: 10 | Status: AC

## 2016-02-20 DIAGNOSIS — Z125 Encounter for screening for malignant neoplasm of prostate: Secondary | ICD-10-CM | POA: Diagnosis not present

## 2016-02-20 DIAGNOSIS — Z0000 Encounter for general adult medical examination without abnormal findings: Secondary | ICD-10-CM | POA: Diagnosis not present

## 2016-02-20 DIAGNOSIS — M542 Cervicalgia: Secondary | ICD-10-CM | POA: Diagnosis not present

## 2016-02-20 DIAGNOSIS — I255 Ischemic cardiomyopathy: Secondary | ICD-10-CM | POA: Diagnosis not present

## 2016-02-20 DIAGNOSIS — C9110 Chronic lymphocytic leukemia of B-cell type not having achieved remission: Secondary | ICD-10-CM | POA: Diagnosis not present

## 2016-02-20 DIAGNOSIS — E538 Deficiency of other specified B group vitamins: Secondary | ICD-10-CM | POA: Diagnosis not present

## 2016-02-20 DIAGNOSIS — I252 Old myocardial infarction: Secondary | ICD-10-CM | POA: Insufficient documentation

## 2016-02-22 DIAGNOSIS — Z9861 Coronary angioplasty status: Secondary | ICD-10-CM | POA: Diagnosis not present

## 2016-02-22 DIAGNOSIS — I255 Ischemic cardiomyopathy: Secondary | ICD-10-CM | POA: Diagnosis not present

## 2016-02-22 DIAGNOSIS — I2510 Atherosclerotic heart disease of native coronary artery without angina pectoris: Secondary | ICD-10-CM | POA: Diagnosis not present

## 2016-02-22 DIAGNOSIS — I252 Old myocardial infarction: Secondary | ICD-10-CM | POA: Diagnosis not present

## 2016-03-04 ENCOUNTER — Encounter: Payer: PPO | Attending: Cardiology | Admitting: *Deleted

## 2016-03-04 VITALS — Ht 70.0 in | Wt 188.3 lb

## 2016-03-04 DIAGNOSIS — I213 ST elevation (STEMI) myocardial infarction of unspecified site: Secondary | ICD-10-CM

## 2016-03-04 DIAGNOSIS — Z955 Presence of coronary angioplasty implant and graft: Secondary | ICD-10-CM | POA: Insufficient documentation

## 2016-03-04 NOTE — Progress Notes (Signed)
Cardiac Individual Treatment Plan  Patient Details  Name: Christopher Daniels MRN: [id number] Date of Birth: 16-Jan-1935 Referring Provider:   Flowsheet Row Cardiac Rehab from 03/04/2016 in [facility] Cardiac and Pulmonary Rehab  Referring Provider  [name] MD      Initial Encounter Date:  Flowsheet Row Cardiac Rehab from 03/04/2016 in [facility] Cardiac and Pulmonary Rehab  Date  03/04/16  Referring Provider  [name] MD      Visit Diagnosis: ST elevation myocardial infarction (STEMI), unspecified artery [facility])  Status post coronary artery stent placement  Patient's Home Medications on Admission:  Current Outpatient Prescriptions:  .  aspirin 81 MG tablet, Take 81 mg by mouth every evening. , Disp: , Rfl:  .  atorvastatin (LIPITOR) 80 MG tablet, Take 1 tablet (80 mg total) by mouth every evening., Disp: 30 tablet, Rfl: 3 .  carboxymethylcellulose (REFRESH TEARS) 0.5 % SOLN, Place 1 drop into both eyes as needed (for dry eyes)., Disp: , Rfl:  .  carvedilol (COREG) 6.25 MG tablet, Take 1 tablet (6.25 mg total) by mouth 2 (two) times daily with a meal., Disp: 60 tablet, Rfl: 2 .  co-enzyme Q-10 30 MG capsule, Take 30 mg by mouth daily., Disp: , Rfl:  .  lisinopril (PRINIVIL,ZESTRIL) 10 MG tablet, Take 1 tablet (10 mg total) by mouth daily., Disp: 30 tablet, Rfl: 2 .  nitroGLYCERIN (NITROSTAT) 0.4 MG SL tablet, Place 1 tablet (0.4 mg total) under the tongue every 5 (five) minutes x 3 doses as needed for chest pain., Disp: 25 tablet, Rfl: 3 .  omeprazole (PRILOSEC) 20 MG capsule, Take 20 mg by mouth every evening. , Disp: , Rfl:  .  ticagrelor (BRILINTA) 90 MG TABS tablet, Take 1 tablet (90 mg total) by mouth 2 (two) times daily., Disp: 60 tablet, Rfl: 0  Past Medical History: Past Medical History:  Diagnosis Date  . Arthritis    joints  . Cancer ([place])    skin/CLL. on head years ago  . Diverticulitis   . Dysrhythmia    bradycardia  . GERD (gastroesophageal reflux disease)   . H/O Bell's palsy    . HOH (hard of hearing)   . Kidney calculi    stones from years ago  . LBBB (left bundle branch block)     Tobacco Use: History  Smoking Status  . Never Smoker  Smokeless Tobacco  . Not on file    Labs: Recent Review Flowsheet Data    Labs for ITP Cardiac and Pulmonary Rehab Latest Ref Rng & Units 01/27/2016 01/27/2016 01/27/2016 01/27/2016 01/28/2016   Cholestrol 0 - 200 mg/dL - - 138 141 -   LDLCALC 0 - 99 mg/dL - - 90 103(H) -   HDL >40 mg/dL - - 29(L) 32(L) -   Trlycerides <150 mg/dL - - 93 32 -   Hemoglobin A1c 4.8 - 5.6 % - 5.7(H) - - 5.5   TCO2 0 - 100 mmol/L 24 - - - -       Exercise Target Goals: Date: 03/04/16  Exercise Program Goal: Individual exercise prescription set with THRR, safety & activity barriers. Participant demonstrates ability to understand and report RPE using BORG scale, to self-measure pulse accurately, and to acknowledge the importance of the exercise prescription.  Exercise Prescription Goal: Starting with aerobic activity 30 plus minutes a day, 3 days per week for initial exercise prescription. Provide home exercise prescription and guidelines that participant acknowledges understanding prior to discharge.  Activity Barriers & Risk Stratification:  Activity Barriers & Cardiac Risk Stratification - 03/04/16 1350      Activity Barriers & Cardiac Risk Stratification   Activity Barriers None   Cardiac Risk Stratification High      6 Minute Walk:     6 Minute Walk    Row Name 03/04/16 1508         6 Minute Walk   Phase Initial     Distance 1570 feet     Walk Time 6 minutes     # of Rest Breaks 0     MPH 2.97     METS 3.36     RPE 12     VO2 Peak 11.78     Symptoms No     Resting HR 80 bpm     Resting BP 106/56     Max Ex. HR 112 bpm     Max Ex. BP 166/60     2 Minute Post BP 126/70        Initial Exercise Prescription:     Initial Exercise Prescription - 03/04/16 1500      Date of Initial Exercise RX and Referring  Provider   Date 03/04/16   Referring Provider [name] MD     Treadmill   MPH 2.7   Grade 0.5   Minutes 15   METs 3.25     NuStep   Level 2   Watts --  80-100 spm   Minutes 15   METs 2     Recumbant Elliptical   Level 1   RPM 50   Minutes 15   METs 2     Prescription Details   Frequency (times per week) 3   Duration Progress to 45 minutes of aerobic exercise without signs/symptoms of physical distress     Intensity   THRR 40-80% of Max Heartrate 104-129   Ratings of Perceived Exertion 11-15   Perceived Dyspnea 0-4     Progression   Progression Continue to progress workloads to maintain intensity without signs/symptoms of physical distress.     Resistance Training   Training Prescription Yes   Weight 3 lbs   Reps 10-15      Perform Capillary Blood Glucose checks as needed.  Exercise Prescription Changes:      Exercise Prescription Changes    Row Name 03/04/16 1300             Exercise Review   Progression -  walk test results         Response to Exercise   Blood Pressure (Admit) 106/56       Blood Pressure (Exercise) 166/60       Blood Pressure (Exit) 126/70       Heart Rate (Admit) 80 bpm       Heart Rate (Exercise) 112 bpm       Heart Rate (Exit) 88 bpm       Oxygen Saturation (Admit) 98 %       Oxygen Saturation (Exercise) 91 %       Rating of Perceived Exertion (Exercise) 12       Symptoms none          Exercise Comments:   Discharge Exercise Prescription (Final Exercise Prescription Changes):     Exercise Prescription Changes - 03/04/16 1300      Exercise Review   Progression --  walk test results     Response to Exercise   Blood Pressure (Admit) 106/56   Blood Pressure (Exercise) 166/60  Blood Pressure (Exit) 126/70   Heart Rate (Admit) 80 bpm   Heart Rate (Exercise) 112 bpm   Heart Rate (Exit) 88 bpm   Oxygen Saturation (Admit) 98 %   Oxygen Saturation (Exercise) 91 %   Rating of Perceived Exertion (Exercise) 12    Symptoms none      Nutrition:  Target Goals: Understanding of nutrition guidelines, daily intake of sodium 1500mg , cholesterol 200mg , calories 30% from fat and 7% or less from saturated fats, daily to have 5 or more servings of fruits and vegetables.  Biometrics:     Pre Biometrics - 03/04/16 1512      Pre Biometrics   Height 5\' 10"  (1.778 m)   Weight 188 lb 4.8 oz (85.4 kg)   Waist Circumference 37 inches   Hip Circumference 41 inches   Waist to Hip Ratio 0.9 %   BMI (Calculated) 27.1   Single Leg Stand 3.28 seconds       Nutrition Therapy Plan and Nutrition Goals:     Nutrition Therapy & Goals - 03/04/16 1347      Intervention Plan   Intervention Prescribe, educate and counsel regarding individualized specific dietary modifications aiming towards targeted core components such as weight, hypertension, lipid management, diabetes, heart failure and other comorbidities.   Expected Outcomes Short Term Goal: Understand basic principles of dietary content, such as calories, fat, sodium, cholesterol and nutrients.;Short Term Goal: A plan has been developed with personal nutrition goals set during dietitian appointment.;Long Term Goal: Adherence to prescribed nutrition plan.      Nutrition Discharge: Rate Your Plate Scores:     Nutrition Assessments - 03/04/16 1347      MEDFICTS Scores   Pre Score 39      Nutrition Goals Re-Evaluation:   Psychosocial: Target Goals: Acknowledge presence or absence of depression, maximize coping skills, provide positive support system. Participant is able to verbalize types and ability to use techniques and skills needed for reducing stress and depression.  Initial Review & Psychosocial Screening:     Initial Psych Review & Screening - 03/04/16 1348      Initial Review   Current issues with --  None reported     Family Dynamics   Good Support System? Yes  Wife, church, family     Barriers   Psychosocial barriers to  participate in program There are no identifiable barriers or psychosocial needs.;The patient should benefit from training in stress management and relaxation.     Screening Interventions   Interventions Encouraged to exercise      Quality of Life Scores:     Quality of Life - 03/04/16 1349      Quality of Life Scores   Health/Function Pre 25.29 %   Socioeconomic Pre 27.75 %   Psych/Spiritual Pre 28.29 %   Family Pre 27.6 %   GLOBAL Pre 26.77 %      PHQ-9: Recent Review Flowsheet Data    Depression screen [facility] 2/9 03/04/2016 01/30/2016   Decreased Interest 0 0   Down, Depressed, Hopeless 0 0   PHQ - 2 Score 0 0   Altered sleeping 0 -   Tired, decreased energy 0 -   Change in appetite 0 -   Feeling bad or failure about yourself  0 -   Trouble concentrating 0 -   Moving slowly or fidgety/restless 0 -   Suicidal thoughts 0 -   PHQ-9 Score 0 -   Difficult doing work/chores Not difficult at all -  Psychosocial Evaluation and Intervention:   Psychosocial Re-Evaluation:   Vocational Rehabilitation: Provide vocational rehab assistance to qualifying candidates.   Vocational Rehab Evaluation & Intervention:     Vocational Rehab - 03/04/16 1351      Initial Vocational Rehab Evaluation & Intervention   Assessment shows need for Vocational Rehabilitation No      Education: Education Goals: Education classes will be provided on a weekly basis, covering required topics. Participant will state understanding/return demonstration of topics presented.  Learning Barriers/Preferences:     Learning Barriers/Preferences - 03/04/16 1350      Learning Barriers/Preferences   Learning Barriers None   Learning Preferences None      Education Topics: General Nutrition Guidelines/Fats and Fiber: -Group instruction provided by verbal, written material, models and posters to present the general guidelines for heart healthy nutrition. Gives an explanation and review of dietary  fats and fiber.   Controlling Sodium/Reading Food Labels: -Group verbal and written material supporting the discussion of sodium use in heart healthy nutrition. Review and explanation with models, verbal and written materials for utilization of the food label.   Exercise Physiology & Risk Factors: - Group verbal and written instruction with models to review the exercise physiology of the cardiovascular system and associated critical values. Details cardiovascular disease risk factors and the goals associated with each risk factor.   Aerobic Exercise & Resistance Training: - Gives group verbal and written discussion on the health impact of inactivity. On the components of aerobic and resistive training programs and the benefits of this training and how to safely progress through these programs.   Flexibility, Balance, General Exercise Guidelines: - Provides group verbal and written instruction on the benefits of flexibility and balance training programs. Provides general exercise guidelines with specific guidelines to those with heart or lung disease. Demonstration and skill practice provided.   Stress Management: - Provides group verbal and written instruction about the health risks of elevated stress, cause of high stress, and healthy ways to reduce stress.   Depression: - Provides group verbal and written instruction on the correlation between heart/lung disease and depressed mood, treatment options, and the stigmas associated with seeking treatment.   Anatomy & Physiology of the Heart: - Group verbal and written instruction and models provide basic cardiac anatomy and physiology, with the coronary electrical and arterial systems. Review of: AMI, Angina, Valve disease, Heart Failure, Cardiac Arrhythmia, Pacemakers, and the ICD.   Cardiac Procedures: - Group verbal and written instruction and models to describe the testing methods done to diagnose heart disease. Reviews the outcomes  of the test results. Describes the treatment choices: Medical Management, Angioplasty, or Coronary Bypass Surgery.   Cardiac Medications: - Group verbal and written instruction to review commonly prescribed medications for heart disease. Reviews the medication, class of the drug, and side effects. Includes the steps to properly store meds and maintain the prescription regimen.   Go Sex-Intimacy & Heart Disease, Get SMART - Goal Setting: - Group verbal and written instruction through game format to discuss heart disease and the return to sexual intimacy. Provides group verbal and written material to discuss and apply goal setting through the application of the S.M.A.R.T. Method.   Other Matters of the Heart: - Provides group verbal, written materials and models to describe Heart Failure, Angina, Valve Disease, and Diabetes in the realm of heart disease. Includes description of the disease process and treatment options available to the cardiac patient.   Exercise & Equipment Safety: - Individual verbal instruction  and demonstration of equipment use and safety with use of the equipment. Flowsheet Row Cardiac Rehab from 03/04/2016 in [facility] Cardiac and Pulmonary Rehab  Date  03/04/16  Educator  Sb  Instruction Review Code  2- meets goals/outcomes      Infection Prevention: - Provides verbal and written material to individual with discussion of infection control including proper hand washing and proper equipment cleaning during exercise session. Flowsheet Row Cardiac Rehab from 03/04/2016 in [facility] Cardiac and Pulmonary Rehab  Date  03/04/16  Educator  Sb  Instruction Review Code  2- meets goals/outcomes      Falls Prevention: - Provides verbal and written material to individual with discussion of falls prevention and safety. Flowsheet Row Cardiac Rehab from 03/04/2016 in [facility] Cardiac and Pulmonary Rehab  Date  03/04/16  Educator  SB  Instruction Review Code  2- meets goals/outcomes       Diabetes: - Individual verbal and written instruction to review signs/symptoms of diabetes, desired ranges of glucose level fasting, after meals and with exercise. Advice that pre and post exercise glucose checks will be done for 3 sessions at entry of program.    Knowledge Questionnaire Score:     Knowledge Questionnaire Score - 03/04/16 1351      Knowledge Questionnaire Score   Pre Score 23/28  Reviewed correct responses with [place] [employer] today      Core Components/Risk Factors/Patient Goals at Admission:     Personal Goals and Risk Factors at Admission - 03/04/16 1347      Core Components/Risk Factors/Patient Goals on Admission    Weight Management Yes;Weight Loss   Intervention Weight Management: Develop a combined nutrition and exercise program designed to reach desired caloric intake, while maintaining appropriate intake of nutrient and fiber, sodium and fats, and appropriate energy expenditure required for the weight goal.;Weight Management: Provide education and appropriate resources to help participant work on and attain dietary goals.   Admit Weight 188 lb 4.8 oz (85.4 kg)   Goal Weight: Short Term 185 lb (83.9 kg)   Goal Weight: Long Term 180 lb (81.6 kg)   Expected Outcomes Long Term: Adherence to nutrition and physical activity/exercise program aimed toward attainment of established weight goal;Short Term: Continue to assess and modify interventions until short term weight is achieved;Weight Loss: Understanding of general recommendations for a balanced deficit meal plan, which promotes 1-2 lb weight loss per week and includes a negative energy balance of [phone] kcal/d;Understanding recommendations for meals to include 15-35% energy as protein, 25-35% energy from fat, 35-60% energy from carbohydrates, less than 200mg  of dietary cholesterol, 20-35 gm of total fiber daily;Understanding of distribution of calorie intake throughout the day with the consumption of 4-5 meals/snacks    Sedentary Yes   Intervention Develop an individualized exercise prescription for aerobic and resistive training based on initial evaluation findings, risk stratification, comorbidities and participant's personal goals.;Provide advice, education, support and counseling about physical activity/exercise needs.   Expected Outcomes Achievement of increased cardiorespiratory fitness and enhanced flexibility, muscular endurance and strength shown through measurements of functional capacity and personal statement of participant.   Increase Strength and Stamina Yes   Intervention Provide advice, education, support and counseling about physical activity/exercise needs.;Develop an individualized exercise prescription for aerobic and resistive training based on initial evaluation findings, risk stratification, comorbidities and participant's personal goals.   Expected Outcomes Achievement of increased cardiorespiratory fitness and enhanced flexibility, muscular endurance and strength shown through measurements of functional capacity and personal statement of participant.   Hypertension Yes  Intervention Provide education on lifestyle modifcations including regular physical activity/exercise, weight management, moderate sodium restriction and increased consumption of fresh fruit, vegetables, and low fat dairy, alcohol moderation, and smoking cessation.;Monitor prescription use compliance.   Expected Outcomes Short Term: Continued assessment and intervention until BP is < 140/74mm HG in hypertensive participants. < 130/86mm HG in hypertensive participants with diabetes, heart failure or chronic kidney disease.;Long Term: Maintenance of blood pressure at goal levels.   Lipids Yes   Intervention Provide education and support for participant on nutrition & aerobic/resistive exercise along with prescribed medications to achieve LDL 70mg , HDL >40mg .   Expected Outcomes Short Term: Participant states understanding of  desired cholesterol values and is compliant with medications prescribed. Participant is following exercise prescription and nutrition guidelines.;Long Term: Cholesterol controlled with medications as prescribed, with individualized exercise RX and with personalized nutrition plan. Value goals: LDL < 70mg , HDL > 40 mg.      Core Components/Risk Factors/Patient Goals Review:    Core Components/Risk Factors/Patient Goals at Discharge (Final Review):    ITP Comments:     ITP Comments    Row Name 03/04/16 1338           ITP Comments Med review completed. Initial ITP created.  Documentation of diagnosis can be found in [facility] 01/27/2016 and 02/13/2016 admission          Comments: Initial ITP

## 2016-03-04 NOTE — Patient Instructions (Addendum)
Patient Instructions  Patient Details  Name: Christopher Daniels MRN: [id number] Date of Birth: [birth year] Referring Provider:  [name], MD  Below are the personal goals you chose as well as exercise and nutrition goals. Our goal is to help you keep on track towards obtaining and maintaining your goals. We will be discussing your progress on these goals with you throughout the program.  Initial Exercise Prescription:     Initial Exercise Prescription - 03/04/16 1500      Date of Initial Exercise RX and Referring Provider   Date 03/04/16   Referring Provider [name] MD     Treadmill   MPH 2.7   Grade 0.5   Minutes 15   METs 3.25     NuStep   Level 2   Watts --  80-100 spm   Minutes 15   METs 2     Recumbant Elliptical   Level 1   RPM 50   Minutes 15   METs 2     Prescription Details   Frequency (times per week) 3   Duration Progress to 45 minutes of aerobic exercise without signs/symptoms of physical distress     Intensity   THRR 40-80% of Max Heartrate 104-129   Ratings of Perceived Exertion 11-15   Perceived Dyspnea 0-4     Progression   Progression Continue to progress workloads to maintain intensity without signs/symptoms of physical distress.     Resistance Training   Training Prescription Yes   Weight 3 lbs   Reps 10-15      Exercise Goals: Frequency: Be able to perform aerobic exercise three times per week working toward 3-5 days per week.  Intensity: Work with a perceived exertion of 11 (fairly light) - 15 (hard) as tolerated. Follow your new exercise prescription and watch for changes in prescription as you progress with the program. Changes will be reviewed with you when they are made.  Duration: You should be able to do 30 minutes of continuous aerobic exercise in addition to a 5 minute warm-up and a 5 minute cool-down routine.  Nutrition Goals: Your personal nutrition goals will be established when you do your nutrition analysis with the  dietician.  The following are nutrition guidelines to follow: Cholesterol < 200mg /day Sodium < 1500mg /day Fiber: Men over 50 yrs - 30 grams per day  Personal Goals:     Personal Goals and Risk Factors at Admission - 03/04/16 1347      Core Components/Risk Factors/Patient Goals on Admission    Weight Management Yes;Weight Loss   Intervention Weight Management: Develop a combined nutrition and exercise program designed to reach desired caloric intake, while maintaining appropriate intake of nutrient and fiber, sodium and fats, and appropriate energy expenditure required for the weight goal.;Weight Management: Provide education and appropriate resources to help participant work on and attain dietary goals.   Admit Weight 188 lb 4.8 oz (85.4 kg)   Goal Weight: Short Term 185 lb (83.9 kg)   Goal Weight: Long Term 180 lb (81.6 kg)   Expected Outcomes Long Term: Adherence to nutrition and physical activity/exercise program aimed toward attainment of established weight goal;Short Term: Continue to assess and modify interventions until short term weight is achieved;Weight Loss: Understanding of general recommendations for a balanced deficit meal plan, which promotes 1-2 lb weight loss per week and includes a negative energy balance of [phone] kcal/d;Understanding recommendations for meals to include 15-35% energy as protein, 25-35% energy from fat, 35-60% energy from carbohydrates, less than  200mg  of dietary cholesterol, 20-35 gm of total fiber daily;Understanding of distribution of calorie intake throughout the day with the consumption of 4-5 meals/snacks   Sedentary Yes   Intervention Develop an individualized exercise prescription for aerobic and resistive training based on initial evaluation findings, risk stratification, comorbidities and participant's personal goals.;Provide advice, education, support and counseling about physical activity/exercise needs.   Expected Outcomes Achievement of  increased cardiorespiratory fitness and enhanced flexibility, muscular endurance and strength shown through measurements of functional capacity and personal statement of participant.   Increase Strength and Stamina Yes   Intervention Provide advice, education, support and counseling about physical activity/exercise needs.;Develop an individualized exercise prescription for aerobic and resistive training based on initial evaluation findings, risk stratification, comorbidities and participant's personal goals.   Expected Outcomes Achievement of increased cardiorespiratory fitness and enhanced flexibility, muscular endurance and strength shown through measurements of functional capacity and personal statement of participant.   Hypertension Yes   Intervention Provide education on lifestyle modifcations including regular physical activity/exercise, weight management, moderate sodium restriction and increased consumption of fresh fruit, vegetables, and low fat dairy, alcohol moderation, and smoking cessation.;Monitor prescription use compliance.   Expected Outcomes Short Term: Continued assessment and intervention until BP is < 140/58mm HG in hypertensive participants. < 130/38mm HG in hypertensive participants with diabetes, heart failure or chronic kidney disease.;Long Term: Maintenance of blood pressure at goal levels.   Lipids Yes   Intervention Provide education and support for participant on nutrition & aerobic/resistive exercise along with prescribed medications to achieve LDL 70mg , HDL >40mg .   Expected Outcomes Short Term: Participant states understanding of desired cholesterol values and is compliant with medications prescribed. Participant is following exercise prescription and nutrition guidelines.;Long Term: Cholesterol controlled with medications as prescribed, with individualized exercise RX and with personalized nutrition plan. Value goals: LDL < 70mg , HDL > 40 mg.      Tobacco Use Initial  Evaluation: History  Smoking Status  . Never Smoker  Smokeless Tobacco  . Not on file    Copy of goals given to participant.

## 2016-03-08 ENCOUNTER — Encounter: Payer: PPO | Admitting: *Deleted

## 2016-03-08 DIAGNOSIS — Z955 Presence of coronary angioplasty implant and graft: Secondary | ICD-10-CM

## 2016-03-08 DIAGNOSIS — I213 ST elevation (STEMI) myocardial infarction of unspecified site: Secondary | ICD-10-CM

## 2016-03-08 NOTE — Progress Notes (Addendum)
Daily Session Note  Patient Details  Name: Christopher Daniels MRN: [id number] Date of Birth: June 06, 1934 Referring Provider:   Flowsheet Row Cardiac Rehab from 03/04/2016 in [facility] Cardiac and Pulmonary Rehab  Referring Provider  [name] MD      Encounter Date: 03/08/2016  Check In:     Session Check In - 03/08/16 0858      Check-In   Staff Present Heath Lark, RN, BSN, CCRP;Carroll Enterkin, RN, Levie Heritage, MA, ACSM RCEP, Exercise Physiologist   Supervising physician immediately available to respond to emergencies See telemetry face sheet for immediately available ER MD   Medication changes reported     No   Fall or balance concerns reported    No   Warm-up and Cool-down Performed on first and last piece of equipment   Resistance Training Performed Yes   VAD Patient? No     Pain Assessment   Currently in Pain? No/denies         Goals Met:  Exercise tolerated well No report of cardiac concerns or symptoms Strength training completed today  Goals Unmet:  Not Applicable  Comments: First full day of exercise!  Patient was oriented to gym and equipment including functions, settings, policies, and procedures.  Patient's individual exercise prescription and treatment plan were reviewed.  All starting workloads were established based on the results of the 6 minute walk test done at initial orientation visit.  The plan for exercise progression was also introduced and progression will be customized based on patient's performance and goals.     Dr. Emily Filbert is Medical Director for [place] and [name].

## 2016-03-11 ENCOUNTER — Encounter: Payer: Self-pay | Admitting: *Deleted

## 2016-03-11 DIAGNOSIS — Z955 Presence of coronary angioplasty implant and graft: Secondary | ICD-10-CM

## 2016-03-11 DIAGNOSIS — I213 ST elevation (STEMI) myocardial infarction of unspecified site: Secondary | ICD-10-CM

## 2016-03-18 ENCOUNTER — Encounter: Payer: PPO | Admitting: *Deleted

## 2016-03-18 DIAGNOSIS — I213 ST elevation (STEMI) myocardial infarction of unspecified site: Secondary | ICD-10-CM

## 2016-03-18 DIAGNOSIS — Z955 Presence of coronary angioplasty implant and graft: Secondary | ICD-10-CM

## 2016-03-18 NOTE — Progress Notes (Signed)
Daily Session Note  Patient Details  Name: Christopher Daniels MRN: [id number] Date of Birth: [birth year] Referring Provider:   Flowsheet Row Cardiac Rehab from 03/04/2016 in [facility] Cardiac and Pulmonary Rehab  Referring Provider  [name] MD      Encounter Date: 03/18/2016  Check In:     Session Check In - 03/18/16 0755      Check-In   Location [facility]-Cardiac & Pulmonary Rehab   Staff Present Gerlene Burdock, RN, Moises Blood, BS, ACSM CEP, Exercise Physiologist;Zay Yeargan Luan [place], [place], ACSM RCEP, Exercise Physiologist   Supervising physician immediately available to respond to emergencies See telemetry face sheet for immediately available ER MD   Medication changes reported     No   Fall or balance concerns reported    No   Warm-up and Cool-down Performed on first and last piece of equipment   Resistance Training Performed Yes   VAD Patient? No     Pain Assessment   Currently in Pain? No/denies   Multiple Pain Sites No         Goals Met:  Independence with exercise equipment Exercise tolerated well No report of cardiac concerns or symptoms Strength training completed today  Goals Unmet:  Not Applicable  Comments: Pt able to follow exercise prescription today without complaint.  Will continue to monitor for progression.    Dr. Emily Filbert is Medical Director for [name] and [name].

## 2016-03-20 DIAGNOSIS — Z955 Presence of coronary angioplasty implant and graft: Secondary | ICD-10-CM | POA: Diagnosis not present

## 2016-03-20 DIAGNOSIS — I213 ST elevation (STEMI) myocardial infarction of unspecified site: Secondary | ICD-10-CM

## 2016-03-20 NOTE — Progress Notes (Signed)
Daily Session Note  Patient Details  Name: Blanchard A Kerschner MRN: [id number] Date of Birth: 02/26/1934 Referring Provider:   Flowsheet Row Cardiac Rehab from 03/04/2016 in [facility] Cardiac and Pulmonary Rehab  Referring Provider  Ganji, Jay MD      Encounter Date: 03/20/2016  Check In:     Session Check In - 03/20/16 0821      Check-In   Location [facility]-Cardiac & Pulmonary Rehab   Staff Present Jessica Hawkins, MA, ACSM RCEP, Exercise Physiologist;Susanne Bice, RN, BSN, CCRP; , BA, ACSM CEP, Exercise Physiologist   Supervising physician immediately available to respond to emergencies See telemetry face sheet for immediately available ER MD   Medication changes reported     No   Fall or balance concerns reported    No   Warm-up and Cool-down Performed on first and last piece of equipment   Resistance Training Performed Yes   VAD Patient? No     Pain Assessment   Currently in Pain? No/denies           Exercise Prescription Changes - 03/19/16 1500      Exercise Review   Progression Yes     Response to Exercise   Blood Pressure (Admit) 126/62   Blood Pressure (Exercise) 134/76   Blood Pressure (Exit) 124/72   Heart Rate (Admit) 72 bpm   Heart Rate (Exercise) 114 bpm   Heart Rate (Exit) 85 bpm   Rating of Perceived Exertion (Exercise) 13   Symptoms bradycardia-has not been feeling as good over weekend   Duration Progress to 45 minutes of aerobic exercise without signs/symptoms of physical distress   Intensity THRR unchanged     Progression   Progression Continue to progress workloads to maintain intensity without signs/symptoms of physical distress.   Average METs 2.62     Resistance Training   Training Prescription Yes   Weight 3 lbs   Reps 10-15     Interval Training   Interval Training No     Treadmill   MPH 2.7   Grade 0.5   Minutes 15   METs 3.25     NuStep   Level 2   Minutes 15   METs 2.8     Recumbant Elliptical   Level 1   RPM 50   Minutes 15   METs 1.8      Goals Met:  Independence with exercise equipment Exercise tolerated well No report of cardiac concerns or symptoms Strength training completed today  Goals Unmet:  Not Applicable  Comments: Pt able to follow exercise prescription today without complaint.  Will continue to monitor for progression.    Dr. Mark Miller is Medical Director for [facility] and [name]. 

## 2016-03-21 DIAGNOSIS — I255 Ischemic cardiomyopathy: Secondary | ICD-10-CM | POA: Diagnosis not present

## 2016-03-21 DIAGNOSIS — R001 Bradycardia, unspecified: Secondary | ICD-10-CM | POA: Diagnosis not present

## 2016-03-22 ENCOUNTER — Encounter: Payer: PPO | Admitting: *Deleted

## 2016-03-22 DIAGNOSIS — Z955 Presence of coronary angioplasty implant and graft: Secondary | ICD-10-CM

## 2016-03-22 DIAGNOSIS — I213 ST elevation (STEMI) myocardial infarction of unspecified site: Secondary | ICD-10-CM

## 2016-03-22 NOTE — Progress Notes (Signed)
Daily Session Note  Patient Details  Name: Christopher Daniels MRN: [id number] Date of Birth: Jul 13, 1934 Referring Provider:   Flowsheet Row Cardiac Rehab from 03/04/2016 in [facility] Cardiac and Pulmonary Rehab  Referring Provider  [name] MD      Encounter Date: 03/22/2016  Check In:     Session Check In - 03/22/16 0840      Check-In   Location [facility]-Cardiac & Pulmonary Rehab   Staff Present Gerlene Burdock, RN, Levie Heritage, MA, ACSM RCEP, Exercise Physiologist;Other   Supervising physician immediately available to respond to emergencies See telemetry face sheet for immediately available ER MD   Medication changes reported     Yes   Comments coreg changed to 25m BID   Fall or balance concerns reported    No   Warm-up and Cool-down Performed on first and last piece of equipment   Resistance Training Performed Yes   VAD Patient? No     Pain Assessment   Currently in Pain? No/denies   Multiple Pain Sites No         Goals Met:  Independence with exercise equipment Exercise tolerated well No report of cardiac concerns or symptoms Strength training completed today  Goals Unmet:  Not Applicable  Comments: Pt able to follow exercise prescription today without complaint.  Will continue to monitor for progression.    Dr. MEmily Filbertis Medical Director for [name].

## 2016-03-25 ENCOUNTER — Encounter: Payer: PPO | Admitting: *Deleted

## 2016-03-25 DIAGNOSIS — Z955 Presence of coronary angioplasty implant and graft: Secondary | ICD-10-CM

## 2016-03-25 DIAGNOSIS — I213 ST elevation (STEMI) myocardial infarction of unspecified site: Secondary | ICD-10-CM

## 2016-03-25 NOTE — Progress Notes (Signed)
Daily Session Note  Patient Details  Name: Christopher Daniels MRN: [id number] Date of Birth: [birth year] Referring Provider:   Flowsheet Row Cardiac Rehab from 03/04/2016 in [facility] Cardiac and Pulmonary Rehab  Referring Provider  [name] MD      Encounter Date: 03/25/2016  Check In:     Session Check In - 03/25/16 0741      Check-In   Location [facility]-Cardiac & Pulmonary Rehab   Staff Present Alberteen Sam, MA, ACSM RCEP, Exercise Physiologist;Kelly Amedeo Plenty, BS, ACSM CEP, Exercise Physiologist;Other;Carroll Enterkin, RN, BSN   Supervising physician immediately available to respond to emergencies See telemetry face sheet for immediately available ER MD   Medication changes reported     No   Fall or balance concerns reported    No   Warm-up and Cool-down Performed on first and last piece of equipment   Resistance Training Performed Yes   VAD Patient? No     Pain Assessment   Currently in Pain? No/denies   Multiple Pain Sites No         Goals Met:  Independence with exercise equipment Exercise tolerated well No report of cardiac concerns or symptoms Strength training completed today  Goals Unmet:  Not Applicable  Comments: Pt able to follow exercise prescription today without complaint.  Will continue to monitor for progression.    Dr. Emily Filbert is Medical Director for [place] and [name].

## 2016-03-27 ENCOUNTER — Encounter: Payer: Self-pay | Admitting: *Deleted

## 2016-03-27 DIAGNOSIS — I213 ST elevation (STEMI) myocardial infarction of unspecified site: Secondary | ICD-10-CM

## 2016-03-27 DIAGNOSIS — Z955 Presence of coronary angioplasty implant and graft: Secondary | ICD-10-CM | POA: Diagnosis not present

## 2016-03-27 NOTE — Progress Notes (Signed)
Cardiac Individual Treatment Plan  Patient Details  Name: Christopher Daniels MRN: [id number] Date of Birth: [birth year] Referring Provider:   Flowsheet Row Cardiac Rehab from 03/04/2016 in [facility] Cardiac and Pulmonary Rehab  Referring Provider  [name] MD      Initial Encounter Date:  Flowsheet Row Cardiac Rehab from 03/04/2016 in [facility] Cardiac and Pulmonary Rehab  Date  03/04/16  Referring Provider  [name] MD      Visit Diagnosis: ST elevation myocardial infarction (STEMI), unspecified artery [facility])  Status post coronary artery stent placement  Patient's Home Medications on Admission:  Current Outpatient Prescriptions:  .  aspirin 81 MG tablet, Take 81 mg by mouth every evening. , Disp: , Rfl:  .  atorvastatin (LIPITOR) 80 MG tablet, Take 1 tablet (80 mg total) by mouth every evening., Disp: 30 tablet, Rfl: 3 .  carboxymethylcellulose (REFRESH TEARS) 0.5 % SOLN, Place 1 drop into both eyes as needed (for dry eyes)., Disp: , Rfl:  .  carvedilol (COREG) 6.25 MG tablet, Take 1 tablet (6.25 mg total) by mouth 2 (two) times daily with a meal., Disp: 60 tablet, Rfl: 2 .  co-enzyme Q-10 30 MG capsule, Take 30 mg by mouth daily., Disp: , Rfl:  .  lisinopril (PRINIVIL,ZESTRIL) 10 MG tablet, Take 1 tablet (10 mg total) by mouth daily., Disp: 30 tablet, Rfl: 2 .  nitroGLYCERIN (NITROSTAT) 0.4 MG SL tablet, Place 1 tablet (0.4 mg total) under the tongue every 5 (five) minutes x 3 doses as needed for chest pain., Disp: 25 tablet, Rfl: 3 .  omeprazole (PRILOSEC) 20 MG capsule, Take 20 mg by mouth every evening. , Disp: , Rfl:  .  ticagrelor (BRILINTA) 90 MG TABS tablet, Take 1 tablet (90 mg total) by mouth 2 (two) times daily., Disp: 60 tablet, Rfl: 0  Past Medical History: Past Medical History:  Diagnosis Date  . Arthritis    joints  . Cancer ([place])    skin/CLL. on head years ago  . Diverticulitis   . Dysrhythmia    bradycardia  . GERD (gastroesophageal reflux disease)   . H/O Bell's palsy    . HOH (hard of hearing)   . Kidney calculi    stones from years ago  . LBBB (left bundle branch block)     Tobacco Use: History  Smoking Status  . Never Smoker  Smokeless Tobacco  . Not on file    Labs: Recent Review Flowsheet Data    Labs for ITP Cardiac and Pulmonary Rehab Latest Ref Rng & Units 01/27/2016 01/27/2016 01/27/2016 01/27/2016 01/28/2016   Cholestrol 0 - 200 mg/dL - - 138 141 -   LDLCALC 0 - 99 mg/dL - - 90 103(H) -   HDL >40 mg/dL - - 29(L) 32(L) -   Trlycerides <150 mg/dL - - 93 32 -   Hemoglobin A1c 4.8 - 5.6 % - 5.7(H) - - 5.5   TCO2 0 - 100 mmol/L 24 - - - -       Exercise Target Goals:    Exercise Program Goal: Individual exercise prescription set with THRR, safety & activity barriers. Participant demonstrates ability to understand and report RPE using BORG scale, to self-measure pulse accurately, and to acknowledge the importance of the exercise prescription.  Exercise Prescription Goal: Starting with aerobic activity 30 plus minutes a day, 3 days per week for initial exercise prescription. Provide home exercise prescription and guidelines that participant acknowledges understanding prior to discharge.  Activity Barriers & Risk Stratification:  Activity Barriers & Cardiac Risk Stratification - 03/04/16 1350      Activity Barriers & Cardiac Risk Stratification   Activity Barriers None   Cardiac Risk Stratification High      6 Minute Walk:     6 Minute Walk    Row Name 03/04/16 1508         6 Minute Walk   Phase Initial     Distance 1570 feet     Walk Time 6 minutes     # of Rest Breaks 0     MPH 2.97     METS 3.36     RPE 12     VO2 Peak 11.78     Symptoms No     Resting HR 80 bpm     Resting BP 106/56     Max Ex. HR 112 bpm     Max Ex. BP 166/60     2 Minute Post BP 126/70        Initial Exercise Prescription:     Initial Exercise Prescription - 03/04/16 1500      Date of Initial Exercise RX and Referring Provider    Date 03/04/16   Referring Provider [name] MD     Treadmill   MPH 2.7   Grade 0.5   Minutes 15   METs 3.25     NuStep   Level 2   Watts --  80-100 spm   Minutes 15   METs 2     Recumbant Elliptical   Level 1   RPM 50   Minutes 15   METs 2     Prescription Details   Frequency (times per week) 3   Duration Progress to 45 minutes of aerobic exercise without signs/symptoms of physical distress     Intensity   THRR 40-80% of Max Heartrate 104-129   Ratings of Perceived Exertion 11-15   Perceived Dyspnea 0-4     Progression   Progression Continue to progress workloads to maintain intensity without signs/symptoms of physical distress.     Resistance Training   Training Prescription Yes   Weight 3 lbs   Reps 10-15      Perform Capillary Blood Glucose checks as needed.  Exercise Prescription Changes:     Exercise Prescription Changes    Row Name 03/04/16 1300 03/19/16 1500           Exercise Review   Progression -  walk test results Yes        Response to Exercise   Blood Pressure (Admit) 106/56 126/62      Blood Pressure (Exercise) 166/60 134/76      Blood Pressure (Exit) 126/70 124/72      Heart Rate (Admit) 80 bpm 72 bpm      Heart Rate (Exercise) 112 bpm 114 bpm      Heart Rate (Exit) 88 bpm 85 bpm      Oxygen Saturation (Admit) 98 %  -      Oxygen Saturation (Exercise) 91 %  -      Rating of Perceived Exertion (Exercise) 12 13      Symptoms none bradycardia-has not been feeling as good over weekend      Duration  - Progress to 45 minutes of aerobic exercise without signs/symptoms of physical distress      Intensity  - THRR unchanged        Progression   Progression  - Continue to progress workloads to maintain intensity without signs/symptoms of physical  distress.      Average METs  - 2.62        Resistance Training   Training Prescription  - Yes      Weight  - 3 lbs      Reps  - 10-15        Interval Training   Interval Training  - No         Treadmill   MPH  - 2.7      Grade  - 0.5      Minutes  - 15      METs  - 3.25        NuStep   Level  - 2      Minutes  - 15      METs  - 2.8        Recumbant Elliptical   Level  - 1      RPM  - 50      Minutes  - 15      METs  - 1.8         Exercise Comments:     Exercise Comments    Row Name 03/19/16 1521 03/19/16 1522         Exercise Comments First full day of exercise!  Patient was oriented to gym and equipment including functions, settings, policies, and procedures.  Patient's individual exercise prescription and treatment plan were reviewed.  All starting workloads were established based on the results of the 6 minute walk test done at initial orientation visit.  The plan for exercise progression was also introduced and progression will be customized based on patient's performance and goals. Corde is off to a good start with exercise.  He is only on day two for exercise.  He has had some chest pain and rhythm concerens that are being followed by both Dr. Einar Gip and Dr. Sabra Heck.  We will continue to monitor him closely and track his progression.         Discharge Exercise Prescription (Final Exercise Prescription Changes):     Exercise Prescription Changes - 03/19/16 1500      Exercise Review   Progression Yes     Response to Exercise   Blood Pressure (Admit) 126/62   Blood Pressure (Exercise) 134/76   Blood Pressure (Exit) 124/72   Heart Rate (Admit) 72 bpm   Heart Rate (Exercise) 114 bpm   Heart Rate (Exit) 85 bpm   Rating of Perceived Exertion (Exercise) 13   Symptoms bradycardia-has not been feeling as good over weekend   Duration Progress to 45 minutes of aerobic exercise without signs/symptoms of physical distress   Intensity THRR unchanged     Progression   Progression Continue to progress workloads to maintain intensity without signs/symptoms of physical distress.   Average METs 2.62     Resistance Training   Training Prescription Yes   Weight  3 lbs   Reps 10-15     Interval Training   Interval Training No     Treadmill   MPH 2.7   Grade 0.5   Minutes 15   METs 3.25     NuStep   Level 2   Minutes 15   METs 2.8     Recumbant Elliptical   Level 1   RPM 50   Minutes 15   METs 1.8      Nutrition:  Target Goals: Understanding of nutrition guidelines, daily intake of sodium '1500mg'$ , cholesterol '200mg'$ , calories 30% from fat and 7% or less  from saturated fats, daily to have 5 or more servings of fruits and vegetables.  Biometrics:     Pre Biometrics - 03/04/16 1512      Pre Biometrics   Height 5\' 10"  (1.778 m)   Weight 188 lb 4.8 oz (85.4 kg)   Waist Circumference 37 inches   Hip Circumference 41 inches   Waist to Hip Ratio 0.9 %   BMI (Calculated) 27.1   Single Leg Stand 3.28 seconds       Nutrition Therapy Plan and Nutrition Goals:     Nutrition Therapy & Goals - 03/04/16 1347      Intervention Plan   Intervention Prescribe, educate and counsel regarding individualized specific dietary modifications aiming towards targeted core components such as weight, hypertension, lipid management, diabetes, heart failure and other comorbidities.   Expected Outcomes Short Term Goal: Understand basic principles of dietary content, such as calories, fat, sodium, cholesterol and nutrients.;Short Term Goal: A plan has been developed with personal nutrition goals set during dietitian appointment.;Long Term Goal: Adherence to prescribed nutrition plan.      Nutrition Discharge: Rate Your Plate Scores:     Nutrition Assessments - 03/04/16 1347      MEDFICTS Scores   Pre Score 39      Nutrition Goals Re-Evaluation:   Psychosocial: Target Goals: Acknowledge presence or absence of depression, maximize coping skills, provide positive support system. Participant is able to verbalize types and ability to use techniques and skills needed for reducing stress and depression.  Initial Review & Psychosocial Screening:      Initial Psych Review & Screening - 03/04/16 1348      Initial Review   Current issues with --  None reported     Family Dynamics   Good Support System? Yes  Wife, church, family     Barriers   Psychosocial barriers to participate in program There are no identifiable barriers or psychosocial needs.;The patient should benefit from training in stress management and relaxation.     Screening Interventions   Interventions Encouraged to exercise      Quality of Life Scores:     Quality of Life - 03/04/16 1349      Quality of Life Scores   Health/Function Pre 25.29 %   Socioeconomic Pre 27.75 %   Psych/Spiritual Pre 28.29 %   Family Pre 27.6 %   GLOBAL Pre 26.77 %      PHQ-9: Recent Review Flowsheet Data    Depression screen [facility] 2/9 03/04/2016 01/30/2016   Decreased Interest 0 0   Down, Depressed, Hopeless 0 0   PHQ - 2 Score 0 0   Altered sleeping 0 -   Tired, decreased energy 0 -   Change in appetite 0 -   Feeling bad or failure about yourself  0 -   Trouble concentrating 0 -   Moving slowly or fidgety/restless 0 -   Suicidal thoughts 0 -   PHQ-9 Score 0 -   Difficult doing work/chores Not difficult at all -      Psychosocial Evaluation and Intervention:     Psychosocial Evaluation - 03/25/16 0905      Psychosocial Evaluation & Interventions   Interventions Encouraged to exercise with the program and follow exercise prescription;Stress management education   Comments Counselor met with Mr. Mccabe today for initial psychosocial evaluation.  He is a 82 year old who had a heart attack early December and has had (2) stents inserted since then.  He has a strong  support system with a spouse of 64 years; an adult daughter who lives next door and also Mr. s is actively involved in his local church community.  He reports sleeping well with a good appetite.  He denies a history or current symptoms of depression or anxiety, and states his mood is generally positive most of  the time.  Mr. 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  He feels confident this is improving as she is being enrolled in a water rehab program that should help her strength and ability to participate in more normal household duties.  Mr. Chauncey Cruel has goals in this program to stabilize his heart so he won't have to wear the external defibrilator.   Staff will continue to follow with Mr. Gikas throughout the course of this program.        Psychosocial Re-Evaluation:   Vocational Rehabilitation: Provide vocational rehab assistance to qualifying candidates.   Vocational Rehab Evaluation & Intervention:     Vocational Rehab - 03/04/16 1351      Initial Vocational Rehab Evaluation & Intervention   Assessment shows need for Vocational Rehabilitation No      Education: Education Goals: Education classes will be provided on a weekly basis, covering required topics. Participant will state understanding/return demonstration of topics presented.  Learning Barriers/Preferences:     Learning Barriers/Preferences - 03/04/16 1350      Learning Barriers/Preferences   Learning Barriers None   Learning Preferences None      Education Topics: General Nutrition Guidelines/Fats and Fiber: -Group instruction provided by verbal, written material, models and posters to present the general guidelines for heart healthy nutrition. Gives an explanation and review of dietary fats and fiber.   Controlling Sodium/Reading Food Labels: -Group verbal and written material supporting the discussion of sodium use in heart healthy nutrition. Review and explanation with models, verbal and written materials for utilization of the food label.   Exercise Physiology & Risk Factors: - Group verbal and written instruction with models to review the exercise physiology of the cardiovascular system and associated critical  values. Details cardiovascular disease risk factors and the goals associated with each risk factor. Flowsheet Row Cardiac Rehab from 03/25/2016 in [facility] Cardiac and Pulmonary Rehab  Date  03/18/16  Educator  [facility]  Instruction Review Code  2- meets goals/outcomes      Aerobic Exercise & Resistance Training: - Gives group verbal and written discussion on the health impact of inactivity. On the components of aerobic and resistive training programs and the benefits of this training and how to safely progress through these programs. Flowsheet Row Cardiac Rehab from 03/25/2016 in [facility] Cardiac and Pulmonary Rehab  Date  03/20/16  Educator  [facility]  Instruction Review Code  2- meets goals/outcomes      Flexibility, Balance, General Exercise Guidelines: - Provides group verbal and written instruction on the benefits of flexibility and balance training programs. Provides general exercise guidelines with specific guidelines to those with heart or lung disease. Demonstration and skill practice provided. Flowsheet Row Cardiac Rehab from 03/25/2016 in [facility] Cardiac and Pulmonary Rehab  Date  03/25/16  Educator  [place]  Instruction Review Code  2- meets goals/outcomes      Stress Management: - Provides group verbal and written instruction about the health risks of elevated stress, cause of high stress, and healthy ways to reduce stress.   Depression: - Provides group  verbal and written instruction on the correlation between heart/lung disease and depressed mood, treatment options, and the stigmas associated with seeking treatment.   Anatomy & Physiology of the Heart: - Group verbal and written instruction and models provide basic cardiac anatomy and physiology, with the coronary electrical and arterial systems. Review of: AMI, Angina, Valve disease, Heart Failure, Cardiac Arrhythmia, Pacemakers, and the ICD.   Cardiac Procedures: - Group verbal and written instruction and models to describe the testing  methods done to diagnose heart disease. Reviews the outcomes of the test results. Describes the treatment choices: Medical Management, Angioplasty, or Coronary Bypass Surgery.   Cardiac Medications: - Group verbal and written instruction to review commonly prescribed medications for heart disease. Reviews the medication, class of the drug, and side effects. Includes the steps to properly store meds and maintain the prescription regimen.   Go Sex-Intimacy & Heart Disease, Get SMART - Goal Setting: - Group verbal and written instruction through game format to discuss heart disease and the return to sexual intimacy. Provides group verbal and written material to discuss and apply goal setting through the application of the S.M.A.R.T. Method.   Other Matters of the Heart: - Provides group verbal, written materials and models to describe Heart Failure, Angina, Valve Disease, and Diabetes in the realm of heart disease. Includes description of the disease process and treatment options available to the cardiac patient.   Exercise & Equipment Safety: - Individual verbal instruction and demonstration of equipment use and safety with use of the equipment. Flowsheet Row Cardiac Rehab from 03/25/2016 in [facility] Cardiac and Pulmonary Rehab  Date  03/04/16  Educator  [facility]  Instruction Review Code  2- meets goals/outcomes      Infection Prevention: - Provides verbal and written material to individual with discussion of infection control including proper hand washing and proper equipment cleaning during exercise session. Flowsheet Row Cardiac Rehab from 03/25/2016 in [facility] Cardiac and Pulmonary Rehab  Date  03/04/16  Educator  Sb  Instruction Review Code  2- meets goals/outcomes      Falls Prevention: - Provides verbal and written material to individual with discussion of falls prevention and safety. Flowsheet Row Cardiac Rehab from 03/25/2016 in [facility] Cardiac and Pulmonary Rehab  Date  03/04/16  Educator   [facility]  Instruction Review Code  2- meets goals/outcomes      Diabetes: - Individual verbal and written instruction to review signs/symptoms of diabetes, desired ranges of glucose level fasting, after meals and with exercise. Advice that pre and post exercise glucose checks will be done for 3 sessions at entry of program.    Knowledge Questionnaire Score:     Knowledge Questionnaire Score - 03/04/16 1351      Knowledge Questionnaire Score   Pre Score 23/28  Reviewed correct responses with Eulas Post today      Core Components/Risk Factors/Patient Goals at Admission:     Personal Goals and Risk Factors at Admission - 03/04/16 1347      Core Components/Risk Factors/Patient Goals on Admission    Weight Management Yes;Weight Loss   Intervention Weight Management: Develop a combined nutrition and exercise program designed to reach desired caloric intake, while maintaining appropriate intake of nutrient and fiber, sodium and fats, and appropriate energy expenditure required for the weight goal.;Weight Management: Provide education and appropriate resources to help participant work on and attain dietary goals.   Admit Weight 188 lb 4.8 oz (85.4 kg)   Goal Weight: Short Term 185 lb (83.9 kg)  Goal Weight: Long Term 180 lb (81.6 kg)   Expected Outcomes Long Term: Adherence to nutrition and physical activity/exercise program aimed toward attainment of established weight goal;Short Term: Continue to assess and modify interventions until short term weight is achieved;Weight Loss: Understanding of general recommendations for a balanced deficit meal plan, which promotes 1-2 lb weight loss per week and includes a negative energy balance of [phone] kcal/d;Understanding recommendations for meals to include 15-35% energy as protein, 25-35% energy from fat, 35-60% energy from carbohydrates, less than '200mg'$  of dietary cholesterol, 20-35 gm of total fiber daily;Understanding of distribution of calorie intake  throughout the day with the consumption of 4-5 meals/snacks   Sedentary Yes   Intervention Develop an individualized exercise prescription for aerobic and resistive training based on initial evaluation findings, risk stratification, comorbidities and participant's personal goals.;Provide advice, education, support and counseling about physical activity/exercise needs.   Expected Outcomes Achievement of increased cardiorespiratory fitness and enhanced flexibility, muscular endurance and strength shown through measurements of functional capacity and personal statement of participant.   Increase Strength and Stamina Yes   Intervention Provide advice, education, support and counseling about physical activity/exercise needs.;Develop an individualized exercise prescription for aerobic and resistive training based on initial evaluation findings, risk stratification, comorbidities and participant's personal goals.   Expected Outcomes Achievement of increased cardiorespiratory fitness and enhanced flexibility, muscular endurance and strength shown through measurements of functional capacity and personal statement of participant.   Hypertension Yes   Intervention Provide education on lifestyle modifcations including regular physical activity/exercise, weight management, moderate sodium restriction and increased consumption of fresh fruit, vegetables, and low fat dairy, alcohol moderation, and smoking cessation.;Monitor prescription use compliance.   Expected Outcomes Short Term: Continued assessment and intervention until BP is < 140/66m HG in hypertensive participants. < 130/87mHG in hypertensive participants with diabetes, heart failure or chronic kidney disease.;Long Term: Maintenance of blood pressure at goal levels.   Lipids Yes   Intervention Provide education and support for participant on nutrition & aerobic/resistive exercise along with prescribed medications to achieve LDL '70mg'$ , HDL >'40mg'$ .   Expected  Outcomes Short Term: Participant states understanding of desired cholesterol values and is compliant with medications prescribed. Participant is following exercise prescription and nutrition guidelines.;Long Term: Cholesterol controlled with medications as prescribed, with individualized exercise RX and with personalized nutrition plan. Value goals: LDL < '70mg'$ , HDL > 40 mg.      Core Components/Risk Factors/Patient Goals Review:    Core Components/Risk Factors/Patient Goals at Discharge (Final Review):    ITP Comments:     ITP Comments    Row Name 03/04/16 1338 03/08/16 0818 03/11/16 0847 03/18/16 1007 03/20/16 [id number] ITP Comments Med review completed. Initial ITP created.  [facility] Appointment made to meet with RD on 03/11/2016 GlCleoname to rehab this morning and was complaining of chest pain/pressure over the weekend.  He said that it was left sided and different from what he was having previously.  He was sent to see his cardiologist for evaluation. I took the heart rate rhythm strips of 44 for Dr. MaEmily Filberto see. We also did an EKG which he saw. I told Dr. MiLynnae Prudeaid their are [facility] Dr. MiSabra Heckold GlAlo stop his Coreg (CArvidolol). Dr. MiSabra Heckill ask his staff to call and make GlLannien appt to see Dr. MiSabra Heckn a few days. Life Vest is on.  Eulas Post sess Dr Sabra Heck tomorrow - he has decreased Coreg by 1/2 to wean off per prescription instruction.   Bee Name 03/27/16 0603           ITP Comments 30 day review. Continue with ITP unless directed changes per Medical Director review.          Comments:

## 2016-03-27 NOTE — Progress Notes (Signed)
Daily Session Note  Patient Details  Name: Christopher Daniels MRN: [id number] Date of Birth: 1934/03/17 Referring Provider:   Flowsheet Row Cardiac Rehab from 03/04/2016 in [facility] Cardiac and Pulmonary Rehab  Referring Provider  [name] MD      Encounter Date: 03/27/2016  Check In:     Session Check In - 03/27/16 0901      Check-In   Location [facility]-Cardiac & Pulmonary Rehab   Staff Present Nyoka Cowden, RN, BSN, Willette Pa, MA, ACSM RCEP, Exercise Physiologist;Amanda Oletta Darter, [place], ACSM CEP, Exercise Physiologist   Supervising physician immediately available to respond to emergencies See telemetry face sheet for immediately available ER MD   Medication changes reported     No   Fall or balance concerns reported    No   Warm-up and Cool-down Performed on first and last piece of equipment   Resistance Training Performed Yes   VAD Patient? No     Pain Assessment   Currently in Pain? No/denies   Multiple Pain Sites No         Goals Met:  Independence with exercise equipment Exercise tolerated well No report of cardiac concerns or symptoms Strength training completed today  Goals Unmet:  Not Applicable  Comments: Pt able to follow exercise prescription today without complaint.  Will continue to monitor for progression.    Dr. Emily Filbert is Medical Director for [place] and [name].

## 2016-03-29 ENCOUNTER — Encounter: Payer: PPO | Attending: Cardiology | Admitting: *Deleted

## 2016-03-29 DIAGNOSIS — Z955 Presence of coronary angioplasty implant and graft: Secondary | ICD-10-CM | POA: Diagnosis not present

## 2016-03-29 DIAGNOSIS — I213 ST elevation (STEMI) myocardial infarction of unspecified site: Secondary | ICD-10-CM

## 2016-03-29 NOTE — Progress Notes (Signed)
Daily Session Note  Patient Details  Name: MAKARI PORTMAN MRN: [id number] Date of Birth: 05-08-1934 Referring Provider:   Flowsheet Row Cardiac Rehab from 03/04/2016 in [facility] Cardiac and Pulmonary Rehab  Referring Provider  [name] MD      Encounter Date: 03/29/2016  Check In:     Session Check In - 03/29/16 0837      Check-In   Location [facility]-Cardiac & Pulmonary Rehab   Staff Present Gerlene Burdock, RN, Levie Heritage, MA, ACSM RCEP, Exercise Physiologist;Susanne Bice, RN, BSN, CCRP   Supervising physician immediately available to respond to emergencies See telemetry face sheet for immediately available ER MD   Medication changes reported     No   Fall or balance concerns reported    No   Warm-up and Cool-down Performed on first and last piece of equipment   Resistance Training Performed Yes   VAD Patient? No     Pain Assessment   Currently in Pain? No/denies         Goals Met:  Proper associated with RPD/PD & O2 Sat Exercise tolerated well  Goals Unmet:  Not Applicable  Comments:     Dr. Emily Filbert is Medical Director for [place] and [name].

## 2016-04-01 ENCOUNTER — Encounter: Payer: PPO | Admitting: *Deleted

## 2016-04-01 DIAGNOSIS — Z955 Presence of coronary angioplasty implant and graft: Secondary | ICD-10-CM | POA: Diagnosis not present

## 2016-04-01 DIAGNOSIS — I213 ST elevation (STEMI) myocardial infarction of unspecified site: Secondary | ICD-10-CM

## 2016-04-01 NOTE — Progress Notes (Signed)
Daily Session Note  Patient Details  Name: Christopher Daniels MRN: [id number] Date of Birth: 1934-06-01 Referring Provider:   Flowsheet Row Cardiac Rehab from 03/04/2016 in [facility] Cardiac and Pulmonary Rehab  Referring Provider  [name] MD      Encounter Date: 04/01/2016  Check In:     Session Check In - 04/01/16 0924      Check-In   Location [facility]-Cardiac & Pulmonary Rehab   Staff Present Alberteen Sam, MA, ACSM RCEP, Exercise Physiologist;Mary Kellie Shropshire, RN, BSN, [place], BS, ACSM CEP, Exercise Physiologist   Supervising physician immediately available to respond to emergencies See telemetry face sheet for immediately available ER MD   Medication changes reported     No   Fall or balance concerns reported    No   Warm-up and Cool-down Performed on first and last piece of equipment   Resistance Training Performed Yes   VAD Patient? No     Pain Assessment   Currently in Pain? No/denies   Multiple Pain Sites No         Goals Met:  Independence with exercise equipment Exercise tolerated well No report of cardiac concerns or symptoms Strength training completed today  Goals Unmet:  Not Applicable  Comments: Pt able to follow exercise prescription today without complaint.  Will continue to monitor for progression.    Dr. Emily Filbert is Medical Director for [place] and [name].

## 2016-04-03 ENCOUNTER — Encounter: Payer: PPO | Admitting: *Deleted

## 2016-04-03 DIAGNOSIS — Z955 Presence of coronary angioplasty implant and graft: Secondary | ICD-10-CM

## 2016-04-03 DIAGNOSIS — I213 ST elevation (STEMI) myocardial infarction of unspecified site: Secondary | ICD-10-CM

## 2016-04-03 NOTE — Progress Notes (Signed)
Daily Session Note  Patient Details  Name: ISEAH PLOUFF MRN: [id number] Date of Birth: 13-Aug-1934 Referring Provider:   Flowsheet Row Cardiac Rehab from 03/04/2016 in [facility] Cardiac and Pulmonary Rehab  Referring Provider  [name] MD      Encounter Date: 04/03/2016  Check In:     Session Check In - 04/03/16 0854      Check-In   Location [facility]-Cardiac & Pulmonary Rehab   Staff Present Alberteen Sam, MA, ACSM RCEP, Exercise Physiologist;Carroll Enterkin, RN, Vickki Hearing, BA, ACSM CEP, Exercise Physiologist   Supervising physician immediately available to respond to emergencies See telemetry face sheet for immediately available ER MD   Medication changes reported     Yes   Comments reduced lisinopril to 32m a day   Fall or balance concerns reported    No   Warm-up and Cool-down Performed on first and last piece of equipment   Resistance Training Performed Yes   VAD Patient? No     Pain Assessment   Currently in Pain? No/denies   Multiple Pain Sites No         Goals Met:  Independence with exercise equipment Exercise tolerated well No report of cardiac concerns or symptoms Strength training completed today  Goals Unmet:  Not Applicable  Comments: Pt able to follow exercise prescription today without complaint.  Will continue to monitor for progression. Reviewed home exercise with pt today.  Pt plans to walk at home for exercise.  Reviewed THR, pulse, RPE, sign and symptoms, NTG use, and when to call 911 or MD.  Also discussed weather considerations and indoor options.  Pt voiced understanding.    Dr. MEmily Filbertis Medical Director for [place] [name].

## 2016-04-04 DIAGNOSIS — I213 ST elevation (STEMI) myocardial infarction of unspecified site: Secondary | ICD-10-CM | POA: Diagnosis not present

## 2016-04-05 ENCOUNTER — Encounter: Payer: PPO | Admitting: *Deleted

## 2016-04-05 DIAGNOSIS — Z955 Presence of coronary angioplasty implant and graft: Secondary | ICD-10-CM | POA: Diagnosis not present

## 2016-04-05 DIAGNOSIS — I213 ST elevation (STEMI) myocardial infarction of unspecified site: Secondary | ICD-10-CM

## 2016-04-05 NOTE — Progress Notes (Signed)
Daily Session Note  Patient Details  Name: Christopher Daniels MRN: [id number] Date of Birth: 08-Mar-1934 Referring Provider:   Flowsheet Row Cardiac Rehab from 03/04/2016 in [facility] Cardiac and Pulmonary Rehab  Referring Provider  [name] MD      Encounter Date: 04/05/2016  Check In:     Session Check In - 04/05/16 0846      Check-In   Staff Present Heath Lark, RN, BSN, CCRP;Carroll Enterkin, RN, Levie Heritage, MA, ACSM RCEP, Exercise Physiologist   Supervising physician immediately available to respond to emergencies See telemetry face sheet for immediately available ER MD   Medication changes reported     No   Fall or balance concerns reported    No   Warm-up and Cool-down Performed on first and last piece of equipment   Resistance Training Performed Yes   VAD Patient? No     Pain Assessment   Currently in Pain? No/denies         Goals Met:  Independence with exercise equipment Exercise tolerated well Personal goals reviewed No report of cardiac concerns or symptoms Strength training completed today  Goals Unmet:  Not Applicable  Comments: Doing well with exercise prescription progression.    Dr. Emily Filbert is Medical Director for [place] and [name].

## 2016-04-08 ENCOUNTER — Encounter: Payer: PPO | Admitting: *Deleted

## 2016-04-08 DIAGNOSIS — I213 ST elevation (STEMI) myocardial infarction of unspecified site: Secondary | ICD-10-CM

## 2016-04-08 DIAGNOSIS — Z955 Presence of coronary angioplasty implant and graft: Secondary | ICD-10-CM | POA: Diagnosis not present

## 2016-04-08 NOTE — Progress Notes (Signed)
Daily Session Note  Patient Details  Name: Christopher Daniels MRN: [id number] Date of Birth: 25-Dec-1934 Referring Provider:   Flowsheet Row Cardiac Rehab from 03/04/2016 in [facility] Cardiac and Pulmonary Rehab  Referring Provider  [name] MD      Encounter Date: 04/08/2016  Check In:     Session Check In - 04/08/16 0803      Check-In   Staff Present Nyoka Cowden, RN, BSN, MA;Audreyana Huntsberry, RN, BSN, CCRP;Carroll Enterkin, RN, BSN   Supervising physician immediately available to respond to emergencies See telemetry face sheet for immediately available ER MD   Medication changes reported     No   Fall or balance concerns reported    No   Warm-up and Cool-down Performed on first and last piece of equipment   Resistance Training Performed Yes   VAD Patient? No     Pain Assessment   Currently in Pain? No/denies         Goals Met:  Exercise tolerated well No report of cardiac concerns or symptoms Strength training completed today  Goals Unmet:  Not Applicable  Comments: Doing well with exercise prescription progression.   See ITP comments   Dr. Emily Filbert is Medical Director for [place] and [name].

## 2016-04-10 ENCOUNTER — Encounter: Payer: PPO | Admitting: *Deleted

## 2016-04-10 DIAGNOSIS — Z955 Presence of coronary angioplasty implant and graft: Secondary | ICD-10-CM | POA: Diagnosis not present

## 2016-04-10 DIAGNOSIS — I213 ST elevation (STEMI) myocardial infarction of unspecified site: Secondary | ICD-10-CM

## 2016-04-10 NOTE — Progress Notes (Signed)
Daily Session Note  Patient Details  Name: Christopher Daniels MRN: [id number] Date of Birth: 25-May-1934 Referring Provider:   Flowsheet Row Cardiac Rehab from 03/04/2016 in [facility] Cardiac and Pulmonary Rehab  Referring Provider  [name] MD      Encounter Date: 04/10/2016  Check In:     Session Check In - 04/10/16 0925      Check-In   Location [facility]-Cardiac & Pulmonary Rehab   Staff Present Alberteen Sam, MA, ACSM RCEP, Exercise Physiologist;Susanne Bice, RN, BSN, Lance Sell, BA, ACSM CEP, Exercise Physiologist   Supervising physician immediately available to respond to emergencies See telemetry face sheet for immediately available ER MD   Medication changes reported     No   Fall or balance concerns reported    No   Warm-up and Cool-down Performed on first and last piece of equipment   Resistance Training Performed Yes   VAD Patient? No     Pain Assessment   Currently in Pain? No/denies   Multiple Pain Sites No         Goals Met:  Independence with exercise equipment Exercise tolerated well No report of cardiac concerns or symptoms Strength training completed today  Goals Unmet:  Not Applicable  Comments: Pt able to follow exercise prescription today without complaint.  Will continue to monitor for progression.    Dr. Emily Filbert is Medical Director for [place] and [name].

## 2016-04-12 ENCOUNTER — Encounter: Payer: PPO | Admitting: *Deleted

## 2016-04-12 DIAGNOSIS — I213 ST elevation (STEMI) myocardial infarction of unspecified site: Secondary | ICD-10-CM

## 2016-04-12 DIAGNOSIS — Z955 Presence of coronary angioplasty implant and graft: Secondary | ICD-10-CM | POA: Diagnosis not present

## 2016-04-12 NOTE — Progress Notes (Signed)
Daily Session Note  Patient Details  Name: TYRION GLAUDE MRN: [id number] Date of Birth: 12/21/1934 Referring Provider:   Flowsheet Row Cardiac Rehab from 03/04/2016 in [facility] Cardiac and Pulmonary Rehab  Referring Provider  [name] MD      Encounter Date: 04/12/2016  Check In:     Session Check In - 04/12/16 0845      Check-In   Staff Present Heath Lark, RN, BSN, CCRP;Jessica Luan Pulling, MA, ACSM RCEP, Exercise Physiologist;Carroll Enterkin, RN, BSN   Supervising physician immediately available to respond to emergencies See telemetry face sheet for immediately available ER MD   Medication changes reported     No   Fall or balance concerns reported    No   Warm-up and Cool-down Performed on first and last piece of equipment   Resistance Training Performed Yes   VAD Patient? No     Pain Assessment   Currently in Pain? No/denies         Goals Met:  Exercise tolerated well No report of cardiac concerns or symptoms Strength training completed today  Goals Unmet:  Not Applicable  Comments: Doing well with exercise prescription progression.    Dr. Emily Filbert is Medical Director for [place] and [name].

## 2016-04-15 ENCOUNTER — Encounter: Payer: PPO | Admitting: *Deleted

## 2016-04-15 DIAGNOSIS — Z955 Presence of coronary angioplasty implant and graft: Secondary | ICD-10-CM | POA: Diagnosis not present

## 2016-04-15 DIAGNOSIS — I213 ST elevation (STEMI) myocardial infarction of unspecified site: Secondary | ICD-10-CM

## 2016-04-15 DIAGNOSIS — E538 Deficiency of other specified B group vitamins: Secondary | ICD-10-CM | POA: Diagnosis not present

## 2016-04-15 NOTE — Progress Notes (Signed)
Daily Session Note  Patient Details  Name: WINSON EICHORN MRN: [id number] Date of Birth: [birth year] Referring Provider:   Flowsheet Row Cardiac Rehab from 03/04/2016 in [facility] Cardiac and Pulmonary Rehab  Referring Provider  [name] MD      Encounter Date: 04/15/2016  Check In:     Session Check In - 04/15/16 0752      Check-In   Location [facility]-Cardiac & Pulmonary Rehab   Staff Present Gerlene Burdock, RN, Moises Blood, BS, ACSM CEP, Exercise Physiologist;Jessica Luan Pulling, [place], ACSM RCEP, Exercise Physiologist   Supervising physician immediately available to respond to emergencies See telemetry face sheet for immediately available ER MD   Medication changes reported     No   Fall or balance concerns reported    No   Warm-up and Cool-down Performed on first and last piece of equipment   Resistance Training Performed Yes   VAD Patient? No     Pain Assessment   Currently in Pain? No/denies   Multiple Pain Sites No         Goals Met:  Independence with exercise equipment Exercise tolerated well No report of cardiac concerns or symptoms Strength training completed today  Goals Unmet:  Not Applicable  Comments: Pt able to follow exercise prescription today without complaint.  Will continue to monitor for progression.    Dr. Emily Filbert is Medical Director for [place] and [name].

## 2016-04-17 ENCOUNTER — Encounter: Payer: PPO | Admitting: *Deleted

## 2016-04-17 DIAGNOSIS — I213 ST elevation (STEMI) myocardial infarction of unspecified site: Secondary | ICD-10-CM

## 2016-04-17 DIAGNOSIS — Z955 Presence of coronary angioplasty implant and graft: Secondary | ICD-10-CM

## 2016-04-17 NOTE — Progress Notes (Signed)
Daily Session Note  Patient Details  Name: Christopher Daniels MRN: [id number] Date of Birth: [birth year] Referring Provider:   Flowsheet Row Cardiac Rehab from 03/04/2016 in [facility] Cardiac and Pulmonary Rehab  Referring Provider  [name] MD      Encounter Date: 04/17/2016  Check In:     Session Check In - 04/17/16 0945      Check-In   Location [facility]-Cardiac & Pulmonary Rehab   Staff Present Alberteen Sam, MA, ACSM RCEP, Exercise Physiologist;Amanda Oletta Darter, BA, ACSM CEP, Exercise Physiologist;Carroll Enterkin, RN, BSN   Supervising physician immediately available to respond to emergencies See telemetry face sheet for immediately available ER MD   Medication changes reported     No   Fall or balance concerns reported    No   Warm-up and Cool-down Performed on first and last piece of equipment   Resistance Training Performed Yes   VAD Patient? No     Pain Assessment   Currently in Pain? No/denies   Multiple Pain Sites No           Exercise Prescription Changes - 04/16/16 1500      Exercise Review   Progression Yes     Response to Exercise   Blood Pressure (Admit) 126/60   Blood Pressure (Exercise) 140/70   Blood Pressure (Exit) 104/70   Heart Rate (Admit) 63 bpm   Heart Rate (Exercise) 117 bpm   Heart Rate (Exit) 87 bpm   Rating of Perceived Exertion (Exercise) 12   Symptoms none   Comments Home Exercise Guidelines given 04/03/16   Duration Progress to 45 minutes of aerobic exercise without signs/symptoms of physical distress   Intensity THRR unchanged     Progression   Progression Continue to progress workloads to maintain intensity without signs/symptoms of physical distress.   Average METs 4.2     Resistance Training   Training Prescription Yes   Weight 4 lbs   Reps 10-15     Interval Training   Interval Training No     Treadmill   MPH 3   Grade 1   Minutes 15   METs 3.71     NuStep   Level 6   Minutes 15   METs 4.2     Recumbant Elliptical   Level 4.5   Minutes 15   METs 4.7     Home Exercise Plan   Plans to continue exercise at Home  walking   Frequency Add 3 additional days to program exercise sessions.      Goals Met:  Independence with exercise equipment Exercise tolerated well No report of cardiac concerns or symptoms Strength training completed today  Goals Unmet:  Not Applicable  Comments: Pt able to follow exercise prescription today without complaint.  Will continue to monitor for progression.    Dr. Emily Filbert is Medical Director for [place] and [name].

## 2016-04-19 DIAGNOSIS — Z955 Presence of coronary angioplasty implant and graft: Secondary | ICD-10-CM | POA: Diagnosis not present

## 2016-04-19 DIAGNOSIS — I213 ST elevation (STEMI) myocardial infarction of unspecified site: Secondary | ICD-10-CM

## 2016-04-19 NOTE — Progress Notes (Signed)
Daily Session Note  Patient Details  Name: Christopher Daniels MRN: [id number] Date of Birth: January 23, 1935 Referring Provider:   Flowsheet Row Cardiac Rehab from 03/04/2016 in [facility] Cardiac and Pulmonary Rehab  Referring Provider  [name] MD      Encounter Date: 04/19/2016  Check In:     Session Check In - 04/19/16 0211      Check-In   Location [facility]-Cardiac & Pulmonary Rehab   Staff Present Alberteen Sam, MA, ACSM RCEP, Exercise Physiologist;Susanne Bice, RN, BSN, Lance Sell, BA, ACSM CEP, Exercise Physiologist   Supervising physician immediately available to respond to emergencies See telemetry face sheet for immediately available ER MD   Medication changes reported     No   Fall or balance concerns reported    No   Warm-up and Cool-down Performed on first and last piece of equipment   Resistance Training Performed Yes   VAD Patient? No     Pain Assessment   Currently in Pain? No/denies   Multiple Pain Sites No         Goals Met:  Independence with exercise equipment Exercise tolerated well No report of cardiac concerns or symptoms Strength training completed today  Goals Unmet:  Not Applicable  Comments: Pt able to follow exercise prescription today without complaint.  Will continue to monitor for progression.    Dr. Emily Filbert is Medical Director for [place] and [name].

## 2016-04-22 ENCOUNTER — Encounter: Payer: PPO | Admitting: *Deleted

## 2016-04-22 DIAGNOSIS — Z955 Presence of coronary angioplasty implant and graft: Secondary | ICD-10-CM

## 2016-04-22 DIAGNOSIS — I213 ST elevation (STEMI) myocardial infarction of unspecified site: Secondary | ICD-10-CM

## 2016-04-22 NOTE — Progress Notes (Signed)
Daily Session Note  Patient Details  Name: Christopher Daniels MRN: [id number] Date of Birth: 1934-04-09 Referring Provider:   Flowsheet Row Cardiac Rehab from 03/04/2016 in [facility] Cardiac and Pulmonary Rehab  Referring Provider  [name] MD      Encounter Date: 04/22/2016  Check In:     Session Check In - 04/22/16 0803      Check-In   Location [facility]-Cardiac & Pulmonary Rehab   Staff Present Alberteen Sam, MA, ACSM RCEP, Exercise Physiologist;Kelly Amedeo Plenty, BS, ACSM CEP, Exercise Physiologist;Carroll Enterkin, RN, BSN   Supervising physician immediately available to respond to emergencies See telemetry face sheet for immediately available ER MD   Medication changes reported     No   Fall or balance concerns reported    No   Warm-up and Cool-down Performed on first and last piece of equipment   Resistance Training Performed Yes   VAD Patient? No     Pain Assessment   Currently in Pain? No/denies   Multiple Pain Sites No         History  Smoking Status  . Never Smoker  Smokeless Tobacco  . Not on file    Goals Met:  Independence with exercise equipment Exercise tolerated well No report of cardiac concerns or symptoms Strength training completed today  Goals Unmet:  Not Applicable  Comments: Pt able to follow exercise prescription today without complaint.  Will continue to monitor for progression.    Dr. Emily Filbert is Medical Director for [place] and [name].

## 2016-04-24 ENCOUNTER — Encounter: Payer: PPO | Admitting: *Deleted

## 2016-04-24 ENCOUNTER — Encounter: Payer: Self-pay | Admitting: *Deleted

## 2016-04-24 DIAGNOSIS — Z955 Presence of coronary angioplasty implant and graft: Secondary | ICD-10-CM | POA: Diagnosis not present

## 2016-04-24 DIAGNOSIS — I213 ST elevation (STEMI) myocardial infarction of unspecified site: Secondary | ICD-10-CM

## 2016-04-24 NOTE — Progress Notes (Signed)
Daily Session Note  Patient Details  Name: ATARI NOVICK MRN: [id number] Date of Birth: 03-31-1934 Referring Provider:   Flowsheet Row Cardiac Rehab from 03/04/2016 in [facility] Cardiac and Pulmonary Rehab  Referring Provider  [name] MD      Encounter Date: 04/24/2016  Check In:     Session Check In - 04/24/16 0846      Check-In   Location [facility]-Cardiac & Pulmonary Rehab   Staff Present Alberteen Sam, MA, ACSM RCEP, Exercise Physiologist;Susanne Bice, RN, BSN, Lance Sell, BA, ACSM CEP, Exercise Physiologist   Supervising physician immediately available to respond to emergencies See telemetry face sheet for immediately available ER MD   Medication changes reported     No   Fall or balance concerns reported    No   Warm-up and Cool-down Performed on first and last piece of equipment   Resistance Training Performed Yes   VAD Patient? No     Pain Assessment   Currently in Pain? No/denies   Multiple Pain Sites No         History  Smoking Status  . Never Smoker  Smokeless Tobacco  . Not on file    Goals Met:  Independence with exercise equipment Exercise tolerated well No report of cardiac concerns or symptoms Strength training completed today  Goals Unmet:  Not Applicable  Comments: Pt able to follow exercise prescription today without complaint.  Will continue to monitor for progression.    Dr. Emily Filbert is Medical Director for [place] and [name].

## 2016-04-24 NOTE — Progress Notes (Signed)
Cardiac Individual Treatment Plan  Patient Details  Name: GEOFFREY HYNES MRN: [id number] Date of Birth: [birth year] Referring Provider:   Flowsheet Row Cardiac Rehab from 03/04/2016 in [facility] Cardiac and Pulmonary Rehab  Referring Provider  [name] MD      Initial Encounter Date:  Flowsheet Row Cardiac Rehab from 03/04/2016 in [facility] Cardiac and Pulmonary Rehab  Date  03/04/16  Referring Provider  [name] MD      Visit Diagnosis: ST elevation myocardial infarction (STEMI), unspecified artery [facility])  Status post coronary artery stent placement  Patient's Home Medications on Admission:  Current Outpatient Prescriptions:  .  aspirin 81 MG tablet, Take 81 mg by mouth every evening. , Disp: , Rfl:  .  atorvastatin (LIPITOR) 80 MG tablet, Take 1 tablet (80 mg total) by mouth every evening., Disp: 30 tablet, Rfl: 3 .  carboxymethylcellulose (REFRESH TEARS) 0.5 % SOLN, Place 1 drop into both eyes as needed (for dry eyes)., Disp: , Rfl:  .  carvedilol (COREG) 6.25 MG tablet, Take 1 tablet (6.25 mg total) by mouth 2 (two) times daily with a meal., Disp: 60 tablet, Rfl: 2 .  co-enzyme Q-10 30 MG capsule, Take 30 mg by mouth daily., Disp: , Rfl:  .  lisinopril (PRINIVIL,ZESTRIL) 10 MG tablet, Take 1 tablet (10 mg total) by mouth daily., Disp: 30 tablet, Rfl: 2 .  nitroGLYCERIN (NITROSTAT) 0.4 MG SL tablet, Place 1 tablet (0.4 mg total) under the tongue every 5 (five) minutes x 3 doses as needed for chest pain., Disp: 25 tablet, Rfl: 3 .  omeprazole (PRILOSEC) 20 MG capsule, Take 20 mg by mouth every evening. , Disp: , Rfl:  .  ticagrelor (BRILINTA) 90 MG TABS tablet, Take 1 tablet (90 mg total) by mouth 2 (two) times daily., Disp: 60 tablet, Rfl: 0  Past Medical History: Past Medical History:  Diagnosis Date  . Arthritis    joints  . Cancer ([place])    skin/CLL. on head years ago  . Diverticulitis   . Dysrhythmia    bradycardia  . GERD (gastroesophageal reflux disease)   . H/O Bell's palsy    . HOH (hard of hearing)   . Kidney calculi    stones from years ago  . LBBB (left bundle branch block)     Tobacco Use: History  Smoking Status  . Never Smoker  Smokeless Tobacco  . Not on file    Labs: Recent Review Flowsheet Data    Labs for ITP Cardiac and Pulmonary Rehab Latest Ref Rng & Units 01/27/2016 01/27/2016 01/27/2016 01/27/2016 01/28/2016   Cholestrol 0 - 200 mg/dL - - 138 141 -   LDLCALC 0 - 99 mg/dL - - 90 103(H) -   HDL >40 mg/dL - - 29(L) 32(L) -   Trlycerides <150 mg/dL - - 93 32 -   Hemoglobin A1c 4.8 - 5.6 % - 5.7(H) - - 5.5   TCO2 0 - 100 mmol/L 24 - - - -       Exercise Target Goals:    Exercise Program Goal: Individual exercise prescription set with THRR, safety & activity barriers. Participant demonstrates ability to understand and report RPE using BORG scale, to self-measure pulse accurately, and to acknowledge the importance of the exercise prescription.  Exercise Prescription Goal: Starting with aerobic activity 30 plus minutes a day, 3 days per week for initial exercise prescription. Provide home exercise prescription and guidelines that participant acknowledges understanding prior to discharge.  Activity Barriers & Risk Stratification:  Activity Barriers & Cardiac Risk Stratification - 03/04/16 1350      Activity Barriers & Cardiac Risk Stratification   Activity Barriers None   Cardiac Risk Stratification High      6 Minute Walk:     6 Minute Walk    Row Name 03/04/16 1508         6 Minute Walk   Phase Initial     Distance 1570 feet     Walk Time 6 minutes     # of Rest Breaks 0     MPH 2.97     METS 3.36     RPE 12     VO2 Peak 11.78     Symptoms No     Resting HR 80 bpm     Resting BP 106/56     Max Ex. HR 112 bpm     Max Ex. BP 166/60     2 Minute Post BP 126/70        Oxygen Initial Assessment:   Oxygen Re-Evaluation:   Oxygen Discharge (Final Oxygen Re-Evaluation):   Initial Exercise Prescription:      Initial Exercise Prescription - 03/04/16 1500      Date of Initial Exercise RX and Referring Provider   Date 03/04/16   Referring Provider [name] MD     Treadmill   MPH 2.7   Grade 0.5   Minutes 15   METs 3.25     NuStep   Level 2   SPM --  80-100 spm   Minutes 15   METs 2     Recumbant Elliptical   Level 1   RPM 50   Minutes 15   METs 2     Prescription Details   Frequency (times per week) 3   Duration Progress to 45 minutes of aerobic exercise without signs/symptoms of physical distress     Intensity   THRR 40-80% of Max Heartrate 104-129   Ratings of Perceived Exertion 11-15   Perceived Dyspnea 0-4     Progression   Progression Continue to progress workloads to maintain intensity without signs/symptoms of physical distress.     Resistance Training   Training Prescription Yes   Weight 3 lbs   Reps 10-15      Perform Capillary Blood Glucose checks as needed.  Exercise Prescription Changes:     Exercise Prescription Changes    Row Name 03/04/16 1300 03/19/16 1500 04/01/16 1500 04/03/16 0800 04/16/16 1500     Response to Exercise   Blood Pressure (Admit) 106/56 126/62 122/64  - 126/60   Blood Pressure (Exercise) 166/60 134/76 132/74  - 140/70   Blood Pressure (Exit) 126/70 124/72 122/68  - 104/70   Heart Rate (Admit) 80 bpm 72 bpm 72 bpm  - 63 bpm   Heart Rate (Exercise) 112 bpm 114 bpm 128 bpm  - 117 bpm   Heart Rate (Exit) 88 bpm 85 bpm 99 bpm  - 87 bpm   Oxygen Saturation (Admit) 98 %  -  -  -  -   Oxygen Saturation (Exercise) 91 %  -  -  -  -   Rating of Perceived Exertion (Exercise) _0 - 12   Symptoms none bradycardia-has not been feeling as good over weekend none none none   Comments  -  -  - Home Exercise Guidelines given 04/03/16 Home Exercise Guidelines given 04/03/16   Duration  - Progress to 45 minutes of aerobic exercise without signs/symptoms of  physical distress Progress to 45 minutes of aerobic exercise without signs/symptoms  of physical distress Progress to 45 minutes of aerobic exercise without signs/symptoms of physical distress Progress to 45 minutes of aerobic exercise without signs/symptoms of physical distress   Intensity  - THRR unchanged THRR unchanged THRR unchanged THRR unchanged     Progression   Progression  - Continue to progress workloads to maintain intensity without signs/symptoms of physical distress. Continue to progress workloads to maintain intensity without signs/symptoms of physical distress. Continue to progress workloads to maintain intensity without signs/symptoms of physical distress. Continue to progress workloads to maintain intensity without signs/symptoms of physical distress.   Average METs  - 2.62 3.34 3.34 4.2     Resistance Training   Training Prescription  - Yes Yes Yes Yes   Weight  - 3 lbs 4 lbs 4 lbs 4 lbs   Reps  - 10-15 10-15 10-15 10-15     Interval Training   Interval Training  - No No No No     Treadmill   MPH  - 2._0 Grade  - 0._1 Minutes  - _2 METs  - 3.25 3.71 3.71 3.71     NuStep   Level  - _3 Minutes  - _4 METs  - 2.8 3.5 3.5 4.2     Recumbant Elliptical   Level  - _5 4.5   RPM  - 50  -  -  -   Minutes  - _6 METs  - 1.8 2.8 2.8 4.7     Home Exercise Plan   Plans to continue exercise at  -  -  - Home  walking Home  walking   Frequency  -  -  - Add 3 additional days to program exercise sessions. Add 3 additional days to program exercise sessions.     Exercise Review   Progression -  walk test results Yes Yes Yes Yes      Exercise Comments:     Exercise Comments    Row Name 03/19/16 1521 03/19/16 1522 04/01/16 1558 04/03/16 0856 04/16/16 1525   Exercise Comments First full day of exercise!  Patient was oriented to gym and equipment including functions, settings, policies, and procedures.  Patient's individual exercise prescription and treatment plan were reviewed.  All starting workloads  were established based on the results of the 6 minute walk test done at initial orientation visit.  The plan for exercise progression was also introduced and progression will be customized based on patient's performance and goals. Dayveon is off to a good start with exercise.  He is only on day two for exercise.  He has had some chest pain and rhythm concerens that are being followed by both Dr. Einar Gip and Dr. Sabra Heck.  We will continue to monitor him closely and track his progression. Jumar has been doing well in rehab.  He is now up to level 3 on NuStep.  We will continue to monitor his progression. Reviewed home exercise with pt today.  Pt plans to walk at home for exercise.  Reviewed THR, pulse, RPE, sign and symptoms, NTG use, and when to call 911 or MD.  Also discussed weather considerations and indoor options.  Pt voiced understanding. Aniceto has been doing well in rehab.  He is now up to level 4.5 on  the recumbent elliptical and level 6 on the NuStep.  We will continue to monitor his progression.      Exercise Goals and Review:   Exercise Goals Re-Evaluation :   Discharge Exercise Prescription (Final Exercise Prescription Changes):     Exercise Prescription Changes - 04/16/16 1500      Response to Exercise   Blood Pressure (Admit) 126/60   Blood Pressure (Exercise) 140/70   Blood Pressure (Exit) 104/70   Heart Rate (Admit) 63 bpm   Heart Rate (Exercise) 117 bpm   Heart Rate (Exit) 87 bpm   Rating of Perceived Exertion (Exercise) 12   Symptoms none   Comments Home Exercise Guidelines given 04/03/16   Duration Progress to 45 minutes of aerobic exercise without signs/symptoms of physical distress   Intensity THRR unchanged     Progression   Progression Continue to progress workloads to maintain intensity without signs/symptoms of physical distress.   Average METs 4.2     Resistance Training   Training Prescription Yes   Weight 4 lbs   Reps 10-15     Interval Training   Interval  Training No     Treadmill   MPH 3   Grade 1   Minutes 15   METs 3.71     NuStep   Level 6   Minutes 15   METs 4.2     Recumbant Elliptical   Level 4.5   Minutes 15   METs 4.7     Home Exercise Plan   Plans to continue exercise at Home  walking   Frequency Add 3 additional days to program exercise sessions.     Exercise Review   Progression Yes      Nutrition:  Target Goals: Understanding of nutrition guidelines, daily intake of sodium <1572m, cholesterol <2059m calories 30% from fat and 7% or less from saturated fats, daily to have 5 or more servings of fruits and vegetables.  Biometrics:     Pre Biometrics - 03/04/16 1512      Pre Biometrics   Height _0  (1.778 m)   Weight 188 lb 4.8 oz (85.4 kg)   Waist Circumference 37 inches   Hip Circumference 41 inches   Waist to Hip Ratio 0.9 %   BMI (Calculated) 27.1   Single Leg Stand 3.28 seconds       Nutrition Therapy Plan and Nutrition Goals:     Nutrition Therapy & Goals - 04/05/16 0838      Nutrition Therapy   Diet [address] appointment      Nutrition Discharge: Rate Your Plate Scores:     Nutrition Assessments - 03/04/16 1347      MEDFICTS Scores   Pre Score 39      Nutrition Goals Re-Evaluation:   Nutrition Goals Discharge (Final Nutrition Goals Re-Evaluation):   Psychosocial: Target Goals: Acknowledge presence or absence of significant depression and/or stress, maximize coping skills, provide positive support system. Participant is able to verbalize types and ability to use techniques and skills needed for reducing stress and depression.   Initial Review & Psychosocial Screening:     Initial Psych Review & Screening - 03/04/16 1348      Initial Review   Current issues with --  None reported     Family Dynamics   Good Support System? Yes  Wife, church, family     Barriers   Psychosocial barriers to participate in program There are no identifiable barriers or  psychosocial needs.;The patient should benefit from training  in stress management and relaxation.     Screening Interventions   Interventions Encouraged to exercise      Quality of Life Scores:      Quality of Life - 03/04/16 1349      Quality of Life Scores   Health/Function Pre 25.29 %   Socioeconomic Pre 27.75 %   Psych/Spiritual Pre 28.29 %   Family Pre 27.6 %   GLOBAL Pre 26.77 %      PHQ-9: Recent Review Flowsheet Data    Depression screen [facility] 2/9 03/04/2016 01/30/2016   Decreased Interest 0 0   Down, Depressed, Hopeless 0 0   PHQ - 2 Score 0 0   Altered sleeping 0 -   Tired, decreased energy 0 -   Change in appetite 0 -   Feeling bad or failure about yourself  0 -   Trouble concentrating 0 -   Moving slowly or fidgety/restless 0 -   Suicidal thoughts 0 -   PHQ-9 Score 0 -   Difficult doing work/chores Not difficult at all -     Interpretation of Total Score  Total Score Depression Severity:  1-4 = Minimal depression, 5-9 = Mild depression, 10-14 = Moderate depression, 15-19 = Moderately severe depression, 20-27 = Severe depression   Psychosocial Evaluation and Intervention:     Psychosocial Evaluation - 04/15/16 0844      Psychosocial Evaluation & Interventions   Comments Counselor met with Mr. Percival who is a 82 year old who had a heart attack in December resulting in insertion of (2) stents.  He has a strong support system with a spouse of 20 years and a daughter who lives close by.  He reports sleeping well and having a good appetite.  Mr. Mccalla denies a history of depression or anxiety or current symptoms.  He states he is typically in a positive mood; although his spouse has multiple health issues and he has been her primary caregiver the past 2 years.  His goals for this program are to get his heart stronger and increase his overall stamina.  Staff will follow with Mr. Kon throughout the remainder of this program.        Psychosocial  Re-Evaluation:   Psychosocial Discharge (Final Psychosocial Re-Evaluation):   Vocational Rehabilitation: Provide vocational rehab assistance to qualifying candidates.   Vocational Rehab Evaluation & Intervention:     Vocational Rehab - 03/04/16 1351      Initial Vocational Rehab Evaluation & Intervention   Assessment shows need for Vocational Rehabilitation No      Education: Education Goals: Education classes will be provided on a weekly basis, covering required topics. Participant will state understanding/return demonstration of topics presented.  Learning Barriers/Preferences:     Learning Barriers/Preferences - 03/04/16 1350      Learning Barriers/Preferences   Learning Barriers None   Learning Preferences None      Education Topics: General Nutrition Guidelines/Fats and Fiber: -Group instruction provided by verbal, written material, models and posters to present the general guidelines for heart healthy nutrition. Gives an explanation and review of dietary fats and fiber.   Controlling Sodium/Reading Food Labels: -Group verbal and written material supporting the discussion of sodium use in heart healthy nutrition. Review and explanation with models, verbal and written materials for utilization of the food label.   Exercise Physiology & Risk Factors: - Group verbal and written instruction with models to review the exercise physiology of the cardiovascular system and associated critical values. Details cardiovascular disease risk  factors and the goals associated with each risk factor. Flowsheet Row Cardiac Rehab from 04/22/2016 in [facility] Cardiac and Pulmonary Rehab  Date  03/18/16  Educator  [facility]  Instruction Review Code  2- meets goals/outcomes      Aerobic Exercise & Resistance Training: - Gives group verbal and written discussion on the health impact of inactivity. On the components of aerobic and resistive training programs and the benefits of this training and  how to safely progress through these programs. Flowsheet Row Cardiac Rehab from 04/22/2016 in [facility] Cardiac and Pulmonary Rehab  Date  03/20/16  Educator  [facility]  Instruction Review Code  2- meets goals/outcomes      Flexibility, Balance, General Exercise Guidelines: - Provides group verbal and written instruction on the benefits of flexibility and balance training programs. Provides general exercise guidelines with specific guidelines to those with heart or lung disease. Demonstration and skill practice provided. Flowsheet Row Cardiac Rehab from 04/22/2016 in [facility] Cardiac and Pulmonary Rehab  Date  03/25/16  Educator  [facility]  Instruction Review Code  2- meets goals/outcomes      Stress Management: - Provides group verbal and written instruction about the health risks of elevated stress, cause of high stress, and healthy ways to reduce stress. Flowsheet Row Cardiac Rehab from 04/22/2016 in [facility] Cardiac and Pulmonary Rehab  Date  04/03/16  Educator  [facility]  Instruction Review Code  2- meets goals/outcomes      Depression: - Provides group verbal and written instruction on the correlation between heart/lung disease and depressed mood, treatment options, and the stigmas associated with seeking treatment.   Anatomy & Physiology of the Heart: - Group verbal and written instruction and models provide basic cardiac anatomy and physiology, with the coronary electrical and arterial systems. Review of: AMI, Angina, Valve disease, Heart Failure, Cardiac Arrhythmia, Pacemakers, and the ICD. Flowsheet Row Cardiac Rehab from 04/22/2016 in [facility] Cardiac and Pulmonary Rehab  Date  04/01/16  Educator  MA  Instruction Review Code  2- meets goals/outcomes      Cardiac Procedures: - Group verbal and written instruction and models to describe the testing methods done to diagnose heart disease. Reviews the outcomes of the test results. Describes the treatment choices: Medical Management, Angioplasty, or Coronary  Bypass Surgery. Flowsheet Row Cardiac Rehab from 04/22/2016 in [facility] Cardiac and Pulmonary Rehab  Date  04/08/16  Educator  CE  Instruction Review Code  2- meets goals/outcomes      Cardiac Medications: - Group verbal and written instruction to review commonly prescribed medications for heart disease. Reviews the medication, class of the drug, and side effects. Includes the steps to properly store meds and maintain the prescription regimen. Flowsheet Row Cardiac Rehab from 04/22/2016 in [facility] Cardiac and Pulmonary Rehab  Date  04/15/16 [04/17/16 Second Class]  Educator  CE  Instruction Review Code  2- meets goals/outcomes      Go Sex-Intimacy & Heart Disease, Get SMART - Goal Setting: - Group verbal and written instruction through game format to discuss heart disease and the return to sexual intimacy. Provides group verbal and written material to discuss and apply goal setting through the application of the S.M.A.R.T. Method. Flowsheet Row Cardiac Rehab from 04/22/2016 in [facility] Cardiac and Pulmonary Rehab  Date  04/08/16  Educator  CE  Instruction Review Code  2- meets goals/outcomes      Other Matters of the Heart: - Provides group verbal, written materials and models to describe Heart Failure, Angina, Valve Disease, and Diabetes  in the realm of heart disease. Includes description of the disease process and treatment options available to the cardiac patient. Flowsheet Row Cardiac Rehab from 04/22/2016 in [facility] Cardiac and Pulmonary Rehab  Date  04/01/16  Educator  MA  Instruction Review Code  2- meets goals/outcomes      Exercise & Equipment Safety: - Individual verbal instruction and demonstration of equipment use and safety with use of the equipment. Flowsheet Row Cardiac Rehab from 04/22/2016 in [facility] Cardiac and Pulmonary Rehab  Date  03/04/16  Educator  Sb  Instruction Review Code  2- meets goals/outcomes      Infection Prevention: - Provides verbal and written material to  individual with discussion of infection control including proper hand washing and proper equipment cleaning during exercise session. Flowsheet Row Cardiac Rehab from 04/22/2016 in [facility] Cardiac and Pulmonary Rehab  Date  03/04/16  Educator  Sb  Instruction Review Code  2- meets goals/outcomes      Falls Prevention: - Provides verbal and written material to individual with discussion of falls prevention and safety. Flowsheet Row Cardiac Rehab from 04/22/2016 in [facility] Cardiac and Pulmonary Rehab  Date  03/04/16  Educator  SB  Instruction Review Code  2- meets goals/outcomes      Diabetes: - Individual verbal and written instruction to review signs/symptoms of diabetes, desired ranges of glucose level fasting, after meals and with exercise. Advice that pre and post exercise glucose checks will be done for 3 sessions at entry of program.    Knowledge Questionnaire Score:     Knowledge Questionnaire Score - 03/04/16 1351      Knowledge Questionnaire Score   Pre Score 23/28  Reviewed correct responses with Eulas Post today      Core Components/Risk Factors/Patient Goals at Admission:     Personal Goals and Risk Factors at Admission - 03/04/16 1347      Core Components/Risk Factors/Patient Goals on Admission    Weight Management Yes;Weight Loss   Intervention Weight Management: Develop a combined nutrition and exercise program designed to reach desired caloric intake, while maintaining appropriate intake of nutrient and fiber, sodium and fats, and appropriate energy expenditure required for the weight goal.;Weight Management: Provide education and appropriate resources to help participant work on and attain dietary goals.   Admit Weight 188 lb 4.8 oz (85.4 kg)   Goal Weight: Short Term 185 lb (83.9 kg)   Goal Weight: Long Term 180 lb (81.6 kg)   Expected Outcomes Long Term: Adherence to nutrition and physical activity/exercise program aimed toward attainment of established weight  goal;Short Term: Continue to assess and modify interventions until short term weight is achieved;Weight Loss: Understanding of general recommendations for a balanced deficit meal plan, which promotes 1-2 lb weight loss per week and includes a negative energy balance of [phone] kcal/d;Understanding recommendations for meals to include 15-35% energy as protein, 25-35% energy from fat, 35-60% energy from carbohydrates, less than 227m of dietary cholesterol, 20-35 gm of total fiber daily;Understanding of distribution of calorie intake throughout the day with the consumption of 4-5 meals/snacks   Sedentary Yes   Intervention Develop an individualized exercise prescription for aerobic and resistive training based on initial evaluation findings, risk stratification, comorbidities and participant's personal goals.;Provide advice, education, support and counseling about physical activity/exercise needs.   Expected Outcomes Achievement of increased cardiorespiratory fitness and enhanced flexibility, muscular endurance and strength shown through measurements of functional capacity and personal statement of participant.   Increase Strength and Stamina Yes   Intervention Provide  advice, education, support and counseling about physical activity/exercise needs.;Develop an individualized exercise prescription for aerobic and resistive training based on initial evaluation findings, risk stratification, comorbidities and participant's personal goals.   Expected Outcomes Achievement of increased cardiorespiratory fitness and enhanced flexibility, muscular endurance and strength shown through measurements of functional capacity and personal statement of participant.   Hypertension Yes   Intervention Provide education on lifestyle modifcations including regular physical activity/exercise, weight management, moderate sodium restriction and increased consumption of fresh fruit, vegetables, and low fat dairy, alcohol moderation,  and smoking cessation.;Monitor prescription use compliance.   Expected Outcomes Short Term: Continued assessment and intervention until BP is < 140/3m HG in hypertensive participants. < 130/877mHG in hypertensive participants with diabetes, heart failure or chronic kidney disease.;Long Term: Maintenance of blood pressure at goal levels.   Lipids Yes   Intervention Provide education and support for participant on nutrition & aerobic/resistive exercise along with prescribed medications to achieve LDL <7032mHDL >69m24m Expected Outcomes Short Term: Participant states understanding of desired cholesterol values and is compliant with medications prescribed. Participant is following exercise prescription and nutrition guidelines.;Long Term: Cholesterol controlled with medications as prescribed, with individualized exercise RX and with personalized nutrition plan. Value goals: LDL < 70mg53mL > 40 mg.      Core Components/Risk Factors/Patient Goals Review:      Goals and Risk Factor Review    Row Name 04/05/16 0847 04/05/16 0923           Core Components/Risk Factors/Patient Goals Review   Personal Goals Review Weight Management/Obesity;Hypertension;Lipids  -      Review Ed states he has changed his eating habits. More fruit per day several servings each day.No bacon sinced heart attack. Has decreased snacks, completely stopped eating ice cream.BP has improved since med chages. Cholesterol med remains compliant and will talk to his physician about lowering dose, since he is experiencing sme muscle asches.  Weight at 186 lbs today  almost at short term goal.  Weighed 204 Lbs at admission to hospital      Expected Outcomes Continued work on goals with adherence to medicatin compliance and nutrition and exercise habits  -         Core Components/Risk Factors/Patient Goals at Discharge (Final Review):      Goals and Risk Factor Review - 04/05/16 0923      Core Components/Risk Factors/Patient  Goals Review   Review Weight at 186 lbs today  almost at short term goal.  Weighed 204 Lbs at admission to hospital      ITP Comments:     ITP Comments    Row Name 03/04/16 1338 03/08/16 0818 03/11/16 0847 03/18/16 1007 03/20/16 0822   ITP Comments Med review completed. Initial ITP created.  Documentation of diagnosis can be found in [facility] and 02/13/2016 admission Appointment made to meet with RD on 03/11/2016 GlennMaxi to rehab this morning and was complaining of chest pain/pressure over the weekend.  He said that it was left sided and different from what he was having previously.  He was sent to see his cardiologist for evaluation. I took the heart rate rhythm strips of 44 for Dr. Mark Emily Filbertee. We also did an EKG which he saw. I told Dr. MilleLynnae Prude their are some [facility]. MilleSabra Heck GlennKarsyntop his Coreg (CArvidolol). Dr. MilleSabra Heck ask his staff to call and make GlennEldorppt to see Dr.  Miller in a few days. Life Vest is on. Eulas Post sess Dr Sabra Heck tomorrow - he has decreased Coreg by 1/2 to wean off per prescription instruction.   [place] Name 03/27/16 0603 04/08/16 0956 04/24/16 0549       ITP Comments 30 day review. Continue with ITP unless directed changes per Medical Director review. Continues to have low blood pressure readings. No symptoms reported.  Josuha will notify his physician of BP readings.  30 day review. Continue with ITP unless directed changes per Medical Director review        Comments:

## 2016-04-26 ENCOUNTER — Encounter: Payer: PPO | Attending: Cardiology | Admitting: *Deleted

## 2016-04-26 DIAGNOSIS — Z955 Presence of coronary angioplasty implant and graft: Secondary | ICD-10-CM | POA: Insufficient documentation

## 2016-04-26 DIAGNOSIS — I213 ST elevation (STEMI) myocardial infarction of unspecified site: Secondary | ICD-10-CM

## 2016-04-26 NOTE — Progress Notes (Signed)
Daily Session Note  Patient Details  Name: [name] MRN: [id number] Date of Birth: 06-07-1934 Referring Provider:   Flowsheet Row Cardiac Rehab from 03/04/2016 in [facility] Cardiac and Pulmonary Rehab  Referring Provider  [name] MD      Encounter Date: 04/26/2016  Check In:     Session Check In - 04/26/16 0935      Check-In   Location [facility]-Cardiac & Pulmonary Rehab   Staff Present Alberteen Sam, MA, ACSM RCEP, Exercise Physiologist;Susanne Bice, RN, BSN, CCRP   Supervising physician immediately available to respond to emergencies See telemetry face sheet for immediately available ER MD   Medication changes reported     No   Fall or balance concerns reported    No   Warm-up and Cool-down Performed on first and last piece of equipment   Resistance Training Performed Yes   VAD Patient? No     Pain Assessment   Currently in Pain? No/denies   Multiple Pain Sites No         History  Smoking Status  . Never Smoker  Smokeless Tobacco  . Not on file    Goals Met:  Independence with exercise equipment Exercise tolerated well No report of cardiac concerns or symptoms Strength training completed today  Goals Unmet:  Not Applicable  Comments: Pt able to follow exercise prescription today without complaint.  Will continue to monitor for progression.    Dr. Emily Filbert is Medical Director for [place] and [name].

## 2016-04-29 ENCOUNTER — Encounter: Payer: PPO | Admitting: *Deleted

## 2016-04-29 VITALS — Ht 70.0 in | Wt 188.0 lb

## 2016-04-29 DIAGNOSIS — Z955 Presence of coronary angioplasty implant and graft: Secondary | ICD-10-CM | POA: Diagnosis not present

## 2016-04-29 DIAGNOSIS — I213 ST elevation (STEMI) myocardial infarction of unspecified site: Secondary | ICD-10-CM

## 2016-04-29 NOTE — Progress Notes (Signed)
Daily Session Note  Patient Details  Name: Christopher Daniels MRN: [id number] Date of Birth: 09-29-1934 Referring Provider:   Flowsheet Row Cardiac Rehab from 03/04/2016 in [facility] Cardiac and Pulmonary Rehab  Referring Provider  [name] MD      Encounter Date: 04/29/2016  Check In:     Session Check In - 04/29/16 0907      Check-In   Location [facility]-Cardiac & Pulmonary Rehab   Staff Present Alberteen Sam, MA, ACSM RCEP, Exercise Physiologist;Kelly Amedeo Plenty, BS, ACSM CEP, Exercise Physiologist;Other  Darel Hong, RN   Supervising physician immediately available to respond to emergencies See telemetry face sheet for immediately available ER MD   Medication changes reported     No   Fall or balance concerns reported    No   Warm-up and Cool-down Performed on first and last piece of equipment   Resistance Training Performed Yes   VAD Patient? No     Pain Assessment   Currently in Pain? No/denies   Multiple Pain Sites No         History  Smoking Status  . Never Smoker  Smokeless Tobacco  . Not on file    Goals Met:  Exercise tolerated well No report of cardiac concerns or symptoms Strength training completed today  Goals Unmet:  Not Applicable  Comments: Pt able to follow exercise prescription today without complaint.  Will continue to monitor for progression.     [place] Name 03/04/16 1508 04/29/16 0908       6 Minute Walk   Phase Initial Discharge    Distance 1570 feet 1784 feet    Distance % Change  - 12 %  214 ft    Walk Time 6 minutes 6 minutes    # of Rest Breaks 0 0    MPH 2.97 3.38    METS 3.36 3.44    RPE 12 13    VO2 Peak 11.78 12.03    Symptoms No No    Resting HR 80 bpm 81 bpm    Resting BP 106/56 110/62    Max Ex. HR 112 bpm 110 bpm    Max Ex. BP 166/60 132/64    2 Minute Post BP 126/70  -         Dr. Emily Filbert is Medical Director for [place] and [name].

## 2016-05-01 DIAGNOSIS — I213 ST elevation (STEMI) myocardial infarction of unspecified site: Secondary | ICD-10-CM

## 2016-05-01 DIAGNOSIS — Z955 Presence of coronary angioplasty implant and graft: Secondary | ICD-10-CM

## 2016-05-01 NOTE — Progress Notes (Signed)
Daily Session Note  Patient Details  Name: Christopher Daniels MRN: [id number] Date of Birth: [birth year] Referring Provider:   Flowsheet Row Cardiac Rehab from 03/04/2016 in [facility] Cardiac and Pulmonary Rehab  Referring Provider  [name] MD      Encounter Date: 05/01/2016  Check In:     Session Check In - 05/01/16 0845      Check-In   Location [facility]-Cardiac & Pulmonary Rehab   Staff Present Alberteen Sam, MA, ACSM RCEP, Exercise Physiologist;Susanne Bice, RN, BSN, Lance Sell, BA, ACSM CEP, Exercise Physiologist   Supervising physician immediately available to respond to emergencies See telemetry face sheet for immediately available ER MD   Medication changes reported     No   Fall or balance concerns reported    No   Warm-up and Cool-down Performed on first and last piece of equipment   Resistance Training Performed Yes   VAD Patient? No     Pain Assessment   Currently in Pain? No/denies           Exercise Prescription Changes - 04/30/16 1600      Response to Exercise   Blood Pressure (Admit) 110/62   Blood Pressure (Exercise) 142/75   Blood Pressure (Exit) 112/60   Heart Rate (Admit) 83 bpm   Heart Rate (Exercise) 127 bpm   Heart Rate (Exit) 67 bpm   Rating of Perceived Exertion (Exercise) 13   Symptoms none   Duration Continue with 45 min of aerobic exercise without signs/symptoms of physical distress.   Intensity THRR unchanged     Progression   Progression Continue to progress workloads to maintain intensity without signs/symptoms of physical distress.   Average METs 3.72     Resistance Training   Training Prescription Yes   Weight 4 lbs   Reps 10-15     Interval Training   Interval Training No     Treadmill   MPH 3.2   Grade 1   Minutes 15   METs 3.45     NuStep   Level 6   Minutes 15   METs 3.6     Recumbant Elliptical   Level 4.5   Minutes 15   METs 4.1     Home Exercise Plan   Plans to continue exercise at --  walking   Frequency Add 3 additional days to program exercise sessions.   Initial Home Exercises Provided 04/03/16      History  Smoking Status  . Never Smoker  Smokeless Tobacco  . Not on file    Goals Met:  Independence with exercise equipment Exercise tolerated well No report of cardiac concerns or symptoms Strength training completed today  Goals Unmet:  Not Applicable  Comments: Pt able to follow exercise prescription today without complaint.  Will continue to monitor for progression.    Dr. Emily Filbert is Medical Director for [place] and [name].

## 2016-05-02 DIAGNOSIS — I213 ST elevation (STEMI) myocardial infarction of unspecified site: Secondary | ICD-10-CM | POA: Diagnosis not present

## 2016-05-03 ENCOUNTER — Encounter: Payer: PPO | Admitting: *Deleted

## 2016-05-03 DIAGNOSIS — Z955 Presence of coronary angioplasty implant and graft: Secondary | ICD-10-CM

## 2016-05-03 DIAGNOSIS — I213 ST elevation (STEMI) myocardial infarction of unspecified site: Secondary | ICD-10-CM

## 2016-05-03 NOTE — Progress Notes (Signed)
Daily Session Note  Patient Details  Name: [name] MRN: [id number] Date of Birth: Aug 19, 1934 Referring Provider:   Flowsheet Row Cardiac Rehab from 03/04/2016 in [facility] Cardiac and Pulmonary Rehab  Referring Provider  [name] MD      Encounter Date: 05/03/2016  Check In:     Session Check In - 05/03/16 0807      Check-In   Staff Present Heath Lark, RN, BSN, CCRP;Patricia Surles RN BSN;Jessica Luan Pulling, MA, ACSM RCEP, Exercise Physiologist   Supervising physician immediately available to respond to emergencies See telemetry face sheet for immediately available ER MD   Medication changes reported     No   Fall or balance concerns reported    No   Warm-up and Cool-down Performed on first and last piece of equipment   Resistance Training Performed Yes   VAD Patient? No     Pain Assessment   Currently in Pain? No/denies         History  Smoking Status  . Never Smoker  Smokeless Tobacco  . Not on file    Goals Met:  Exercise tolerated well Personal goals reviewed No report of cardiac concerns or symptoms  Goals Unmet:  Not Applicable  Comments: Doing well with exercise prescription progression.    Dr. Emily Filbert is Medical Director for [place] and [name].

## 2016-05-03 NOTE — Patient Instructions (Signed)
Discharge Instructions  Patient Details  Name: Christopher Daniels MRN: [id number] Date of Birth: [birth year] Referring Provider:  [name], MD   Number of Visits: 36/36  Reason for Discharge:  Patient reached a stable level of exercise. Patient independent in their exercise.  Smoking History:  History  Smoking Status  . Never Smoker  Smokeless Tobacco  . Not on file    Diagnosis:  ST elevation myocardial infarction (STEMI), unspecified artery ([place])  Status post coronary artery stent placement  Initial Exercise Prescription:     Initial Exercise Prescription - 03/04/16 1500      Date of Initial Exercise RX and Referring Provider   Date 03/04/16   Referring Provider [name] MD     Treadmill   MPH 2.7   Grade 0.5   Minutes 15   METs 3.25     NuStep   Level 2   SPM --  80-100 spm   Minutes 15   METs 2     Recumbant Elliptical   Level 1   RPM 50   Minutes 15   METs 2     Prescription Details   Frequency (times per week) 3   Duration Progress to 45 minutes of aerobic exercise without signs/symptoms of physical distress     Intensity   THRR 40-80% of Max Heartrate 104-129   Ratings of Perceived Exertion 11-15   Perceived Dyspnea 0-4     Progression   Progression Continue to progress workloads to maintain intensity without signs/symptoms of physical distress.     Resistance Training   Training Prescription Yes   Weight 3 lbs   Reps 10-15      Discharge Exercise Prescription (Final Exercise Prescription Changes):     Exercise Prescription Changes - 04/30/16 1600      Response to Exercise   Blood Pressure (Admit) 110/62   Blood Pressure (Exercise) 142/75   Blood Pressure (Exit) 112/60   Heart Rate (Admit) 83 bpm   Heart Rate (Exercise) 127 bpm   Heart Rate (Exit) 67 bpm   Rating of Perceived Exertion (Exercise) 13   Symptoms none   Duration Continue with 45 min of aerobic exercise without signs/symptoms of physical distress.   Intensity  THRR unchanged     Progression   Progression Continue to progress workloads to maintain intensity without signs/symptoms of physical distress.   Average METs 3.72     Resistance Training   Training Prescription Yes   Weight 4 lbs   Reps 10-15     Interval Training   Interval Training No     Treadmill   MPH 3.2   Grade 1   Minutes 15   METs 3.45     NuStep   Level 6   Minutes 15   METs 3.6     Recumbant Elliptical   Level 4.5   Minutes 15   METs 4.1     Home Exercise Plan   Plans to continue exercise at --  walking   Frequency Add 3 additional days to program exercise sessions.   Initial Home Exercises Provided 04/03/16      Functional Capacity:     6 Minute Walk    [place] Name 03/04/16 1508 04/29/16 0908       6 Minute Walk   Phase Initial Discharge    Distance 1570 feet 1784 feet    Distance % Change  - 12 %  214 ft    Walk Time 6 minutes 6 minutes    #  of Rest Breaks 0 0    MPH 2.97 3.38    METS 3.36 3.44    RPE 12 13    VO2 Peak 11.78 12.03    Symptoms No No    Resting HR 80 bpm 81 bpm    Resting BP 106/56 110/62    Max Ex. HR 112 bpm 110 bpm    Max Ex. BP 166/60 132/64    2 Minute Post BP 126/70  -       Quality of Life:     Quality of Life - 04/29/16 0910      Quality of Life Scores   Health/Function Pre 25.29 %   Health/Function Post 22.8 %   Health/Function % Change -9.85 %   Socioeconomic Pre 27.75 %   Socioeconomic Post 25.71 %   Socioeconomic % Change  -7.35 %   Psych/Spiritual Pre 28.29 %   Psych/Spiritual Post 27.36 %   Psych/Spiritual % Change -3.29 %   Family Pre 27.6 %   Family Post 23 %   Family % Change -16.67 %   GLOBAL Pre 26.77 %   GLOBAL Post 24.37 %   GLOBAL % Change -8.97 %      Personal Goals: Goals established at orientation with interventions provided to work toward goal.     Personal Goals and Risk Factors at Admission - 03/04/16 1347      Core Components/Risk Factors/Patient Goals on Admission     Weight Management Yes;Weight Loss   Intervention Weight Management: Develop a combined nutrition and exercise program designed to reach desired caloric intake, while maintaining appropriate intake of nutrient and fiber, sodium and fats, and appropriate energy expenditure required for the weight goal.;Weight Management: Provide education and appropriate resources to help participant work on and attain dietary goals.   Admit Weight 188 lb 4.8 oz (85.4 kg)   Goal Weight: Short Term 185 lb (83.9 kg)   Goal Weight: Long Term 180 lb (81.6 kg)   Expected Outcomes Long Term: Adherence to nutrition and physical activity/exercise program aimed toward attainment of established weight goal;Short Term: Continue to assess and modify interventions until short term weight is achieved;Weight Loss: Understanding of general recommendations for a balanced deficit meal plan, which promotes 1-2 lb weight loss per week and includes a negative energy balance of [phone] kcal/d;Understanding recommendations for meals to include 15-35% energy as protein, 25-35% energy from fat, 35-60% energy from carbohydrates, less than 200mg  of dietary cholesterol, 20-35 gm of total fiber daily;Understanding of distribution of calorie intake throughout the day with the consumption of 4-5 meals/snacks   Sedentary Yes   Intervention Develop an individualized exercise prescription for aerobic and resistive training based on initial evaluation findings, risk stratification, comorbidities and participant's personal goals.;Provide advice, education, support and counseling about physical activity/exercise needs.   Expected Outcomes Achievement of increased cardiorespiratory fitness and enhanced flexibility, muscular endurance and strength shown through measurements of functional capacity and personal statement of participant.   Increase Strength and Stamina Yes   Intervention Provide advice, education, support and counseling about physical  activity/exercise needs.;Develop an individualized exercise prescription for aerobic and resistive training based on initial evaluation findings, risk stratification, comorbidities and participant's personal goals.   Expected Outcomes Achievement of increased cardiorespiratory fitness and enhanced flexibility, muscular endurance and strength shown through measurements of functional capacity and personal statement of participant.   Hypertension Yes   Intervention Provide education on lifestyle modifcations including regular physical activity/exercise, weight management, moderate sodium restriction and increased consumption  of fresh fruit, vegetables, and low fat dairy, alcohol moderation, and smoking cessation.;Monitor prescription use compliance.   Expected Outcomes Short Term: Continued assessment and intervention until BP is < 140/33mm HG in hypertensive participants. < 130/23mm HG in hypertensive participants with diabetes, heart failure or chronic kidney disease.;Long Term: Maintenance of blood pressure at goal levels.   Lipids Yes   Intervention Provide education and support for participant on nutrition & aerobic/resistive exercise along with prescribed medications to achieve LDL 70mg , HDL >40mg .   Expected Outcomes Short Term: Participant states understanding of desired cholesterol values and is compliant with medications prescribed. Participant is following exercise prescription and nutrition guidelines.;Long Term: Cholesterol controlled with medications as prescribed, with individualized exercise RX and with personalized nutrition plan. Value goals: LDL < 70mg , HDL > 40 mg.       Personal Goals Discharge:     Goals and Risk Factor Review - 05/03/16 0758      Core Components/Risk Factors/Patient Goals Review   Personal Goals Review Weight Management/Obesity;Hypertension;Lipids   Review Weight is up some today, has lost 3 inches. Is eating heart healthy .  Had dumplings this week.  Weight up  since.  Has decreased eating treats from daily to once in awhile. Has exercise equipment at home he is using. 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 Has lost 14 pounds since hospital stay.   Expected Outcomes Continued work on goals with adherence to medication compliance and nutrition and exercise habits.      Nutrition & Weight - Outcomes:     Pre Biometrics - 03/04/16 1512      Pre Biometrics   Height 5\' 10"  (1.778 m)   Weight 188 lb 4.8 oz (85.4 kg)   Waist Circumference 37 inches   Hip Circumference 41 inches   Waist to Hip Ratio 0.9 %   BMI (Calculated) 27.1   Single Leg Stand 3.28 seconds         Post Biometrics - 04/29/16 0909       Post  Biometrics   Height 5\' 10"  (1.778 m)   Weight 188 lb (85.3 kg)   Waist Circumference 34 inches   Hip Circumference 38.5 inches   Waist to Hip Ratio 0.88 %   BMI (Calculated) 27   Single Leg Stand 9.18 seconds      Nutrition:     Nutrition Therapy & Goals - 04/05/16 0838      Nutrition Therapy   Diet [address] appointment      Nutrition Discharge:     Nutrition Assessments - 04/26/16 0934      MEDFICTS Scores   Pre Score 39   Post Score 33   Score Difference -6      Education Questionnaire Score:     Knowledge Questionnaire Score - 04/29/16 0910      Knowledge Questionnaire Score   Pre Score 23/28   Post Score 25/28      Goals reviewed with patient; copy given to patient.

## 2016-05-06 ENCOUNTER — Encounter: Payer: PPO | Admitting: *Deleted

## 2016-05-06 DIAGNOSIS — I213 ST elevation (STEMI) myocardial infarction of unspecified site: Secondary | ICD-10-CM

## 2016-05-06 DIAGNOSIS — Z955 Presence of coronary angioplasty implant and graft: Secondary | ICD-10-CM

## 2016-05-06 NOTE — Progress Notes (Signed)
Discharge Summary  Patient Details  Name: Christopher Daniels MRN: [id number] Date of Birth: 1934-05-02 Referring Provider:   Flowsheet Row Cardiac Rehab from 03/04/2016 in [facility]  Referring Provider  [name] MD       Number of Visits: 36/36  Reason for Discharge:  Patient reached a stable level of exercise. Patient independent in their exercise.  Smoking History:  History  Smoking Status  . Never Smoker  Smokeless Tobacco  . Not on file    Diagnosis:  ST elevation myocardial infarction (STEMI), unspecified artery ([place])  Status post coronary artery stent placement  ADL [facility]:   Initial Exercise Prescription:     Initial Exercise Prescription - 03/04/16 1500      Date of Initial Exercise RX and Referring Provider   Date 03/04/16   Referring Provider [name] MD     Treadmill   MPH 2.7   Grade 0.5   Minutes 15   METs 3.25     NuStep   Level 2   SPM --  80-100 spm   Minutes 15   METs 2     Recumbant Elliptical   Level 1   RPM 50   Minutes 15   METs 2     Prescription Details   Frequency (times per week) 3   Duration Progress to 45 minutes of aerobic exercise without signs/symptoms of physical distress     Intensity   THRR 40-80% of Max Heartrate 104-129   Ratings of Perceived Exertion 11-15   Perceived Dyspnea 0-4     Progression   Progression Continue to progress workloads to maintain intensity without signs/symptoms of physical distress.     Resistance Training   Training Prescription Yes   Weight 3 lbs   Reps 10-15      Discharge Exercise Prescription (Final Exercise Prescription Changes):     Exercise Prescription Changes - 04/30/16 1600      Response to Exercise   Blood Pressure (Admit) 110/62   Blood Pressure (Exercise) 142/75   Blood Pressure (Exit) 112/60   Heart Rate (Admit) 83 bpm   Heart Rate (Exercise) 127 bpm   Heart Rate (Exit) 67 bpm   Rating of Perceived Exertion (Exercise) 13   Symptoms  none   Duration Continue with 45 min of aerobic exercise without signs/symptoms of physical distress.   Intensity THRR unchanged     Progression   Progression Continue to progress workloads to maintain intensity without signs/symptoms of physical distress.   Average METs 3.72     Resistance Training   Training Prescription Yes   Weight 4 lbs   Reps 10-15     Interval Training   Interval Training No     Treadmill   MPH 3.2   Grade 1   Minutes 15   METs 3.45     NuStep   Level 6   Minutes 15   METs 3.6     Recumbant Elliptical   Level 4.5   Minutes 15   METs 4.1     Home Exercise Plan   Plans to continue exercise at --  walking   Frequency Add 3 additional days to program exercise sessions.   Initial Home Exercises Provided 04/03/16      Functional Capacity:     6 Minute Walk    Row Name 03/04/16 1508 04/29/16 0908       6 Minute Walk   Phase Initial Discharge    Distance 1570 feet  1784 feet    Distance % Change  - 12 %  214 ft    Walk Time 6 minutes 6 minutes    # of Rest Breaks 0 0    MPH 2.97 3.38    METS 3.36 3.44    RPE 12 13    VO2 Peak 11.78 12.03    Symptoms No No    Resting HR 80 bpm 81 bpm    Resting BP 106/56 110/62    Max Ex. HR 112 bpm 110 bpm    Max Ex. BP 166/60 132/64    2 Minute Post BP 126/70  -       Psychological, QOL, Others - Outcomes: PHQ 2/9: Depression screen [facility] 2/9 04/29/2016 03/04/2016 01/30/2016  Decreased Interest 0 0 0  Down, Depressed, Hopeless 0 0 0  PHQ - 2 Score 0 0 0  Altered sleeping 0 0 -  Tired, decreased energy 1 0 -  Change in appetite 0 0 -  Feeling bad or failure about yourself  0 0 -  Trouble concentrating 0 0 -  Moving slowly or fidgety/restless 0 0 -  Suicidal thoughts 0 0 -  PHQ-9 Score 1 0 -  Difficult doing work/chores Not difficult at all Not difficult at all -    Quality of Life:     Quality of Life - 04/29/16 0910      Quality of Life Scores   Health/Function Pre 25.29 %    Health/Function Post 22.8 %   Health/Function % Change -9.85 %   Socioeconomic Pre 27.75 %   Socioeconomic Post 25.71 %   Socioeconomic % Change  -7.35 %   Psych/Spiritual Pre 28.29 %   Psych/Spiritual Post 27.36 %   Psych/Spiritual % Change -3.29 %   Family Pre 27.6 %   Family Post 23 %   Family % Change -16.67 %   GLOBAL Pre 26.77 %   GLOBAL Post 24.37 %   GLOBAL % Change -8.97 %      Personal Goals: Goals established at orientation with interventions provided to work toward goal.     Personal Goals and Risk Factors at Admission - 03/04/16 1347      Core Components/Risk Factors/Patient Goals on Admission    Weight Management Yes;Weight Loss   Intervention Weight Management: Develop a combined nutrition and exercise program designed to reach desired caloric intake, while maintaining appropriate intake of nutrient and fiber, sodium and fats, and appropriate energy expenditure required for the weight goal.;Weight Management: Provide education and appropriate resources to help participant work on and attain dietary goals.   Admit Weight 188 lb 4.8 oz (85.4 kg)   Goal Weight: Short Term 185 lb (83.9 kg)   Goal Weight: Long Term 180 lb (81.6 kg)   Expected Outcomes Long Term: Adherence to nutrition and physical activity/exercise program aimed toward attainment of established weight goal;Short Term: Continue to assess and modify interventions until short term weight is achieved;Weight Loss: Understanding of general recommendations for a balanced deficit meal plan, which promotes 1-2 lb weight loss per week and includes a negative energy balance of [phone] kcal/d;Understanding recommendations for meals to include 15-35% energy as protein, 25-35% energy from fat, 35-60% energy from carbohydrates, less than 200mg  of dietary cholesterol, 20-35 gm of total fiber daily;Understanding of distribution of calorie intake throughout the day with the consumption of 4-5 meals/snacks   Sedentary Yes    Intervention Develop an individualized exercise prescription for aerobic and resistive training based on initial evaluation  findings, risk stratification, comorbidities and participant's personal goals.;Provide advice, education, support and counseling about physical activity/exercise needs.   Expected Outcomes Achievement of increased cardiorespiratory fitness and enhanced flexibility, muscular endurance and strength shown through measurements of functional capacity and personal statement of participant.   Increase Strength and Stamina Yes   Intervention Provide advice, education, support and counseling about physical activity/exercise needs.;Develop an individualized exercise prescription for aerobic and resistive training based on initial evaluation findings, risk stratification, comorbidities and participant's personal goals.   Expected Outcomes Achievement of increased cardiorespiratory fitness and enhanced flexibility, muscular endurance and strength shown through measurements of functional capacity and personal statement of participant.   Hypertension Yes   Intervention Provide education on lifestyle modifcations including regular physical activity/exercise, weight management, moderate sodium restriction and increased consumption of fresh fruit, vegetables, and low fat dairy, alcohol moderation, and smoking cessation.;Monitor prescription use compliance.   Expected Outcomes Short Term: Continued assessment and intervention until BP is < 140/78mm HG in hypertensive participants. < 130/49mm HG in hypertensive participants with diabetes, heart failure or chronic kidney disease.;Long Term: Maintenance of blood pressure at goal levels.   Lipids Yes   Intervention Provide education and support for participant on nutrition & aerobic/resistive exercise along with prescribed medications to achieve LDL 70mg , HDL >40mg .   Expected Outcomes Short Term: Participant states understanding of desired cholesterol  values and is compliant with medications prescribed. Participant is following exercise prescription and nutrition guidelines.;Long Term: Cholesterol controlled with medications as prescribed, with individualized exercise RX and with personalized nutrition plan. Value goals: LDL < 70mg , HDL > 40 mg.       Personal Goals Discharge:     Goals and Risk Factor Review    Row Name 04/05/16 0847 04/05/16 5732 05/03/16 0758         Core Components/Risk Factors/Patient Goals Review   Personal Goals Review Weight Management/Obesity;Hypertension;Lipids  - Weight Management/Obesity;Hypertension;Lipids     Review Ed states he has changed his eating habits. More fruit per day several servings each day.No bacon sinced heart attack. Has decreased snacks, completely stopped eating ice cream.BP has improved since med chages. Cholesterol med remains compliant and will talk to his physician about lowering dose, since he is experiencing sme muscle asches.  Weight at 186 lbs today  almost at short term goal.  Weighed 204 Lbs at admission to hospital Weight is up some today, has lost 3 inches. Is eating heart healthy .  Had dumplings this week.  Weight up since.  Has decreased eating treats from daily to once in awhile. Has exercise equipment at home he is using. 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 Has lost 14 pounds since hospital stay.     Expected Outcomes Continued work on goals with adherence to medicatin compliance and nutrition and exercise habits  - Continued work on goals with adherence to medication compliance and nutrition and exercise habits.        Nutrition & Weight - Outcomes:     Pre Biometrics - 03/04/16 1512      Pre Biometrics   Height 5\' 10"  (1.778 m)   Weight 188 lb 4.8 oz (85.4 kg)   Waist Circumference 37 inches   Hip Circumference 41 inches   Waist to Hip Ratio 0.9 %   BMI (Calculated) 27.1   Single Leg Stand 3.28 seconds         Post Biometrics - 04/29/16  0909       Post  Biometrics  Height 5\' 10"  (1.778 m)   Weight 188 lb (85.3 kg)   Waist Circumference 34 inches   Hip Circumference 38.5 inches   Waist to Hip Ratio 0.88 %   BMI (Calculated) 27   Single Leg Stand 9.18 seconds      Nutrition:     Nutrition Therapy & Goals - 04/05/16 0838      Nutrition Therapy   Diet [address] appointment      Nutrition Discharge:     Nutrition Assessments - 04/26/16 0934      MEDFICTS Scores   Pre Score 39   Post Score 33   Score Difference -6      Education Questionnaire Score:     Knowledge Questionnaire Score - 04/29/16 0910      Knowledge Questionnaire Score   Pre Score 23/28   Post Score 25/28      Goals reviewed with patient; copy given to patient.

## 2016-05-06 NOTE — Progress Notes (Signed)
Cardiac Individual Treatment Plan  Patient Details  Name: Christopher Daniels MRN: [id number] Date of Birth: 02/21/1935 Referring Provider:   Flowsheet Row Cardiac Rehab from 03/04/2016 in [facility] Cardiac and Pulmonary Rehab  Referring Provider  [name] MD      Initial Encounter Date:  Flowsheet Row Cardiac Rehab from 03/04/2016 in [facility] Cardiac and Pulmonary Rehab  Date  03/04/16  Referring Provider  [name] MD      Visit Diagnosis: ST elevation myocardial infarction (STEMI), unspecified artery [facility])  Status post coronary artery stent placement  Patient's Home Medications on Admission:  Current Outpatient Prescriptions:  .  aspirin 81 MG tablet, Take 81 mg by mouth every evening. , Disp: , Rfl:  .  atorvastatin (LIPITOR) 80 MG tablet, Take 1 tablet (80 mg total) by mouth every evening., Disp: 30 tablet, Rfl: 3 .  carboxymethylcellulose (REFRESH TEARS) 0.5 % SOLN, Place 1 drop into both eyes as needed (for dry eyes)., Disp: , Rfl:  .  carvedilol (COREG) 6.25 MG tablet, Take 1 tablet (6.25 mg total) by mouth 2 (two) times daily with a meal., Disp: 60 tablet, Rfl: 2 .  co-enzyme Q-10 30 MG capsule, Take 30 mg by mouth daily., Disp: , Rfl:  .  lisinopril (PRINIVIL,ZESTRIL) 10 MG tablet, Take 1 tablet (10 mg total) by mouth daily., Disp: 30 tablet, Rfl: 2 .  nitroGLYCERIN (NITROSTAT) 0.4 MG SL tablet, Place 1 tablet (0.4 mg total) under the tongue every 5 (five) minutes x 3 doses as needed for chest pain., Disp: 25 tablet, Rfl: 3 .  omeprazole (PRILOSEC) 20 MG capsule, Take 20 mg by mouth every evening. , Disp: , Rfl:  .  ticagrelor (BRILINTA) 90 MG TABS tablet, Take 1 tablet (90 mg total) by mouth 2 (two) times daily., Disp: 60 tablet, Rfl: 0  Past Medical History: Past Medical History:  Diagnosis Date  . Arthritis    joints  . Cancer ([place])    skin/CLL. on head years ago  . Diverticulitis   . Dysrhythmia    bradycardia  . GERD (gastroesophageal reflux disease)   . H/O Bell's palsy    . HOH (hard of hearing)   . Kidney calculi    stones from years ago  . LBBB (left bundle branch block)     Tobacco Use: History  Smoking Status  . Never Smoker  Smokeless Tobacco  . Not on file    Labs: Recent Review Flowsheet Data    Labs for ITP Cardiac and Pulmonary Rehab Latest Ref Rng & Units 01/27/2016 01/27/2016 01/27/2016 01/27/2016 01/28/2016   Cholestrol 0 - 200 mg/dL - - 138 141 -   LDLCALC 0 - 99 mg/dL - - 90 103(H) -   HDL >40 mg/dL - - 29(L) 32(L) -   Trlycerides <150 mg/dL - - 93 32 -   Hemoglobin A1c 4.8 - 5.6 % - 5.7(H) - - 5.5   TCO2 0 - 100 mmol/L 24 - - - -       Exercise Target Goals:    Exercise Program Goal: Individual exercise prescription set with THRR, safety & activity barriers. Participant demonstrates ability to understand and report RPE using BORG scale, to self-measure pulse accurately, and to acknowledge the importance of the exercise prescription.  Exercise Prescription Goal: Starting with aerobic activity 30 plus minutes a day, 3 days per week for initial exercise prescription. Provide home exercise prescription and guidelines that participant acknowledges understanding prior to discharge.  Activity Barriers & Risk Stratification:  Activity Barriers & Cardiac Risk Stratification - 03/04/16 1350      Activity Barriers & Cardiac Risk Stratification   Activity Barriers None   Cardiac Risk Stratification High      6 Minute Walk:     6 Minute Walk    Row Name 03/04/16 1508 04/29/16 0908       6 Minute Walk   Phase Initial Discharge    Distance 1570 feet 1784 feet    Distance % Change  - 12 %  214 ft    Walk Time 6 minutes 6 minutes    # of Rest Breaks 0 0    MPH 2.97 3.38    METS 3.36 3.44    RPE 12 13    VO2 Peak 11.78 12.03    Symptoms No No    Resting HR 80 bpm 81 bpm    Resting BP 106/56 110/62    Max Ex. HR 112 bpm 110 bpm    Max Ex. BP 166/60 132/64    2 Minute Post BP 126/70  -       Oxygen Initial  Assessment:   Oxygen Re-Evaluation:   Oxygen Discharge (Final Oxygen Re-Evaluation):   Initial Exercise Prescription:     Initial Exercise Prescription - 03/04/16 1500      Date of Initial Exercise RX and Referring Provider   Date 03/04/16   Referring Provider [name] MD     Treadmill   MPH 2.7   Grade 0.5   Minutes 15   METs 3.25     NuStep   Level 2   SPM --  80-100 spm   Minutes 15   METs 2     Recumbant Elliptical   Level 1   RPM 50   Minutes 15   METs 2     Prescription Details   Frequency (times per week) 3   Duration Progress to 45 minutes of aerobic exercise without signs/symptoms of physical distress     Intensity   THRR 40-80% of Max Heartrate 104-129   Ratings of Perceived Exertion 11-15   Perceived Dyspnea 0-4     Progression   Progression Continue to progress workloads to maintain intensity without signs/symptoms of physical distress.     Resistance Training   Training Prescription Yes   Weight 3 lbs   Reps 10-15      Perform Capillary Blood Glucose checks as needed.  Exercise Prescription Changes:     Exercise Prescription Changes    Row Name 03/04/16 1300 03/19/16 1500 04/01/16 1500 04/03/16 0800 04/16/16 1500     Response to Exercise   Blood Pressure (Admit) 106/56 126/62 122/64  - 126/60   Blood Pressure (Exercise) 166/60 134/76 132/74  - 140/70   Blood Pressure (Exit) 126/70 124/72 122/68  - 104/70   Heart Rate (Admit) 80 bpm 72 bpm 72 bpm  - 63 bpm   Heart Rate (Exercise) 112 bpm 114 bpm 128 bpm  - 117 bpm   Heart Rate (Exit) 88 bpm 85 bpm 99 bpm  - 87 bpm   Oxygen Saturation (Admit) 98 %  -  -  -  -   Oxygen Saturation (Exercise) 91 %  -  -  -  -   Rating of Perceived Exertion (Exercise) _0 - 12   Symptoms none bradycardia-has not been feeling as good over weekend none none none   Comments  -  -  - Home Exercise Guidelines given 04/03/16 Home Exercise  Guidelines given 04/03/16   Duration  - Progress to 45 minutes of  aerobic exercise without signs/symptoms of physical distress Progress to 45 minutes of aerobic exercise without signs/symptoms of physical distress Progress to 45 minutes of aerobic exercise without signs/symptoms of physical distress Progress to 45 minutes of aerobic exercise without signs/symptoms of physical distress   Intensity  - THRR unchanged THRR unchanged THRR unchanged THRR unchanged     Progression   Progression  - Continue to progress workloads to maintain intensity without signs/symptoms of physical distress. Continue to progress workloads to maintain intensity without signs/symptoms of physical distress. Continue to progress workloads to maintain intensity without signs/symptoms of physical distress. Continue to progress workloads to maintain intensity without signs/symptoms of physical distress.   Average METs  - 2.62 3.34 3.34 4.2     Resistance Training   Training Prescription  - Yes Yes Yes Yes   Weight  - 3 lbs 4 lbs 4 lbs 4 lbs   Reps  - 10-15 10-15 10-15 10-15     Interval Training   Interval Training  - No No No No     Treadmill   MPH  - 2._0 Grade  - 0._1 Minutes  - _2 METs  - 3.25 3.71 3.71 3.71     NuStep   Level  - _3 Minutes  - _4 METs  - 2.8 3.5 3.5 4.2     Recumbant Elliptical   Level  - _5 4.5   RPM  - 50  -  -  -   Minutes  - _6 METs  - 1.8 2.8 2.8 4.7     Home Exercise Plan   Plans to continue exercise at  -  -  - Home  walking Home  walking   Frequency  -  -  - Add 3 additional days to program exercise sessions. Add 3 additional days to program exercise sessions.     Exercise Review   Progression -  walk test results Yes Yes Yes Yes   Row Name 04/30/16 1600             Response to Exercise   Blood Pressure (Admit) 110/62       Blood Pressure (Exercise) 142/75       Blood Pressure (Exit) 112/60       Heart Rate (Admit) 83 bpm       Heart Rate (Exercise) 127 bpm       Heart  Rate (Exit) 67 bpm       Rating of Perceived Exertion (Exercise) 13       Symptoms none       Duration Continue with 45 min of aerobic exercise without signs/symptoms of physical distress.       Intensity THRR unchanged         Progression   Progression Continue to progress workloads to maintain intensity without signs/symptoms of physical distress.       Average METs 3.72         Resistance Training   Training Prescription Yes       Weight 4 lbs       Reps 10-15         Interval Training   Interval Training No         Treadmill   MPH  3.2       Grade 1       Minutes 15       METs 3.45         NuStep   Level 6       Minutes 15       METs 3.6         Recumbant Elliptical   Level 4.5       Minutes 15       METs 4.1         Home Exercise Plan   Plans to continue exercise at -  walking       Frequency Add 3 additional days to program exercise sessions.       Initial Home Exercises Provided 04/03/16          Exercise Comments:     Exercise Comments    Row Name 03/19/16 1521 03/19/16 1522 04/01/16 1558 04/03/16 0856 04/16/16 1525   Exercise Comments First full day of exercise!  Patient was oriented to gym and equipment including functions, settings, policies, and procedures.  Patient's individual exercise prescription and treatment plan were reviewed.  All starting workloads were established based on the results of the 6 minute walk test done at initial orientation visit.  The plan for exercise progression was also introduced and progression will be customized based on patient's performance and goals. Brier is off to a good start with exercise.  He is only on day two for exercise.  He has had some chest pain and rhythm concerens that are being followed by both Dr. Einar Gip and Dr. Sabra Heck.  We will continue to monitor him closely and track his progression. Lowry has been doing well in rehab.  He is now up to level 3 on NuStep.  We will continue to monitor his progression. Reviewed  home exercise with pt today.  Pt plans to walk at home for exercise.  Reviewed THR, pulse, RPE, sign and symptoms, NTG use, and when to call 911 or MD.  Also discussed weather considerations and indoor options.  Pt voiced understanding. Barnaby has been doing well in rehab.  He is now up to level 4.5 on the recumbent elliptical and level 6 on the NuStep.  We will continue to monitor his progression.   [place] Name 04/24/16 [phone]           Exercise Comments Reviewed METs average and discussed progression with pt today.          Exercise Goals and Review:   Exercise Goals Re-Evaluation :     Exercise Goals Re-Evaluation    Row Name 04/30/16 1600 05/06/16 0851           Exercise Goal Re-Evaluation   Exercise Goals Review Increase Physical Activity;Increase Strenth and Stamina Increase Strenth and Stamina;Increase Physical Activity      Comments Omarri will be graduating soon!  He improved his walk test by 12%! We will continue to track his progression Demosthenes graduated today from cardiac rehab with 36 sessions completed.  Details of the patient's exercise prescription and what He needs to do in order to continue the prescription and progress were discussed with patient.  Patient was given a copy of prescription and goals.  Patient verbalized understanding.  Crystian plans to continue to exercise by walking at home.      Expected Outcomes Short: Graduate rehab.  Long: Independently exercise and maintain all he has gained. Dilon will continue to exercise at home!  Discharge Exercise Prescription (Final Exercise Prescription Changes):     Exercise Prescription Changes - 04/30/16 1600      Response to Exercise   Blood Pressure (Admit) 110/62   Blood Pressure (Exercise) 142/75   Blood Pressure (Exit) 112/60   Heart Rate (Admit) 83 bpm   Heart Rate (Exercise) 127 bpm   Heart Rate (Exit) 67 bpm   Rating of Perceived Exertion (Exercise) 13   Symptoms none   Duration Continue with 45 min of  aerobic exercise without signs/symptoms of physical distress.   Intensity THRR unchanged     Progression   Progression Continue to progress workloads to maintain intensity without signs/symptoms of physical distress.   Average METs 3.72     Resistance Training   Training Prescription Yes   Weight 4 lbs   Reps 10-15     Interval Training   Interval Training No     Treadmill   MPH 3.2   Grade 1   Minutes 15   METs 3.45     NuStep   Level 6   Minutes 15   METs 3.6     Recumbant Elliptical   Level 4.5   Minutes 15   METs 4.1     Home Exercise Plan   Plans to continue exercise at --  walking   Frequency Add 3 additional days to program exercise sessions.   Initial Home Exercises Provided 04/03/16      Nutrition:  Target Goals: Understanding of nutrition guidelines, daily intake of sodium <1531m, cholesterol <2063m calories 30% from fat and 7% or less from saturated fats, daily to have 5 or more servings of fruits and vegetables.  Biometrics:     Pre Biometrics - 03/04/16 1512      Pre Biometrics   Height _0  (1.778 m)   Weight 188 lb 4.8 oz (85.4 kg)   Waist Circumference 37 inches   Hip Circumference 41 inches   Waist to Hip Ratio 0.9 %   BMI (Calculated) 27.1   Single Leg Stand 3.28 seconds         Post Biometrics - 04/29/16 0909       Post  Biometrics   Height _1  (1.778 m)   Weight 188 lb (85.3 kg)   Waist Circumference 34 inches   Hip Circumference 38.5 inches   Waist to Hip Ratio 0.88 %   BMI (Calculated) 27   Single Leg Stand 9.18 seconds      Nutrition Therapy Plan and Nutrition Goals:     Nutrition Therapy & Goals - 04/05/16 0838      Nutrition Therapy   Diet [address] appointment      Nutrition Discharge: Rate Your Plate Scores:     Nutrition Assessments - 04/26/16 0934      MEDFICTS Scores   Pre Score 39   Post Score 33   Score Difference -6      Nutrition Goals Re-Evaluation:   Nutrition Goals Discharge  (Final Nutrition Goals Re-Evaluation):   Psychosocial: Target Goals: Acknowledge presence or absence of significant depression and/or stress, maximize coping skills, provide positive support system. Participant is able to verbalize types and ability to use techniques and skills needed for reducing stress and depression.   Initial Review & Psychosocial Screening:     Initial Psych Review & Screening - 03/04/16 1348      Initial Review   Current issues with --  None reported     Family Dynamics   Good Support  System? Yes  Wife, church, family     Barriers   Psychosocial barriers to participate in program There are no identifiable barriers or psychosocial needs.;The patient should benefit from training in stress management and relaxation.     Screening Interventions   Interventions Encouraged to exercise      Quality of Life Scores:      Quality of Life - 04/29/16 0910      Quality of Life Scores   Health/Function Pre 25.29 %   Health/Function Post 22.8 %   Health/Function % Change -9.85 %   Socioeconomic Pre 27.75 %   Socioeconomic Post 25.71 %   Socioeconomic % Change  -7.35 %   Psych/Spiritual Pre 28.29 %   Psych/Spiritual Post 27.36 %   Psych/Spiritual % Change -3.29 %   Family Pre 27.6 %   Family Post 23 %   Family % Change -16.67 %   GLOBAL Pre 26.77 %   GLOBAL Post 24.37 %   GLOBAL % Change -8.97 %      PHQ-9: Recent Review Flowsheet Data    Depression screen [facility] 2/9 04/29/2016 03/04/2016 01/30/2016   Decreased Interest 0 0 0   Down, Depressed, Hopeless 0 0 0   PHQ - 2 Score 0 0 0   Altered sleeping 0 0 -   Tired, decreased energy 1 0 -   Change in appetite 0 0 -   Feeling bad or failure about yourself  0 0 -   Trouble concentrating 0 0 -   Moving slowly or fidgety/restless 0 0 -   Suicidal thoughts 0 0 -   PHQ-9 Score 1 0 -   Difficult doing work/chores Not difficult at all Not difficult at all -     Interpretation of Total Score  Total Score  Depression Severity:  1-4 = Minimal depression, 5-9 = Mild depression, 10-14 = Moderate depression, 15-19 = Moderately severe depression, 20-27 = Severe depression   Psychosocial Evaluation and Intervention:     Psychosocial Evaluation - 04/15/16 0844      Psychosocial Evaluation & Interventions   Comments Counselor met with Mr. Austad who is a 32 year old who had a heart attack in December resulting in insertion of (2) stents.  He has a strong support system with a spouse of 75 years and a daughter who lives close by.  He reports sleeping well and having a good appetite.  Mr. Parcel denies a history of depression or anxiety or current symptoms.  He states he is typically in a positive mood; although his spouse has multiple health issues and he has been her primary caregiver the past 2 years.  His goals for this program are to get his heart stronger and increase his overall stamina.  Staff will follow with Mr. Bonser throughout the remainder of this program.        Psychosocial Re-Evaluation:     Psychosocial Re-Evaluation    [place] Name 05/03/16 [phone]             Psychosocial Re-Evaluation   Current issues with None Identified       Expected Outcomes Continue to maintain without stress       Continue Psychosocial Services  Follow up required by staff          Psychosocial Discharge (Final Psychosocial Re-Evaluation):     Psychosocial Re-Evaluation - 05/03/16 0756      Psychosocial Re-Evaluation   Current issues with None Identified   Expected Outcomes Continue to maintain  without stress   Continue Psychosocial Services  Follow up required by staff      Vocational Rehabilitation: Provide vocational rehab assistance to qualifying candidates.   Vocational Rehab Evaluation & Intervention:     Vocational Rehab - 03/04/16 1351      Initial Vocational Rehab Evaluation & Intervention   Assessment shows need for Vocational Rehabilitation No      Education: Education  Goals: Education classes will be provided on a weekly basis, covering required topics. Participant will state understanding/return demonstration of topics presented.  Learning Barriers/Preferences:     Learning Barriers/Preferences - 03/04/16 1350      Learning Barriers/Preferences   Learning Barriers None   Learning Preferences None      Education Topics: General Nutrition Guidelines/Fats and Fiber: -Group instruction provided by verbal, written material, models and posters to present the general guidelines for heart healthy nutrition. Gives an explanation and review of dietary fats and fiber. Flowsheet Row Cardiac Rehab from 05/06/2016 in [facility] Cardiac and Pulmonary Rehab  Date  04/29/16  Educator  CR  Instruction Review Code  2- meets goals/outcomes      Controlling Sodium/Reading Food Labels: -Group verbal and written material supporting the discussion of sodium use in heart healthy nutrition. Review and explanation with models, verbal and written materials for utilization of the food label. Flowsheet Row Cardiac Rehab from 05/06/2016 in [facility] Cardiac and Pulmonary Rehab  Date  05/06/16  Educator  CR  Instruction Review Code  2- meets goals/outcomes      Exercise Physiology & Risk Factors: - Group verbal and written instruction with models to review the exercise physiology of the cardiovascular system and associated critical values. Details cardiovascular disease risk factors and the goals associated with each risk factor. Flowsheet Row Cardiac Rehab from 05/06/2016 in [facility] Cardiac and Pulmonary Rehab  Date  03/18/16  Educator  [facility]  Instruction Review Code  2- meets goals/outcomes      Aerobic Exercise & Resistance Training: - Gives group verbal and written discussion on the health impact of inactivity. On the components of aerobic and resistive training programs and the benefits of this training and how to safely progress through these programs. Flowsheet Row Cardiac Rehab  from 05/06/2016 in [facility] Cardiac and Pulmonary Rehab  Date  03/20/16  Educator  [facility]  Instruction Review Code  2- meets goals/outcomes      Flexibility, Balance, General Exercise Guidelines: - Provides group verbal and written instruction on the benefits of flexibility and balance training programs. Provides general exercise guidelines with specific guidelines to those with heart or lung disease. Demonstration and skill practice provided. Flowsheet Row Cardiac Rehab from 05/06/2016 in [facility] Cardiac and Pulmonary Rehab  Date  03/25/16  Educator  [facility]  Instruction Review Code  2- meets goals/outcomes      Stress Management: - Provides group verbal and written instruction about the health risks of elevated stress, cause of high stress, and healthy ways to reduce stress. Flowsheet Row Cardiac Rehab from 05/06/2016 in [facility] Cardiac and Pulmonary Rehab  Date  04/03/16  Educator  [facility]  Instruction Review Code  2- meets goals/outcomes      Depression: - Provides group verbal and written instruction on the correlation between heart/lung disease and depressed mood, treatment options, and the stigmas associated with seeking treatment. Flowsheet Row Cardiac Rehab from 05/06/2016 in [facility] Cardiac and Pulmonary Rehab  Date  05/01/16  Educator  [facility]  Instruction Review Code  2- meets goals/outcomes  Anatomy & Physiology of the Heart: - Group verbal and written instruction and models provide basic cardiac anatomy and physiology, with the coronary electrical and arterial systems. Review of: AMI, Angina, Valve disease, Heart Failure, Cardiac Arrhythmia, Pacemakers, and the ICD. Flowsheet Row Cardiac Rehab from 05/06/2016 in [facility] Cardiac and Pulmonary Rehab  Date  04/01/16  Educator  MA  Instruction Review Code  2- meets goals/outcomes      Cardiac Procedures: - Group verbal and written instruction and models to describe the testing methods done to diagnose heart disease. Reviews the outcomes of the test  results. Describes the treatment choices: Medical Management, Angioplasty, or Coronary Bypass Surgery. Flowsheet Row Cardiac Rehab from 05/06/2016 in [facility] Cardiac and Pulmonary Rehab  Date  04/08/16  Educator  CE  Instruction Review Code  2- meets goals/outcomes      Cardiac Medications: - Group verbal and written instruction to review commonly prescribed medications for heart disease. Reviews the medication, class of the drug, and side effects. Includes the steps to properly store meds and maintain the prescription regimen. Flowsheet Row Cardiac Rehab from 05/06/2016 in [facility] Cardiac and Pulmonary Rehab  Date  04/15/16 [04/17/16 Second Class]  Educator  CE  Instruction Review Code  2- meets goals/outcomes      Go Sex-Intimacy & Heart Disease, Get SMART - Goal Setting: - Group verbal and written instruction through game format to discuss heart disease and the return to sexual intimacy. Provides group verbal and written material to discuss and apply goal setting through the application of the S.M.A.R.T. Method. Flowsheet Row Cardiac Rehab from 05/06/2016 in [facility] Cardiac and Pulmonary Rehab  Date  04/08/16  Educator  CE  Instruction Review Code  2- meets goals/outcomes      Other Matters of the Heart: - Provides group verbal, written materials and models to describe Heart Failure, Angina, Valve Disease, and Diabetes in the realm of heart disease. Includes description of the disease process and treatment options available to the cardiac patient. Flowsheet Row Cardiac Rehab from 05/06/2016 in [facility] Cardiac and Pulmonary Rehab  Date  04/01/16  Educator  MA  Instruction Review Code  2- meets goals/outcomes      Exercise & Equipment Safety: - Individual verbal instruction and demonstration of equipment use and safety with use of the equipment. Flowsheet Row Cardiac Rehab from 05/06/2016 in [facility] Cardiac and Pulmonary Rehab  Date  03/04/16  Educator  Sb  Instruction Review Code  2- meets  goals/outcomes      Infection Prevention: - Provides verbal and written material to individual with discussion of infection control including proper hand washing and proper equipment cleaning during exercise session. Flowsheet Row Cardiac Rehab from 05/06/2016 in [facility] Cardiac and Pulmonary Rehab  Date  03/04/16  Educator  Sb  Instruction Review Code  2- meets goals/outcomes      Falls Prevention: - Provides verbal and written material to individual with discussion of falls prevention and safety. Flowsheet Row Cardiac Rehab from 05/06/2016 in [facility] Cardiac and Pulmonary Rehab  Date  03/04/16  Educator  SB  Instruction Review Code  2- meets goals/outcomes      Diabetes: - Individual verbal and written instruction to review signs/symptoms of diabetes, desired ranges of glucose level fasting, after meals and with exercise. Advice that pre and post exercise glucose checks will be done for 3 sessions at entry of program.    Knowledge Questionnaire Score:     Knowledge Questionnaire Score - 04/29/16 0910  Knowledge Questionnaire Score   Pre Score 23/28   Post Score 25/28      Core Components/Risk Factors/Patient Goals at Admission:     Personal Goals and Risk Factors at Admission - 03/04/16 1347      Core Components/Risk Factors/Patient Goals on Admission    Weight Management Yes;Weight Loss   Intervention Weight Management: Develop a combined nutrition and exercise program designed to reach desired caloric intake, while maintaining appropriate intake of nutrient and fiber, sodium and fats, and appropriate energy expenditure required for the weight goal.;Weight Management: Provide education and appropriate resources to help participant work on and attain dietary goals.   Admit Weight 188 lb 4.8 oz (85.4 kg)   Goal Weight: Short Term 185 lb (83.9 kg)   Goal Weight: Long Term 180 lb (81.6 kg)   Expected Outcomes Long Term: Adherence to nutrition and physical activity/exercise  program aimed toward attainment of established weight goal;Short Term: Continue to assess and modify interventions until short term weight is achieved;Weight Loss: Understanding of general recommendations for a balanced deficit meal plan, which promotes 1-2 lb weight loss per week and includes a negative energy balance of [phone] kcal/d;Understanding recommendations for meals to include 15-35% energy as protein, 25-35% energy from fat, 35-60% energy from carbohydrates, less than 286m of dietary cholesterol, 20-35 gm of total fiber daily;Understanding of distribution of calorie intake throughout the day with the consumption of 4-5 meals/snacks   Sedentary Yes   Intervention Develop an individualized exercise prescription for aerobic and resistive training based on initial evaluation findings, risk stratification, comorbidities and participant's personal goals.;Provide advice, education, support and counseling about physical activity/exercise needs.   Expected Outcomes Achievement of increased cardiorespiratory fitness and enhanced flexibility, muscular endurance and strength shown through measurements of functional capacity and personal statement of participant.   Increase Strength and Stamina Yes   Intervention Provide advice, education, support and counseling about physical activity/exercise needs.;Develop an individualized exercise prescription for aerobic and resistive training based on initial evaluation findings, risk stratification, comorbidities and participant's personal goals.   Expected Outcomes Achievement of increased cardiorespiratory fitness and enhanced flexibility, muscular endurance and strength shown through measurements of functional capacity and personal statement of participant.   Hypertension Yes   Intervention Provide education on lifestyle modifcations including regular physical activity/exercise, weight management, moderate sodium restriction and increased consumption of fresh  fruit, vegetables, and low fat dairy, alcohol moderation, and smoking cessation.;Monitor prescription use compliance.   Expected Outcomes Short Term: Continued assessment and intervention until BP is < 140/935mHG in hypertensive participants. < 130/8021mG in hypertensive participants with diabetes, heart failure or chronic kidney disease.;Long Term: Maintenance of blood pressure at goal levels.   Lipids Yes   Intervention Provide education and support for participant on nutrition & aerobic/resistive exercise along with prescribed medications to achieve LDL <12m41mDL >40mg68mExpected Outcomes Short Term: Participant states understanding of desired cholesterol values and is compliant with medications prescribed. Participant is following exercise prescription and nutrition guidelines.;Long Term: Cholesterol controlled with medications as prescribed, with individualized exercise RX and with personalized nutrition plan. Value goals: LDL < 12mg,38m > 40 mg.      Core Components/Risk Factors/Patient Goals Review:      Goals and Risk Factor Review    Row Name 04/05/16 0847 04/05/16 0923 05/03/16 0758         Core Components/Risk Factors/Patient Goals Review   Personal Goals Review Weight Management/Obesity;Hypertension;Lipids  - Weight Management/Obesity;Hypertension;Lipids     Review Ed  states he has changed his eating habits. More fruit per day several servings each day.No bacon sinced heart attack. Has decreased snacks, completely stopped eating ice cream.BP has improved since med chages. Cholesterol med remains compliant and will talk to his physician about lowering dose, since he is experiencing sme muscle asches.  Weight at 186 lbs today  almost at short term goal.  Weighed 204 Lbs at admission to hospital Weight is up some today, has lost 3 inches. Is eating heart healthy .  Had dumplings this week.  Weight up since.  Has decreased eating treats from daily to once in awhile. Has exercise  equipment at home he is using. 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 Has lost 14 pounds since hospital stay.     Expected Outcomes Continued work on goals with adherence to medicatin compliance and nutrition and exercise habits  - Continued work on goals with adherence to medication compliance and nutrition and exercise habits.        Core Components/Risk Factors/Patient Goals at Discharge (Final Review):      Goals and Risk Factor Review - 05/03/16 0758      Core Components/Risk Factors/Patient Goals Review   Personal Goals Review Weight Management/Obesity;Hypertension;Lipids   Review Weight is up some today, has lost 3 inches. Is eating heart healthy .  Had dumplings this week.  Weight up since.  Has decreased eating treats from daily to once in awhile. Has exercise equipment at home he is using. 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 Has lost 14 pounds since hospital stay.   Expected Outcomes Continued work on goals with adherence to medication compliance and nutrition and exercise habits.      ITP Comments:     ITP Comments    Row Name 03/04/16 1338 03/08/16 0818 03/11/16 0847 03/18/16 1007 03/20/16 0822   ITP Comments Med review completed. Initial ITP created.  Documentation of diagnosis can be found in [facility] 01/27/2016 and 02/13/2016 admission Appointment made to meet with RD on 03/11/2016 Tanner came to rehab this morning and was complaining of chest pain/pressure over the weekend.  He said that it was left sided and different from what he was having previously.  He was sent to see his cardiologist for evaluation. I took the heart rate rhythm strips of 44 for Dr. Emily Filbert to see. We also did an EKG which he saw. I told Dr. Lynnae Prude said their are some days that [facility] doesn't feel well. Dr. Sabra Heck told Matyas to stop his Coreg (CArvidolol). Dr. Sabra Heck will ask his staff to call and make Tramon an appt to see Dr. Sabra Heck in a few days. Life  Vest is on. [place] Post sess Dr Sabra Heck tomorrow - he has decreased Coreg by 1/2 to wean off per prescription instruction.   [place] Name 03/27/16 0603 04/08/16 0956 04/24/16 0549       ITP Comments 30 day review. Continue with ITP unless directed changes per Medical Director review. Continues to have low blood pressure readings. No symptoms reported.  Kush will notify his physician of BP readings.  30 day review. Continue with ITP unless directed changes per Medical Director review        Comments: Discharge ITP

## 2016-05-06 NOTE — Progress Notes (Signed)
Daily Session Note  Patient Details  Name: Christopher Daniels MRN: [id number] Date of Birth: [birth year] Referring Provider:   Flowsheet Row Cardiac Rehab from 03/04/2016 in [facility] Cardiac and Pulmonary Rehab  Referring Provider  Christopher Prows MD      Encounter Date: 05/06/2016  Check In:     Session Check In - 05/06/16 0847      Check-In   Location [facility]-Cardiac & Pulmonary Rehab   Staff Present Christopher Sam, MA, ACSM RCEP, Exercise Physiologist;Christopher Daniels, BS, ACSM CEP, Exercise Physiologist;Christopher Hymas, RN, BSN   Supervising physician immediately available to respond to emergencies See telemetry face sheet for immediately available ER MD   Medication changes reported     No   Fall or balance concerns reported    No   Warm-up and Cool-down Performed on first and last piece of equipment   Resistance Training Performed Yes   VAD Patient? No     Pain Assessment   Currently in Pain? No/denies   Multiple Pain Sites No         History  Smoking Status  . Never Smoker  Smokeless Tobacco  . Not on file    Goals Met:  Independence with exercise equipment Exercise tolerated well Personal goals reviewed No report of cardiac concerns or symptoms Strength training completed today  Goals Unmet:  Not Applicable  Comments: Pt able to follow exercise prescription today without complaint.  Will continue to monitor for progression.  Christopher Daniels graduated today from cardiac rehab with 36 sessions completed.  Details of the patient's exercise prescription and what He needs to do in order to continue the prescription and progress were discussed with patient.  Patient was given a copy of prescription and goals.  Patient verbalized understanding.  Christopher Daniels plans to continue to exercise by walking at home.    Dr. Emily Daniels is Medical Director for [name].

## 2016-05-14 DIAGNOSIS — I252 Old myocardial infarction: Secondary | ICD-10-CM | POA: Diagnosis not present

## 2016-05-21 DIAGNOSIS — I252 Old myocardial infarction: Secondary | ICD-10-CM | POA: Diagnosis not present

## 2016-05-21 DIAGNOSIS — I2510 Atherosclerotic heart disease of native coronary artery without angina pectoris: Secondary | ICD-10-CM | POA: Diagnosis not present

## 2016-05-21 DIAGNOSIS — I255 Ischemic cardiomyopathy: Secondary | ICD-10-CM | POA: Diagnosis not present

## 2016-05-21 DIAGNOSIS — Z9861 Coronary angioplasty status: Secondary | ICD-10-CM | POA: Diagnosis not present

## 2016-05-31 DIAGNOSIS — D2372 Other benign neoplasm of skin of left lower limb, including hip: Secondary | ICD-10-CM | POA: Diagnosis not present

## 2016-05-31 DIAGNOSIS — M79672 Pain in left foot: Secondary | ICD-10-CM | POA: Diagnosis not present

## 2016-06-02 DIAGNOSIS — I213 ST elevation (STEMI) myocardial infarction of unspecified site: Secondary | ICD-10-CM | POA: Diagnosis not present

## 2016-06-06 ENCOUNTER — Encounter: Payer: Self-pay | Admitting: Internal Medicine

## 2016-06-06 ENCOUNTER — Ambulatory Visit (INDEPENDENT_AMBULATORY_CARE_PROVIDER_SITE_OTHER): Payer: PPO | Admitting: Internal Medicine

## 2016-06-06 VITALS — BP 120/58 | HR 38 | Ht 71.0 in | Wt 189.0 lb

## 2016-06-06 DIAGNOSIS — I255 Ischemic cardiomyopathy: Secondary | ICD-10-CM

## 2016-06-06 MED ORDER — LISINOPRIL 5 MG PO TABS: 5.0000 mg | ORAL_TABLET | Freq: Every day | ORAL | 3 refills | Status: DC

## 2016-06-06 MED ORDER — SPIRONOLACTONE 25 MG PO TABS: 12.5000 mg | ORAL_TABLET | Freq: Every day | ORAL | 3 refills | Status: DC

## 2016-06-06 NOTE — Patient Instructions (Addendum)
Medication Instructions: - Your physician has recommended you make the following change in your medication: 1) Decrease lisinopril to 5 mg- take 1 tablet by mouth once daily 2) Start aldactone (spironolactone) 25 mg- take 1/2 tablet (12.5 mg) by mouth once daily  Labwork: - Your physician recommends that you return for lab work in: 2 weeks (BMP)  Procedures/Testing: - Your physician has requested that you have a cardiac MRI- in 6 weeks (our scheduler will contact you to set this up. Cardiac MRI uses a computer to create images of your heart as its beating, producing both still and moving pictures of your heart and major blood vessels. For further information please visit [email or web address] Please follow the instruction sheet given to you today for more information.  Follow-Up: - Your physician recommends that you schedule a follow-up appointment in: 8 weeks with Dr. Caryl Comes.   Any Additional Special Instructions Will Be Listed Below (If Applicable). - You may stop wearing you LifeVest and mail this back to the company    If you need a refill on your cardiac medications before your next appointment, please call your pharmacy.

## 2016-06-06 NOTE — Progress Notes (Signed)
ELECTROPHYSIOLOGY CONSULT NOTE  Patient ID: Christopher Daniels, MRN: [id number], DOB/AGE: [birth year] 82 y.o. Admit date: (Not on file) Date of Consult: 06/06/2016  Primary Physician: Rusty Aus, MD Primary Cardiologist: JG>>[facility]   Christopher Daniels is being seen today for the evaluation of bradycardia at the request of  [name], MD.   HPI Christopher Daniels is a 82 y.o. male  Referred for consideration of an ICD.     He has a history of ischemic heart disease.  06/26 STEMI complicated by complete heart block in the context of a known history of left bundle branch block; he underwent DES stenting of his "super dominant " RCA.  LVEF at that time 25-30% which he been noted previously. He was given a LifeVest.  He underwent staged intervention of his LAD 2 weeks later. Ejection fraction at that time was 30-35%.  He has been managed with very low-dose carvedilol as noted below and lisinopril. It also had to be down titrated during cardiac rehabilitation because of hypotension which was asymptomatic  A recent echocardiogram 3/18 demonstrating EF of 36%, although it described as visually 25-30.  He has a history of sinus bradycardia which is long-standing.. 2016 Note reviewed from [facility] report the need to down titrate beta blockers.  He does not have issues of exercise intolerance. During cardiac rehabilitation his heart rate was noted to go to 115-20. He has not had syncope   there are times when he feels worse sitting down and up and about.   12/17 LDL 90 Past Medical History:  Diagnosis Date  . Arthritis    joints  . Cancer ([place])    skin/CLL. on head years ago  . Diverticulitis   . Dysrhythmia    bradycardia  . GERD (gastroesophageal reflux disease)   . H/O Bell's palsy   . HOH (hard of hearing)   . Kidney calculi    stones from years ago  . LBBB (left bundle branch block)       Surgical History:  Past Surgical History:  Procedure Laterality Date  . BACK SURGERY  1983   no metal  . CARDIAC CATHETERIZATION N/A 07/29/2014   Procedure: Left Heart Cath;  Surgeon: Teodoro Spray, MD;  Location: [place] [facility];  Service: Cardiovascular;  Laterality: N/A;  . CARDIAC CATHETERIZATION N/A 01/27/2016   Procedure: Left Heart Cath and Coronary Angiography;  Surgeon: [name], MD;  Location: [place] [facility];  Service: Cardiovascular;  Laterality: N/A;  . CARDIAC CATHETERIZATION N/A 01/27/2016   Procedure: Coronary Stent Intervention;  Surgeon: [name], MD;  Location: [place] [facility];  Service: Cardiovascular;  Laterality: N/A;  . CARDIAC CATHETERIZATION N/A 02/13/2016   Procedure: Coronary Stent Intervention;  Surgeon: [name], MD;  Location: [place] [facility];  Service: Cardiovascular;  Laterality: N/A;  . CARDIAC CATHETERIZATION N/A 02/13/2016   Procedure: Left Heart Cath and Coronary Angiography;  Surgeon: [name], MD;  Location: [place] [facility];  Service: Cardiovascular;  Laterality: N/A;  . CATARACT EXTRACTION W/PHACO Right 07/18/2014   Procedure: CATARACT EXTRACTION PHACO AND INTRAOCULAR LENS PLACEMENT (IOC);  Surgeon: Estill Cotta, MD;  Location: [facility] OR[facility];  Service: Ophthalmology;  Laterality: Right;  [place] 03:11 AP% 28.4 CDE 75.64  . CATARACT EXTRACTION W/PHACO Left 06/19/2015   Procedure: CATARACT EXTRACTION PHACO AND INTRAOCULAR LENS PLACEMENT (IOC);  Surgeon: Estill Cotta, MD;  Location: [facility] OR[facility];  Service: Ophthalmology;  Laterality: Left;  [place] 02:36.5 AP% 27.7 CDE  69.51 fluid pack lot # [id number]  . HERNIA REPAIR Left 2010   inguinal hernia     Home Meds: Prior to Admission medications   Medication Sig Start Date End Date Taking? Authorizing Provider  aspirin 81 MG tablet Take 81 mg by mouth every evening.    Yes Historical Provider, MD  atorvastatin (LIPITOR) 80 MG tablet Take 1 tablet (80 mg total) by mouth every evening. 01/29/16  Yes [name], MD  carboxymethylcellulose (REFRESH TEARS) 0.5 % SOLN Place 1 drop into both eyes as  needed (for dry eyes).   Yes Historical Provider, MD  carvedilol (COREG) 6.25 MG tablet Take 1 tablet (6.25 mg total) by mouth 2 (two) times daily with a meal. Patient taking differently: Take 3.125 mg by mouth 2 (two) times daily with a meal.  01/29/16  Yes [name], MD  co-enzyme Q-10 30 MG capsule Take 30 mg by mouth daily.   Yes Historical Provider, MD  lisinopril (PRINIVIL,ZESTRIL) 10 MG tablet Take 1 tablet (10 mg total) by mouth daily. 01/29/16  Yes [name], MD  nitroGLYCERIN (NITROSTAT) 0.4 MG SL tablet Place 1 tablet (0.4 mg total) under the tongue every 5 (five) minutes x 3 doses as needed for chest pain. 01/29/16  Yes [name], MD  omeprazole (PRILOSEC) 20 MG capsule Take 20 mg by mouth every evening.    Yes Historical Provider, MD  ticagrelor (BRILINTA) 90 MG TABS tablet Take 1 tablet (90 mg total) by mouth 2 (two) times daily. 01/29/16  Yes [name], MD    Allergies:  Allergies  Allergen Reactions  . Niaspan [Niacin Er] Anaphylaxis and Other (See Comments)    Episode happened 10 to 15 years ago.  Has not tried any other similar meds since    Social History   Social History  . Marital status: Married    Spouse name: N/A  . Number of children: N/A  . Years of education: N/A   Occupational History  . Not on file.   Social History Main Topics  . Smoking status: Former Smoker    Years: 4.00    Types: Cigars  . Smokeless tobacco: Never Used  . Alcohol use No  . Drug use: No  . Sexual activity: Not on file   Other Topics Concern  . Not on file   Social History Narrative  . No narrative on file     Family History  Problem Relation Age of Onset  . Heart failure Father      ROS:  Please see the history of present illness.     All other systems reviewed and negative.    Physical Exam: Blood pressure (!) 120/58, pulse (!) 38, height 5\' 11"  (1.803 m), weight 189 lb (85.7 kg). General: Well developed, well nourished male in no acute distress.Life Vest in  place Head: Normocephalic, atraumatic, sclera non-icteric, no xanthomas, nares are without discharge. EENT: normal  Lymph Nodes:  none Neck: Negative for carotid bruits. JVD not elevated. Back:without scoliosis kyphosis Lungs: Clear bilaterally to auscultation without wheezes, rales, or rhonchi. Breathing is unlabored. Heart: RRR with S1 S2. No  murmur . No rubs, or gallops appreciated. Abdomen: Soft, non-tender, non-distended with normoactive bowel sounds. No hepatomegaly. No rebound/guarding. No obvious abdominal masses. Msk:  Strength and tone appear normal for age. Extremities: No clubbing or cyanosis. No  edema.  Distal pedal pulses are 2+ and equal bilaterally. Skin: Warm and Dry Neuro: Alert and oriented X 3. CN III-XII intact Grossly normal sensory  and motor function . Psych:  Responds to questions appropriately with a normal affect.      Labs: Cardiac Enzymes No results for input(s): CKTOTAL, CKMB, TROPONINI in the last 72 hours. CBC Lab Results  Component Value Date   WBC 21.9 (H) 01/28/2016   HGB 13.1 01/28/2016   HCT 39.6 01/28/2016   MCV 94.5 01/28/2016   PLT 202 01/28/2016   PROTIME: No results for input(s): LABPROT, INR in the last 72 hours. Chemistry No results for input(s): NA, K, CL, CO2, BUN, CREATININE, CALCIUM, PROT, BILITOT, ALKPHOS, ALT, AST, GLUCOSE in the last 168 hours.  Invalid input(s): LABALBU Lipids Lab Results  Component Value Date   CHOL 141 01/27/2016   HDL 32 (L) 01/27/2016   LDLCALC 103 (H) 01/27/2016   TRIG 32 01/27/2016   BNP No results found for: PROBNP Thyroid Function Tests: No results for input(s): TSH, T4TOTAL, T3FREE, THYROIDAB in the last 72 hours.  Invalid input(s): FREET3 Miscellaneous No results found for: DDIMER  Radiology/Studies:  No results found.  EKG:  Personally reviewed   Sinus 38  21/17/42 LBBB   Assessment and Plan:  Sinus bradycardia with reasonable chronotropic competence  Ischemic cardiomyopathy   status post DES 2   12/17  Congestive heart failure class II  Left bundle branch block  Hyperlipidemia  The patient has persistent LV dysfunction although it is improved considerably since his MI 12/17. Most recent echocardiogram had an ejection fraction in the 30-35% range, indeed it was measured at 36%.  Medical therapy option augmentation include the addition of Aldactone for which his kidney function and potassium level are Reasonable, Entresto although he has had a history of hypotension in the incremental benefit of spironolactone is greater than incremental benefit of Entresto. There have never been data supporting the hypothesis of the use of pacing to support beta blockers.  Recent data from [facility] regarding the LifeVest and his improved LV function does not support the ongoing use of the LifeVest. We will discontinue it  He is most worried about his bradycardia. He has no clearly attributable symptoms and has good chronotropic competence. However, it has been progressive over years and will likely continue so.  The issue as to whether he would have a ejection fraction that would justify pacing and/or resynchronization. Hence, we will initiate Aldactone. We will follow his metabolic profile. We will reevaluate LVEF by MR for more accurate assessment and then make a decision regarding device implantation. I reviewed this extensively with him and his wife and his daughter.   I have assured him that his atorvastatin 80 mg is the appropriate dose given his dyslipidemia and his coronary artery disease         Virl Axe

## 2016-06-07 ENCOUNTER — Telehealth: Payer: Self-pay | Admitting: Internal Medicine

## 2016-06-07 NOTE — Telephone Encounter (Signed)
Called the patient to find out when he wanted his cardiac MRI.  He prefers any day of the week in the morning.  Message sent to MRI scheduling and precert.

## 2016-06-10 ENCOUNTER — Telehealth: Payer: Self-pay | Admitting: Internal Medicine

## 2016-06-10 MED ORDER — CARVEDILOL 3.125 MG PO TABS: 3.1250 mg | ORAL_TABLET | Freq: Two times a day (BID) | ORAL | 3 refills | Status: DC

## 2016-06-10 NOTE — Telephone Encounter (Signed)
°*  STAT* If patient is at the pharmacy, call can be transferred to refill team.   1. Which medications need to be refilled? (please list name of each medication and dose if known)   Carvedilol 3.125 mg po bid  2. Which pharmacy/location (including street and city if local pharmacy) is medication to be sent to?   [facility] [address] [place]   3. Do they need a 30 day or 90 day supply? [place]

## 2016-06-10 NOTE — Telephone Encounter (Signed)
OK to refill carvedilol- please find out if he would rather have the 3.125 mg tablets so he doesn't have to cut the 6.25 mg in half.  Thanks!

## 2016-06-10 NOTE — Telephone Encounter (Signed)
Spoke with patient and he requested the 3.125 dosage. Prescription entered and sent to pharmacy.

## 2016-06-10 NOTE — Telephone Encounter (Signed)
Will you please advise. On patients medication list it states,   Take 1 tablet (6.25 mg total) by mouth 2 (two) times daily with a meal.  Patient taking differently: Take 3.125 mg by mouth 2 (two) times daily with a meal

## 2016-06-10 NOTE — Telephone Encounter (Signed)
Spoke with patient and he states that this medication was originally ordered by Dr. Einar Gip and he is no longer seeing him and now he is seeing Dr. Caryl Comes. He states that he takes Carvedilol 6.25 mg 1/2 tablet twice daily. Let him know that since we have never prescribed this for him I would need to get approval from the physician before sending this in to his pharmacy. He verbalized understanding and had no further questions at this time.

## 2016-06-17 ENCOUNTER — Inpatient Hospital Stay: Payer: PPO | Attending: Oncology | Admitting: Oncology

## 2016-06-17 ENCOUNTER — Inpatient Hospital Stay (HOSPITAL_BASED_OUTPATIENT_CLINIC_OR_DEPARTMENT_OTHER): Payer: PPO

## 2016-06-17 VITALS — BP 124/52 | HR 42 | Temp 96.0°F | Resp 18 | Wt 186.4 lb

## 2016-06-17 DIAGNOSIS — K219 Gastro-esophageal reflux disease without esophagitis: Secondary | ICD-10-CM | POA: Diagnosis not present

## 2016-06-17 DIAGNOSIS — R000 Tachycardia, unspecified: Secondary | ICD-10-CM | POA: Insufficient documentation

## 2016-06-17 DIAGNOSIS — Z87442 Personal history of urinary calculi: Secondary | ICD-10-CM | POA: Insufficient documentation

## 2016-06-17 DIAGNOSIS — I252 Old myocardial infarction: Secondary | ICD-10-CM | POA: Diagnosis not present

## 2016-06-17 DIAGNOSIS — Z87891 Personal history of nicotine dependence: Secondary | ICD-10-CM | POA: Insufficient documentation

## 2016-06-17 DIAGNOSIS — M129 Arthropathy, unspecified: Secondary | ICD-10-CM | POA: Insufficient documentation

## 2016-06-17 DIAGNOSIS — Z85828 Personal history of other malignant neoplasm of skin: Secondary | ICD-10-CM

## 2016-06-17 DIAGNOSIS — Z79899 Other long term (current) drug therapy: Secondary | ICD-10-CM

## 2016-06-17 DIAGNOSIS — I447 Left bundle-branch block, unspecified: Secondary | ICD-10-CM | POA: Insufficient documentation

## 2016-06-17 DIAGNOSIS — Z7982 Long term (current) use of aspirin: Secondary | ICD-10-CM | POA: Diagnosis not present

## 2016-06-17 DIAGNOSIS — K5792 Diverticulitis of intestine, part unspecified, without perforation or abscess without bleeding: Secondary | ICD-10-CM | POA: Diagnosis not present

## 2016-06-17 DIAGNOSIS — C9110 Chronic lymphocytic leukemia of B-cell type not having achieved remission: Secondary | ICD-10-CM

## 2016-06-17 LAB — CBC WITH DIFFERENTIAL/PLATELET
Basophils Absolute: 0 10*3/uL (ref 0–0.1)
Basophils Relative: 0 %
EOS ABS: 0.1 10*3/uL (ref 0–0.7)
Eosinophils Relative: 1 %
HCT: 35.4 % — ABNORMAL LOW (ref 40.0–52.0)
Hemoglobin: 12 g/dL — ABNORMAL LOW (ref 13.0–18.0)
LYMPHS ABS: 8.5 10*3/uL — AB (ref 1.0–3.6)
Lymphocytes Relative: 70 %
MCH: 32.4 pg (ref 26.0–34.0)
MCHC: 33.9 g/dL (ref 32.0–36.0)
MCV: 95.5 fL (ref 80.0–100.0)
MONO ABS: 0.6 10*3/uL (ref 0.2–1.0)
MONOS PCT: 5 %
NEUTROS ABS: 2.9 10*3/uL (ref 1.4–6.5)
NEUTROS PCT: 24 %
PLATELETS: 230 10*3/uL (ref 150–440)
RBC: 3.71 MIL/uL — ABNORMAL LOW (ref 4.40–5.90)
RDW: 13.8 % (ref 11.5–14.5)
WBC: 12.2 10*3/uL — ABNORMAL HIGH (ref 3.8–10.6)

## 2016-06-17 NOTE — Progress Notes (Signed)
States had heart attack last December and is taking new cardiac meds. Questions if meds will have effect on blood counts. Feeling well today, no complaints.

## 2016-06-17 NOTE — Progress Notes (Signed)
Christopher Daniels  [phone]) [phone] Fax:(336) [phone]  ID: Christopher Daniels OB: 14-Jun-1934  MR#: [id number]  AJG#:[id number]  Patient Care Team: Rusty Aus, MD as PCP - General (Internal Medicine)  CHIEF COMPLAINT: CLL  INTERVAL HISTORY: Patient returns to clinic today for laboratory work and further evaluation.  He had a recent MI and continues to follow-up closely with cardiology for medication adjustment. He otherwise feels well and is asymptomatic.  He denies any recent fevers or illnesses.  He denies any night sweats.  He has a good appetite and denies weight loss.  He has no neurologic complaints.  He denies any chest pain or shortness of breath.  He denies any nausea, vomiting, constipation, or diarrhea.  He has no urinary complaints.  Patient offers no specific complaints today.  REVIEW OF SYSTEMS:   Review of Systems  Constitutional: Negative for fever, malaise/fatigue and weight loss.  Respiratory: Negative.  Negative for cough and shortness of breath.   Cardiovascular: Negative.  Negative for chest pain and leg swelling.  Gastrointestinal: Negative.  Negative for abdominal pain.  Genitourinary: Negative.   Musculoskeletal: Negative.   Skin: Negative.  Negative for rash.  Neurological: Negative.  Negative for sensory change and weakness.  Psychiatric/Behavioral: Negative.  The patient is not nervous/anxious.     As per HPI. Otherwise, a complete review of systems is negative.  PAST MEDICAL HISTORY: Past Medical History:  Diagnosis Date  . Arthritis    joints  . Cancer ([place])    skin/CLL. on head years ago  . Diverticulitis   . Dysrhythmia    bradycardia  . GERD (gastroesophageal reflux disease)   . H/O Bell's palsy   . HOH (hard of hearing)   . Kidney calculi    stones from years ago  . LBBB (left bundle branch block)     PAST SURGICAL HISTORY: Past Surgical History:  Procedure Laterality Date  . BACK SURGERY  1983   no metal  .  CARDIAC CATHETERIZATION N/A 07/29/2014   Procedure: Left Heart Cath;  Surgeon: Teodoro Spray, MD;  Location: [place] [facility];  Service: Cardiovascular;  Laterality: N/A;  . CARDIAC CATHETERIZATION N/A 01/27/2016   Procedure: Left Heart Cath and Coronary Angiography;  Surgeon: [name], MD;  Location: [place] [facility];  Service: Cardiovascular;  Laterality: N/A;  . CARDIAC CATHETERIZATION N/A 01/27/2016   Procedure: Coronary Stent Intervention;  Surgeon: [name], MD;  Location: [place] [facility];  Service: Cardiovascular;  Laterality: N/A;  . CARDIAC CATHETERIZATION N/A 02/13/2016   Procedure: Coronary Stent Intervention;  Surgeon: [name], MD;  Location: [place] [facility];  Service: Cardiovascular;  Laterality: N/A;  . CARDIAC CATHETERIZATION N/A 02/13/2016   Procedure: Left Heart Cath and Coronary Angiography;  Surgeon: [name], MD;  Location: [place] [facility];  Service: Cardiovascular;  Laterality: N/A;  . CATARACT EXTRACTION W/PHACO Right 07/18/2014   Procedure: CATARACT EXTRACTION PHACO AND INTRAOCULAR LENS PLACEMENT (IOC);  Surgeon: Estill Cotta, MD;  Location: [facility];  Service: Ophthalmology;  Laterality: Right;  [place] 03:11 AP% 28.4 CDE 75.64  . CATARACT EXTRACTION W/PHACO Left 06/19/2015   Procedure: CATARACT EXTRACTION PHACO AND INTRAOCULAR LENS PLACEMENT (IOC);  Surgeon: Estill Cotta, MD;  Location: [facility];  Service: Ophthalmology;  Laterality: Left;  [place] 02:36.5 AP% 27.7 CDE 69.51 fluid pack lot # [id number]  . HERNIA REPAIR Left 2010   inguinal hernia    FAMILY HISTORY: Reviewed and unchanged. No reported history of malignancy or  chronic disease.     ADVANCED DIRECTIVES:    HEALTH MAINTENANCE: Social History  Substance Use Topics  . Smoking status: Former Smoker    Years: 4.00    Types: Cigars  . Smokeless tobacco: Never Used  . Alcohol use No     Colonoscopy:  PAP:  Bone density:  Lipid panel:  Allergies  Allergen Reactions  . Niaspan  [Niacin Er] Anaphylaxis and Other (See Comments)    Episode happened 10 to 15 years ago.  Has not tried any other similar meds since    Current Outpatient Prescriptions  Medication Sig Dispense Refill  . aspirin 81 MG tablet Take 81 mg by mouth every evening.     Marland Kitchen atorvastatin (LIPITOR) 80 MG tablet Take 1 tablet (80 mg total) by mouth every evening. 30 tablet 3  . carboxymethylcellulose (REFRESH TEARS) 0.5 % SOLN Place 1 drop into both eyes as needed (for dry eyes).    . carvedilol (COREG) 3.125 MG tablet Take 1 tablet (3.125 mg total) by mouth 2 (two) times daily. 60 tablet 3  . co-enzyme Q-10 30 MG capsule Take 30 mg by mouth daily.    Marland Kitchen lisinopril (PRINIVIL,ZESTRIL) 5 MG tablet Take 1 tablet (5 mg total) by mouth daily. 90 tablet 3  . nitroGLYCERIN (NITROSTAT) 0.4 MG SL tablet Place 1 tablet (0.4 mg total) under the tongue every 5 (five) minutes x 3 doses as needed for chest pain. 25 tablet 3  . omeprazole (PRILOSEC) 20 MG capsule Take 20 mg by mouth every evening.     Marland Kitchen spironolactone (ALDACTONE) 25 MG tablet Take 0.5 tablets (12.5 mg total) by mouth daily. 45 tablet 3  . ticagrelor (BRILINTA) 90 MG TABS tablet Take 1 tablet (90 mg total) by mouth 2 (two) times daily. 60 tablet 0   No current facility-administered medications for this visit.     OBJECTIVE: Vitals:   06/17/16 1016  BP: (!) 124/52  Pulse: (!) 42  Resp: 18  Temp: (!) 96 F (35.6 C)     Body mass index is 26 kg/m.    ECOG FS:0 - Asymptomatic  General: Well-developed, well-nourished, no acute distress. Eyes: Pink conjunctiva, anicteric sclera. Lungs: Clear to auscultation bilaterally. Heart: Regular rate and rhythm. No rubs, murmurs, or gallops. Abdomen: Soft, nontender, nondistended. No organomegaly noted, normoactive bowel sounds. Musculoskeletal: No edema, cyanosis, or clubbing. Neuro: Alert, answering all questions appropriately. Cranial nerves grossly intact. Skin: No rashes or petechiae noted. Psych:  Normal affect. Lymphatics: No cervical, calvicular, axillary or inguinal LAD.   LAB RESULTS:  Lab Results  Component Value Date   NA 139 01/28/2016   K 4.0 01/28/2016   CL 106 01/28/2016   CO2 27 01/28/2016   GLUCOSE 115 (H) 01/28/2016   BUN 8 01/28/2016   CREATININE 0.92 01/28/2016   CALCIUM 8.3 (L) 01/28/2016   PROT 5.6 (L) 01/27/2016   ALBUMIN 3.4 (L) 01/27/2016   AST 21 01/27/2016   ALT 17 01/27/2016   ALKPHOS 53 01/27/2016   BILITOT 0.7 01/27/2016   GFRNONAA >60 01/28/2016   GFRAA >60 01/28/2016    Lab Results  Component Value Date   WBC 12.2 (H) 06/17/2016   NEUTROABS 2.9 06/17/2016   HGB 12.0 (L) 06/17/2016   HCT 35.4 (L) 06/17/2016   MCV 95.5 06/17/2016   PLT 230 06/17/2016     STUDIES: No results found.  ASSESSMENT: CLL, Rai stage 0  PLAN:    1.  CLL:  Patient's white count is elevated, but  essentially unchanged.  Baseline CT in August 2014 was reviewed independently and did not reveal any underlying lymphadenopathy.  This does not need to be repeated unless treatment is necessary.  No intervention is needed at this time since he is a stage 0.  He does not require a bone marrow biopsy at this time.  Return to clinic in 6 months for laboratory work only and then in one year for laboratory work and further evaluation. 2. Cardiac disease: Continue monitoring and treatment per cardiology.  Approximately 20 minutes was spent in discussion of which greater than 50% was consultation.  Patient expressed understanding and was in agreement with this plan. He also understands that He can call clinic at any time with any questions, concerns, or complaints.    Christopher Huger, MD   06/17/2016 10:23 AM

## 2016-06-19 ENCOUNTER — Encounter: Payer: Self-pay | Admitting: Internal Medicine

## 2016-06-19 ENCOUNTER — Telehealth: Payer: Self-pay | Admitting: Internal Medicine

## 2016-06-19 ENCOUNTER — Other Ambulatory Visit (INDEPENDENT_AMBULATORY_CARE_PROVIDER_SITE_OTHER): Payer: PPO

## 2016-06-19 DIAGNOSIS — I255 Ischemic cardiomyopathy: Secondary | ICD-10-CM | POA: Diagnosis not present

## 2016-06-19 DIAGNOSIS — D2372 Other benign neoplasm of skin of left lower limb, including hip: Secondary | ICD-10-CM | POA: Diagnosis not present

## 2016-06-19 DIAGNOSIS — M79672 Pain in left foot: Secondary | ICD-10-CM | POA: Diagnosis not present

## 2016-06-19 NOTE — Telephone Encounter (Signed)
Called patient and left VM with his cardiac MRI appointment scheduled for 07-01-16 at 8:30 a.m., at [facility].  Letter mailed today.  Message to nurse.

## 2016-06-20 ENCOUNTER — Other Ambulatory Visit: Payer: PPO

## 2016-06-20 LAB — BASIC METABOLIC PANEL
BUN / CREAT RATIO: 13 (ref 10–24)
BUN: 10 mg/dL (ref 8–27)
CHLORIDE: 102 mmol/L (ref 96–106)
CO2: 26 mmol/L (ref 18–29)
Calcium: 8.8 mg/dL (ref 8.6–10.2)
Creatinine, Ser: 0.79 mg/dL (ref 0.76–1.27)
GFR calc Af Amer: 98 mL/min/{1.73_m2} (ref >59)
GFR calc non Af Amer: 85 mL/min/{1.73_m2} (ref >59)
GLUCOSE: 95 mg/dL (ref 65–99)
POTASSIUM: 4.5 mmol/L (ref 3.5–5.2)
SODIUM: 142 mmol/L (ref 134–144)

## 2016-06-27 ENCOUNTER — Telehealth: Payer: Self-pay

## 2016-06-27 NOTE — Telephone Encounter (Signed)
Patients wife is aware and agreeable to normal results. She said she would relay the message

## 2016-07-01 ENCOUNTER — Ambulatory Visit (HOSPITAL_COMMUNITY)
Admission: RE | Admit: 2016-07-01 | Discharge: 2016-07-01 | Disposition: A | Discharge status: Home / Self Care | Payer: PPO | Source: Ambulatory Visit | Attending: Internal Medicine | Admitting: Internal Medicine

## 2016-07-01 DIAGNOSIS — I255 Ischemic cardiomyopathy: Secondary | ICD-10-CM | POA: Insufficient documentation

## 2016-07-01 LAB — CREATININE, SERUM: CREATININE: 0.91 mg/dL (ref 0.61–1.24)

## 2016-07-01 MED ORDER — GADOBENATE DIMEGLUMINE 529 MG/ML IV SOLN
30.0000 mL | Freq: Once | INTRAVENOUS | Status: AC
  Administered 2016-07-01: 28 mL via INTRAVENOUS

## 2016-07-10 ENCOUNTER — Telehealth: Payer: Self-pay | Admitting: Internal Medicine

## 2016-07-10 NOTE — Telephone Encounter (Signed)
Spoke with patients wife per release form. Let her know that results are still pending and that someone would call with results when they are available. She was appreciative for the call back and had no further questions at this time.

## 2016-07-10 NOTE — Telephone Encounter (Signed)
Pt would like Cardiac MRI results.

## 2016-08-01 ENCOUNTER — Other Ambulatory Visit
Admission: RE | Admit: 2016-08-01 | Discharge: 2016-08-01 | Disposition: A | Discharge status: Home / Self Care | Payer: PPO | Source: Ambulatory Visit | Attending: Internal Medicine | Admitting: Internal Medicine

## 2016-08-01 ENCOUNTER — Encounter: Payer: Self-pay | Admitting: Internal Medicine

## 2016-08-01 ENCOUNTER — Ambulatory Visit (INDEPENDENT_AMBULATORY_CARE_PROVIDER_SITE_OTHER): Payer: PPO | Admitting: Internal Medicine

## 2016-08-01 VITALS — BP 112/52 | HR 38 | Ht 71.0 in | Wt 183.0 lb

## 2016-08-01 DIAGNOSIS — R001 Bradycardia, unspecified: Secondary | ICD-10-CM | POA: Diagnosis not present

## 2016-08-01 DIAGNOSIS — R079 Chest pain, unspecified: Secondary | ICD-10-CM | POA: Diagnosis not present

## 2016-08-01 DIAGNOSIS — I509 Heart failure, unspecified: Secondary | ICD-10-CM | POA: Diagnosis not present

## 2016-08-01 DIAGNOSIS — I447 Left bundle-branch block, unspecified: Secondary | ICD-10-CM

## 2016-08-01 DIAGNOSIS — I255 Ischemic cardiomyopathy: Secondary | ICD-10-CM | POA: Insufficient documentation

## 2016-08-01 LAB — BASIC METABOLIC PANEL
Anion gap: 7 (ref 5–15)
BUN: 14 mg/dL (ref 6–20)
CHLORIDE: 104 mmol/L (ref 101–111)
CO2: 27 mmol/L (ref 22–32)
Calcium: 9 mg/dL (ref 8.9–10.3)
Creatinine, Ser: 0.96 mg/dL (ref 0.61–1.24)
GFR calc Af Amer: >60 mL/min (ref >60)
GFR calc non Af Amer: >60 mL/min (ref >60)
GLUCOSE: 102 mg/dL — AB (ref 65–99)
POTASSIUM: 4.5 mmol/L (ref 3.5–5.1)
Sodium: 138 mmol/L (ref 135–145)

## 2016-08-01 LAB — LIPID PANEL
CHOL/HDL RATIO: 3.4 ratio
Cholesterol: 109 mg/dL (ref 0–200)
HDL: 32 mg/dL — ABNORMAL LOW (ref >40)
LDL Cholesterol: 62 mg/dL (ref 0–99)
Triglycerides: 73 mg/dL (ref <150)
VLDL: 15 mg/dL (ref 0–40)

## 2016-08-01 LAB — TROPONIN I: Troponin I: <0.03 ng/mL (ref <0.03)

## 2016-08-01 MED ORDER — SACUBITRIL-VALSARTAN 24-26 MG PO TABS: 1.0000 | ORAL_TABLET | Freq: Two times a day (BID) | ORAL | 6 refills | Status: DC

## 2016-08-01 MED ORDER — SACUBITRIL-VALSARTAN 24-26 MG PO TABS: 1.0000 | ORAL_TABLET | Freq: Two times a day (BID) | ORAL | 0 refills | Status: DC

## 2016-08-01 NOTE — Patient Instructions (Signed)
Medication Instructions: - Your physician has recommended you make the following change in your medication: 1) Stop lisinopril 2) Stop coreg (carvedilol) 3) Start entresto 24/26 mg- take one tablet by mouth twice daily ** you will need to wait at least 36 hours from stopping lisinopril to starting entresto **  Labwork: - Your physician recommends that you have lab work today: Troponin/ BMP/ Lipid panel ** this will need to be done at the [place] area**  Procedures/Testing: - none ordered  Follow-Up: - Your physician recommends that you schedule a follow-up appointment in: 3 months with Dr. Caryl Comes.   Any Additional Special Instructions Will Be Listed Below (If Applicable).     If you need a refill on your cardiac medications before your next appointment, please call your pharmacy.

## 2016-08-01 NOTE — Progress Notes (Signed)
Patient Care Team: Rusty Aus, MD as PCP - General (Internal Medicine)   HPI  Christopher Daniels is a 82 y.o. male seen in folowuip of sinus bradycardia left ventricular dysfunction the setting of a recent MI.  He has a history of ischemic heart disease.  41/28 STEMI complicated by complete heart block in the context of a known history of left bundle branch block; he underwent DES stenting of his "super dominant " RCA.  LVEF at that time 25-30% which he been noted previously. He was given a LifeVest.  He underwent staged intervention of his LAD 2 weeks later. Ejection fraction at that time was 30-35%.  He has been managed with very low-dose carvedilol as noted below and lisinopril. It also had to be down titrated during cardiac rehabilitation because of hypotension which was asymptomatic  Echocardiogram 3/18 demonstrating EF of 36%, although it described as visually 25-30. MRI scanning 5/18 EF 36%.  His heart rates remain in the high 30s. He has had no problems with exercise tolerance and he notes that his heart rate was in the 90s with exertion.  A week ago last Friday he had a protracted episode of chest tightness. It was similar to the tightness that he had in December also the episode in December was accompanied by a severe pain. There was no associated lightheadedness. His blood pressure and heart rate were within his normal range. He didn't feel quite right. He ended up taking nitroglycerin with resolution of his discomfort. He has been working vigorously in the ER the last 12 days without untowards.   With the introduction of low-dose spironolactone, he has had no lightheadedness.   Date Cr K Mg  4/18  0.79 4.5             Past Medical History:  Diagnosis Date  . Arthritis    joints  . Cancer ([place])    skin/CLL. on head years ago  . Diverticulitis   . Dysrhythmia    bradycardia  . GERD (gastroesophageal reflux disease)   . H/O Bell's palsy   . HOH (hard of  hearing)   . Kidney calculi    stones from years ago  . LBBB (left bundle branch block)     Past Surgical History:  Procedure Laterality Date  . BACK SURGERY  1983   no metal  . CARDIAC CATHETERIZATION N/A 07/29/2014   Procedure: Left Heart Cath;  Surgeon: Teodoro Spray, MD;  Location: [place] [facility];  Service: Cardiovascular;  Laterality: N/A;  . CARDIAC CATHETERIZATION N/A 01/27/2016   Procedure: Left Heart Cath and Coronary Angiography;  Surgeon: [name], MD;  Location: [place] [facility];  Service: Cardiovascular;  Laterality: N/A;  . CARDIAC CATHETERIZATION N/A 01/27/2016   Procedure: Coronary Stent Intervention;  Surgeon: [name], MD;  Location: [place] [facility];  Service: Cardiovascular;  Laterality: N/A;  . CARDIAC CATHETERIZATION N/A 02/13/2016   Procedure: Coronary Stent Intervention;  Surgeon: [name], MD;  Location: [place] [facility];  Service: Cardiovascular;  Laterality: N/A;  . CARDIAC CATHETERIZATION N/A 02/13/2016   Procedure: Left Heart Cath and Coronary Angiography;  Surgeon: [name], MD;  Location: [place] [facility];  Service: Cardiovascular;  Laterality: N/A;  . CATARACT EXTRACTION W/PHACO Right 07/18/2014   Procedure: CATARACT EXTRACTION PHACO AND INTRAOCULAR LENS PLACEMENT (IOC);  Surgeon: Estill Cotta, MD;  Location: [facility];  Service: Ophthalmology;  Laterality: Right;  [place] 03:11 AP% 28.4 CDE 75.64  .  CATARACT EXTRACTION W/PHACO Left 06/19/2015   Procedure: CATARACT EXTRACTION PHACO AND INTRAOCULAR LENS PLACEMENT (IOC);  Surgeon: Estill Cotta, MD;  Location: [facility];  Service: Ophthalmology;  Laterality: Left;  [place] 02:36.5 AP% 27.7 CDE 69.51 fluid pack lot # [id number]  . HERNIA REPAIR Left 2010   inguinal hernia    Current Outpatient Prescriptions  Medication Sig Dispense Refill  . aspirin 81 MG tablet Take 81 mg by mouth every evening.     Marland Kitchen atorvastatin (LIPITOR) 80 MG tablet Take 1 tablet (80 mg total) by mouth every evening. 30  tablet 3  . carboxymethylcellulose (REFRESH TEARS) 0.5 % SOLN Place 1 drop into both eyes as needed (for dry eyes).    . carvedilol (COREG) 3.125 MG tablet Take 1 tablet (3.125 mg total) by mouth 2 (two) times daily. 60 tablet 3  . co-enzyme Q-10 30 MG capsule Take 30 mg by mouth daily.    Marland Kitchen lisinopril (PRINIVIL,ZESTRIL) 5 MG tablet Take 1 tablet (5 mg total) by mouth daily. 90 tablet 3  . nitroGLYCERIN (NITROSTAT) 0.4 MG SL tablet Place 1 tablet (0.4 mg total) under the tongue every 5 (five) minutes x 3 doses as needed for chest pain. 25 tablet 3  . omeprazole (PRILOSEC) 20 MG capsule Take 20 mg by mouth every evening.     Marland Kitchen spironolactone (ALDACTONE) 25 MG tablet Take 0.5 tablets (12.5 mg total) by mouth daily. 45 tablet 3  . ticagrelor (BRILINTA) 90 MG TABS tablet Take 1 tablet (90 mg total) by mouth 2 (two) times daily. 60 tablet 0   No current facility-administered medications for this visit.     Allergies  Allergen Reactions  . Niaspan [Niacin Er] Anaphylaxis and Other (See Comments)    Episode happened 10 to 15 years ago.  Has not tried any other similar meds since         Social History  . Marital status: Married    Spouse name: N/A  . Number of children: N/A  . Years of education: N/A   Occupational History  . Not on file.   Social History Main Topics  . Smoking status: Former Smoker    Years: 4.00    Types: Cigars  . Smokeless tobacco: Never Used  . Alcohol use No  . Drug use: No  . Sexual activity: Not on file   Other Topics Concern  . Not on file     Family History  Problem Relation Age of Onset  . Heart failure Father      ROS:  Please see the history of present illness.     All other systems reviewed and negative.    Physical Exam: Blood pressure (!) 112/52, pulse (!) 38, height 5\' 11"  (1.803 m), weight 183 lb (83 kg). Well developed and nourished in no acute distress HENT normal Neck supple with JVP-flat Clear Regular rate and rhythm, no  murmurs or gallops Abd-soft with active BS No Clubbing cyanosis edema Skin-warm and dry A & Oriented  Grossly normal sensory and motor function    Labs: Cardiac Enzymes No results for input(s): CKTOTAL, CKMB, TROPONINI in the last 72 hours. CBC Lab Results  Component Value Date   WBC 12.2 (H) 06/17/2016   HGB 12.0 (L) 06/17/2016   HCT 35.4 (L) 06/17/2016   MCV 95.5 06/17/2016   PLT 230 06/17/2016   PROTIME: No results for input(s): LABPROT, INR in the last 72 hours. Chemistry No results for input(s): NA, K, CL, CO2, BUN, CREATININE, CALCIUM, PROT,  BILITOT, ALKPHOS, ALT, AST, GLUCOSE in the last 168 hours.  Invalid input(s): LABALBU Lipids Lab Results  Component Value Date   CHOL 141 01/27/2016   HDL 32 (L) 01/27/2016   LDLCALC 103 (H) 01/27/2016   TRIG 32 01/27/2016   BNP No results found for: PROBNP Thyroid Function Tests: No results for input(s): TSH, T4TOTAL, T3FREE, THYROIDAB in the last 72 hours.  Invalid input(s): FREET3 Miscellaneous No results found for: DDIMER  Radiology/Studies:  No results found.  EKG:  Personally reviewed   Sinus  38  17/16/54  Axis left -45  LBBB   Assessment and Plan:  Sinus bradycardia with reasonable chronotropic competence  Ischemic cardiomyopathy  status post DES 2   12/17  EF 36% MRI 5/18   Congestive heart failure class II  Left bundle branch block  Hyperlipidemia   chest pain syndrome   His sinus bradycardia remains asymptomatic as best as I can tell. Hence, we will continue to observe and he is cautioned as is his family to look for episodes of lightheadedness.  I'm concerned about his chest pain syndrome. We have discussed the importance of coming to medical attention if it persists. We will check a troponin level although at day 12 I would expect them to be negative even if it had been cardiac in origin.  We've discussed the role of nitroglycerin  His last LDL was over 100. We will check it again  today.  With his cardiomyopathy and his bradycardia I am going to take the liberty of discontinuing his carvedilol. There is some debate as to the value of 3.125 mg, we will see if we can get him on low-dose Entresto by discontinuing his beta blocker as well as his lisinopril. I am not particularly sanguine given the problems he had of hypotension during cardiac rehabilitation but we will try. We will plan to recheck his metabolic profile in 2 weeks which will be done when he sees his PCP. We will check his metabolic profile today on Aldactone  More than 50% of 40 min was spent in counseling related to the above            Current medicines are reviewed at length with the patient today .  The patient does not   have concerns regarding medicines.

## 2016-08-02 LAB — LDL CHOLESTEROL, DIRECT: LDL DIRECT: 65 mg/dL (ref 0–99)

## 2016-08-13 DIAGNOSIS — Z0000 Encounter for general adult medical examination without abnormal findings: Secondary | ICD-10-CM | POA: Diagnosis not present

## 2016-08-13 DIAGNOSIS — E782 Mixed hyperlipidemia: Secondary | ICD-10-CM | POA: Diagnosis not present

## 2016-08-20 DIAGNOSIS — Z0000 Encounter for general adult medical examination without abnormal findings: Secondary | ICD-10-CM | POA: Diagnosis not present

## 2016-08-20 DIAGNOSIS — C9190 Lymphoid leukemia, unspecified not having achieved remission: Secondary | ICD-10-CM | POA: Diagnosis not present

## 2016-08-20 DIAGNOSIS — E538 Deficiency of other specified B group vitamins: Secondary | ICD-10-CM | POA: Insufficient documentation

## 2016-08-20 DIAGNOSIS — Z79899 Other long term (current) drug therapy: Secondary | ICD-10-CM | POA: Diagnosis not present

## 2016-08-20 DIAGNOSIS — E782 Mixed hyperlipidemia: Secondary | ICD-10-CM | POA: Diagnosis not present

## 2016-08-20 DIAGNOSIS — Z125 Encounter for screening for malignant neoplasm of prostate: Secondary | ICD-10-CM | POA: Diagnosis not present

## 2016-08-22 ENCOUNTER — Encounter: Payer: Self-pay | Admitting: Internal Medicine

## 2016-08-22 NOTE — Telephone Encounter (Signed)
Pt BP reading from today:   115/47 just a few minutes ago  123/55 was the highest (today)  95/42 lowest (this morning)  Pt started taking Entresto it a couple weeks ago

## 2016-08-22 NOTE — Telephone Encounter (Signed)
Pt calling stating he is not doing any better and is trying to prevent another heart attack   He's feeling a lot worst.  Please advise

## 2016-08-23 ENCOUNTER — Other Ambulatory Visit: Payer: Self-pay | Admitting: *Deleted

## 2016-08-23 MED ORDER — LISINOPRIL 5 MG PO TABS: 5.0000 mg | ORAL_TABLET | Freq: Every day | ORAL | Status: DC

## 2016-10-25 DIAGNOSIS — X32XXXA Exposure to sunlight, initial encounter: Secondary | ICD-10-CM | POA: Diagnosis not present

## 2016-10-25 DIAGNOSIS — D2272 Melanocytic nevi of left lower limb, including hip: Secondary | ICD-10-CM | POA: Diagnosis not present

## 2016-10-25 DIAGNOSIS — D225 Melanocytic nevi of trunk: Secondary | ICD-10-CM | POA: Diagnosis not present

## 2016-10-25 DIAGNOSIS — Z85828 Personal history of other malignant neoplasm of skin: Secondary | ICD-10-CM | POA: Diagnosis not present

## 2016-10-25 DIAGNOSIS — D2261 Melanocytic nevi of right upper limb, including shoulder: Secondary | ICD-10-CM | POA: Diagnosis not present

## 2016-10-25 DIAGNOSIS — L570 Actinic keratosis: Secondary | ICD-10-CM | POA: Diagnosis not present

## 2016-10-31 ENCOUNTER — Ambulatory Visit (INDEPENDENT_AMBULATORY_CARE_PROVIDER_SITE_OTHER): Payer: PPO | Admitting: Internal Medicine

## 2016-10-31 ENCOUNTER — Encounter: Payer: Self-pay | Admitting: Internal Medicine

## 2016-10-31 VITALS — BP 94/50 | HR 47 | Ht 71.0 in | Wt 179.5 lb

## 2016-10-31 DIAGNOSIS — I447 Left bundle-branch block, unspecified: Secondary | ICD-10-CM | POA: Diagnosis not present

## 2016-10-31 DIAGNOSIS — I509 Heart failure, unspecified: Secondary | ICD-10-CM | POA: Diagnosis not present

## 2016-10-31 DIAGNOSIS — R079 Chest pain, unspecified: Secondary | ICD-10-CM | POA: Diagnosis not present

## 2016-10-31 DIAGNOSIS — R001 Bradycardia, unspecified: Secondary | ICD-10-CM

## 2016-10-31 DIAGNOSIS — Z01812 Encounter for preprocedural laboratory examination: Secondary | ICD-10-CM

## 2016-10-31 DIAGNOSIS — I255 Ischemic cardiomyopathy: Secondary | ICD-10-CM

## 2016-10-31 NOTE — Progress Notes (Signed)
Patient Care Team: Rusty Aus, MD as PCP - General (Internal Medicine)   HPI  Christopher Daniels is a 82 y.o. male seen in followup of sinus bradycardia; left ventricular dysfunction in the setting of a recent MI. He also has LBBB  He has a history of ischemic heart disease.  03/01 STEMI complicated by complete heart block in the context of a known history of left bundle branch block; he underwent DES stenting of his "super dominant " RCA.  LVEF at that time 25-30% which he been noted previously.He has been on brilinta.   He was given a LifeVest.  He underwent staged intervention of his LAD 2 weeks later. Ejection fraction at that time was 30-35%.  He has been managed with very low-dose carvedilol as noted below and lisinopril. It also had to be down titrated during cardiac rehabilitation because of hypotension which was asymptomatic  Echocardiogram 3/18 demonstrating EF of 36%, although it described as visually 25-30. MRI scanning 5/18 EF 36%.  He has had heart rates  in the high 30s; at his last visit we stopped carvedilol  His HR has been more recently in the 50 and 60s  He has had increasing problems with chest pain, mid sternal described as pressure. No radiation  Not triggered by exertion and lasts about 20 min but qualitatively similar to chest pain prior to his MI 12/17  Some assoc sob but no dyspnea or diaphoresis     Date Cr K Hgb  4/18  0.79 4.5    6/18  1.0 4.5 13.7     Past Medical History:  Diagnosis Date  . Arthritis    joints  . Cancer ([place])    skin/CLL. on head years ago  . Diverticulitis   . Dysrhythmia    bradycardia  . GERD (gastroesophageal reflux disease)   . H/O Bell's palsy   . HOH (hard of hearing)   . Kidney calculi    stones from years ago  . LBBB (left bundle branch block)     Past Surgical History:  Procedure Laterality Date  . BACK SURGERY  1983   no metal  . CARDIAC CATHETERIZATION N/A 07/29/2014   Procedure: Left Heart Cath;   Surgeon: Teodoro Spray, MD;  Location: [place] [facility];  Service: Cardiovascular;  Laterality: N/A;  . CARDIAC CATHETERIZATION N/A 01/27/2016   Procedure: Left Heart Cath and Coronary Angiography;  Surgeon: [name], MD;  Location: [place] [facility];  Service: Cardiovascular;  Laterality: N/A;  . CARDIAC CATHETERIZATION N/A 01/27/2016   Procedure: Coronary Stent Intervention;  Surgeon: [name], MD;  Location: [place] [facility];  Service: Cardiovascular;  Laterality: N/A;  . CARDIAC CATHETERIZATION N/A 02/13/2016   Procedure: Coronary Stent Intervention;  Surgeon: [name], MD;  Location: [place] [facility];  Service: Cardiovascular;  Laterality: N/A;  . CARDIAC CATHETERIZATION N/A 02/13/2016   Procedure: Left Heart Cath and Coronary Angiography;  Surgeon: [name], MD;  Location: [place] [facility];  Service: Cardiovascular;  Laterality: N/A;  . CATARACT EXTRACTION W/PHACO Right 07/18/2014   Procedure: CATARACT EXTRACTION PHACO AND INTRAOCULAR LENS PLACEMENT (IOC);  Surgeon: Estill Cotta, MD;  Location: [facility];  Service: Ophthalmology;  Laterality: Right;  [place] 03:11 AP% 28.4 CDE 75.64  . CATARACT EXTRACTION W/PHACO Left 06/19/2015   Procedure: CATARACT EXTRACTION PHACO AND INTRAOCULAR LENS PLACEMENT (IOC);  Surgeon: Estill Cotta, MD;  Location: [facility];  Service: Ophthalmology;  Laterality: Left;  [place]  02:36.5 AP% 27.7 CDE 69.51 fluid pack lot # [id number]  . HERNIA REPAIR Left 2010   inguinal hernia    Current Outpatient Prescriptions  Medication Sig Dispense Refill  . aspirin 81 MG tablet Take 81 mg by mouth every evening.     Marland Kitchen atorvastatin (LIPITOR) 80 MG tablet Take 1 tablet (80 mg total) by mouth every evening. 30 tablet 3  . carboxymethylcellulose (REFRESH TEARS) 0.5 % SOLN Place 1 drop into both eyes as needed (for dry eyes).    Marland Kitchen co-enzyme Q-10 30 MG capsule Take 30 mg by mouth daily.    Marland Kitchen lisinopril (PRINIVIL,ZESTRIL) 5 MG tablet Take 1 tablet (5 mg total) by  mouth daily.    . nitroGLYCERIN (NITROSTAT) 0.4 MG SL tablet Place 1 tablet (0.4 mg total) under the tongue every 5 (five) minutes x 3 doses as needed for chest pain. 25 tablet 3  . omeprazole (PRILOSEC) 20 MG capsule Take 20 mg by mouth every evening.     . ticagrelor (BRILINTA) 90 MG TABS tablet Take 1 tablet (90 mg total) by mouth 2 (two) times daily. 60 tablet 0  . spironolactone (ALDACTONE) 25 MG tablet Take 0.5 tablets (12.5 mg total) by mouth daily. 45 tablet 3   No current facility-administered medications for this visit.     Allergies  Allergen Reactions  . Niaspan [Niacin Er] Anaphylaxis and Other (See Comments)    Episode happened 10 to 15 years ago.  Has not tried any other similar meds since         Social History  . Marital status: Married    Spouse name: N/A  . Number of children: N/A  . Years of education: N/A   Occupational History  . Not on file.   Social History Main Topics  . Smoking status: Former Smoker    Years: 4.00    Types: Cigars  . Smokeless tobacco: Never Used  . Alcohol use No  . Drug use: No  . Sexual activity: Not on file   Other Topics Concern  . Not on file     Family History  Problem Relation Age of Onset  . Heart failure Father      ROS:  Please see the history of present illness.     All other systems reviewed and negative.    Physical Exam: Blood pressure (!) 112/52, pulse (!) 38, height 5' 11"  (1.803 m), weight 183 lb (83 kg). Well developed and nourished in no acute distress HENT normal Neck supple with JVP-flat Carotids brisk and full without bruits Clear Slow Regular rate and rhythm, no murmurs or gallops Abd-soft with active BS without hepatomegaly No Clubbing cyanosis edema Skin-warm and dry A & Oriented  Grossly normal sensory and motor function    Labs: Cardiac Enzymes No results for input(s): CKTOTAL, CKMB, TROPONINI in the last 72 hours. CBC Lab Results  Component Value Date   WBC 12.2 (H) 06/17/2016    HGB 12.0 (L) 06/17/2016   HCT 35.4 (L) 06/17/2016   MCV 95.5 06/17/2016   PLT 230 06/17/2016   PROTIME: No results for input(s): LABPROT, INR in the last 72 hours. Chemistry No results for input(s): NA, K, CL, CO2, BUN, CREATININE, CALCIUM, PROT, BILITOT, ALKPHOS, ALT, AST, GLUCOSE in the last 168 hours.  Invalid input(s): LABALBU Lipids Lab Results  Component Value Date   CHOL 141 01/27/2016   HDL 32 (L) 01/27/2016   LDLCALC 103 (H) 01/27/2016   TRIG 32 01/27/2016   BNP No  results found for: PROBNP Thyroid Function Tests: No results for input(s): TSH, T4TOTAL, T3FREE, THYROIDAB in the last 72 hours.  Invalid input(s): FREET3 Miscellaneous No results found for: DDIMER  Radiology/Studies:  No results found.  EKG:  Personally reviewed   Sinus  38  17/16/54  Axis left -45  LBBB   Assessment and Plan:  Sinus bradycardia with reasonable chronotropic competence  Ischemic cardiomyopathy  status post DES 2   12/17  EF 36% MRI 5/18   Congestive heart failure class II  Left bundle branch block  Hyperlipidemia     His sinus bradycardia  Is not assoc with specific symptoms  His fatigue seems to correlate most with episodes of lower blood pressureove  His chest pain syndrome is concerning given its similarity to his pre MI pain, although then as now it is somewhat atypical in duration and triggers.    I have reviewed the case with Dr MA and will proceed with repeat cathaterization as the degree of residual coronary diseases at last PCI would make scanning hard and BP makes empiric antianginal therapy challenging  Dr MA has met the patient          Current medicines are reviewed at length with the patient today .  The patient does not   have concerns regarding medicines.

## 2016-10-31 NOTE — Patient Instructions (Addendum)
Medication Instructions: - Your physician recommends that you continue on your current medications as directed. Please refer to the Current Medication list given to you today.  Labwork: - Your physician recommends that you return for lab work - the week of 11/04/16- BMP/ CBC/ INR  Procedures/Testing: - Your physician has requested that you have a cardiac catheterization. Cardiac catheterization is used to diagnose and/or treat various heart conditions. Doctors may recommend this procedure for a number of different reasons. The most common reason is to evaluate chest pain. Chest pain can be a symptom of coronary artery disease (CAD), and cardiac catheterization can show whether plaque is narrowing or blocking your heart's arteries. This procedure is also used to evaluate the valves, as well as measure the blood flow and oxygen levels in different parts of your heart. For further information please visit [email or web address].  [facility] Cardiac Cath Instructions   You are scheduled for a Cardiac Cath on: Monday 11/18/16 with Dr. Fletcher Anon  Please arrive at 8:30 am on the day of your procedure  Please expect a call from our [place] to pre-register you  Do not eat/drink anything after midnight the night prior to your procedure  Someone will need to drive you home  It is recommended someone be with you for the first 24 hours after your procedure  Wear clothes that are easy to get on/off and wear slip on shoes if possible   Medications:  bring a current list of all medications with you  _x__ You may take all of your medications the morning of your procedure with enough water to swallow safely   Day of your procedure: Arrive at the [facility] entrance.  Free valet service is available.  After entering the [facility] please check-in at the registration desk (1st desk on your right) to receive your armband. After receiving your armband someone will escort you to the cardiac  cath/special procedures waiting area.  The usual length of stay after your procedure is about 2 to 3 hours.  This can vary.  If you have any questions, please call our office at [phone], or you may call the cardiac cath lab at [facility] directly at [phone]     Follow-Up: - Your physician recommends that you schedule a follow-up appointment in: about 4 weeks (from 9/24) with the PA/ NP - post cath follow up.   - Your physician wants you to follow-up in: 6 months with Dr. Caryl Comes. You will receive a reminder letter in the mail two months in advance. If you don't receive a letter, please call our office to schedule the follow-up appointment.   Any Additional Special Instructions Will Be Listed Below (If Applicable).     If you need a refill on your cardiac medications before your next appointment, please call your pharmacy.

## 2016-11-04 ENCOUNTER — Other Ambulatory Visit (INDEPENDENT_AMBULATORY_CARE_PROVIDER_SITE_OTHER): Payer: PPO | Admitting: *Deleted

## 2016-11-04 DIAGNOSIS — Z01812 Encounter for preprocedural laboratory examination: Secondary | ICD-10-CM | POA: Diagnosis not present

## 2016-11-04 DIAGNOSIS — R079 Chest pain, unspecified: Secondary | ICD-10-CM

## 2016-11-05 LAB — BASIC METABOLIC PANEL
BUN / CREAT RATIO: 11 (ref 10–24)
BUN: 11 mg/dL (ref 8–27)
CO2: 23 mmol/L (ref 20–29)
CREATININE: 0.98 mg/dL (ref 0.76–1.27)
Calcium: 8.7 mg/dL (ref 8.6–10.2)
Chloride: 106 mmol/L (ref 96–106)
GFR calc non Af Amer: 73 mL/min/{1.73_m2} (ref >59)
GFR, EST AFRICAN AMERICAN: 84 mL/min/{1.73_m2} (ref >59)
GLUCOSE: 104 mg/dL — AB (ref 65–99)
Potassium: 4.4 mmol/L (ref 3.5–5.2)
Sodium: 140 mmol/L (ref 134–144)

## 2016-11-05 LAB — PROTIME-INR
INR: 1.1 (ref 0.8–1.2)
Prothrombin Time: 11.1 s (ref 9.1–12.0)

## 2016-11-05 LAB — CBC WITH DIFFERENTIAL/PLATELET
BASOS: 0 %
Basophils Absolute: 0 10*3/uL (ref 0.0–0.2)
EOS (ABSOLUTE): 0.1 10*3/uL (ref 0.0–0.4)
Eos: 1 %
HEMOGLOBIN: 10.3 g/dL — AB (ref 13.0–17.7)
Hematocrit: 32.6 % — ABNORMAL LOW (ref 37.5–51.0)
IMMATURE GRANS (ABS): 0 10*3/uL (ref 0.0–0.1)
Immature Granulocytes: 0 %
LYMPHS: 67 %
Lymphocytes Absolute: 7.1 10*3/uL — ABNORMAL HIGH (ref 0.7–3.1)
MCH: 28.9 pg (ref 26.6–33.0)
MCHC: 31.6 g/dL (ref 31.5–35.7)
MCV: 92 fL (ref 79–97)
MONOCYTES: 5 %
Monocytes Absolute: 0.5 10*3/uL (ref 0.1–0.9)
NEUTROS ABS: 2.8 10*3/uL (ref 1.4–7.0)
Neutrophils: 27 %
Platelets: 236 10*3/uL (ref 150–379)
RBC: 3.56 x10E6/uL — ABNORMAL LOW (ref 4.14–5.80)
RDW: 15 % (ref 12.3–15.4)
WBC: 10.6 10*3/uL (ref 3.4–10.8)

## 2016-11-07 ENCOUNTER — Ambulatory Visit
Admission: RE | Admit: 2016-11-07 | Discharge: 2016-11-07 | Disposition: A | Discharge status: Home / Self Care | Payer: PPO | Source: Ambulatory Visit | Attending: Internal Medicine | Admitting: Internal Medicine

## 2016-11-07 ENCOUNTER — Other Ambulatory Visit: Payer: Self-pay | Admitting: Internal Medicine

## 2016-11-07 DIAGNOSIS — R079 Chest pain, unspecified: Secondary | ICD-10-CM

## 2016-11-07 DIAGNOSIS — D500 Iron deficiency anemia secondary to blood loss (chronic): Secondary | ICD-10-CM

## 2016-11-07 DIAGNOSIS — I255 Ischemic cardiomyopathy: Secondary | ICD-10-CM | POA: Diagnosis not present

## 2016-11-07 DIAGNOSIS — I708 Atherosclerosis of other arteries: Secondary | ICD-10-CM | POA: Diagnosis not present

## 2016-11-07 DIAGNOSIS — I2510 Atherosclerotic heart disease of native coronary artery without angina pectoris: Secondary | ICD-10-CM | POA: Insufficient documentation

## 2016-11-07 DIAGNOSIS — K760 Fatty (change of) liver, not elsewhere classified: Secondary | ICD-10-CM | POA: Diagnosis not present

## 2016-11-07 DIAGNOSIS — I700 Atherosclerosis of aorta: Secondary | ICD-10-CM | POA: Insufficient documentation

## 2016-11-07 DIAGNOSIS — K8020 Calculus of gallbladder without cholecystitis without obstruction: Secondary | ICD-10-CM | POA: Diagnosis not present

## 2016-11-07 DIAGNOSIS — Z9889 Other specified postprocedural states: Secondary | ICD-10-CM | POA: Insufficient documentation

## 2016-11-07 DIAGNOSIS — M4316 Spondylolisthesis, lumbar region: Secondary | ICD-10-CM | POA: Insufficient documentation

## 2016-11-07 DIAGNOSIS — R1084 Generalized abdominal pain: Secondary | ICD-10-CM | POA: Insufficient documentation

## 2016-11-07 DIAGNOSIS — K5730 Diverticulosis of large intestine without perforation or abscess without bleeding: Secondary | ICD-10-CM | POA: Insufficient documentation

## 2016-11-07 DIAGNOSIS — E538 Deficiency of other specified B group vitamins: Secondary | ICD-10-CM | POA: Diagnosis not present

## 2016-11-07 MED ORDER — IOPAMIDOL (ISOVUE-300) INJECTION 61%
100.0000 mL | Freq: Once | INTRAVENOUS | Status: AC | PRN
  Administered 2016-11-07: 100 mL via INTRAVENOUS

## 2016-11-11 DIAGNOSIS — D500 Iron deficiency anemia secondary to blood loss (chronic): Secondary | ICD-10-CM | POA: Diagnosis not present

## 2016-11-18 ENCOUNTER — Encounter: Payer: Self-pay | Admitting: *Deleted

## 2016-11-18 ENCOUNTER — Ambulatory Visit
Admission: RE | Admit: 2016-11-18 | Discharge: 2016-11-18 | Disposition: A | Discharge status: Home / Self Care | Payer: PPO | Source: Ambulatory Visit | Attending: Cardiovascular Disease | Admitting: Cardiovascular Disease

## 2016-11-18 ENCOUNTER — Encounter
Admission: RE | Disposition: A | Discharge status: Home / Self Care | Payer: Self-pay | Source: Ambulatory Visit | Attending: Cardiovascular Disease

## 2016-11-18 DIAGNOSIS — I509 Heart failure, unspecified: Secondary | ICD-10-CM | POA: Diagnosis not present

## 2016-11-18 DIAGNOSIS — K219 Gastro-esophageal reflux disease without esophagitis: Secondary | ICD-10-CM | POA: Insufficient documentation

## 2016-11-18 DIAGNOSIS — R079 Chest pain, unspecified: Secondary | ICD-10-CM | POA: Diagnosis present

## 2016-11-18 DIAGNOSIS — I252 Old myocardial infarction: Secondary | ICD-10-CM | POA: Diagnosis not present

## 2016-11-18 DIAGNOSIS — I255 Ischemic cardiomyopathy: Secondary | ICD-10-CM | POA: Insufficient documentation

## 2016-11-18 DIAGNOSIS — Z87891 Personal history of nicotine dependence: Secondary | ICD-10-CM | POA: Insufficient documentation

## 2016-11-18 DIAGNOSIS — Z7902 Long term (current) use of antithrombotics/antiplatelets: Secondary | ICD-10-CM | POA: Diagnosis not present

## 2016-11-18 DIAGNOSIS — E785 Hyperlipidemia, unspecified: Secondary | ICD-10-CM | POA: Insufficient documentation

## 2016-11-18 DIAGNOSIS — I447 Left bundle-branch block, unspecified: Secondary | ICD-10-CM | POA: Insufficient documentation

## 2016-11-18 DIAGNOSIS — M1990 Unspecified osteoarthritis, unspecified site: Secondary | ICD-10-CM | POA: Diagnosis not present

## 2016-11-18 DIAGNOSIS — Z955 Presence of coronary angioplasty implant and graft: Secondary | ICD-10-CM | POA: Diagnosis not present

## 2016-11-18 DIAGNOSIS — D649 Anemia, unspecified: Secondary | ICD-10-CM | POA: Insufficient documentation

## 2016-11-18 DIAGNOSIS — I2510 Atherosclerotic heart disease of native coronary artery without angina pectoris: Secondary | ICD-10-CM

## 2016-11-18 DIAGNOSIS — Z7982 Long term (current) use of aspirin: Secondary | ICD-10-CM | POA: Diagnosis not present

## 2016-11-18 DIAGNOSIS — I442 Atrioventricular block, complete: Secondary | ICD-10-CM | POA: Diagnosis not present

## 2016-11-18 HISTORY — PX: LEFT HEART CATH AND CORONARY ANGIOGRAPHY: CATH118249

## 2016-11-18 SURGERY — LEFT HEART CATH AND CORONARY ANGIOGRAPHY
Anesthesia: Moderate Sedation

## 2016-11-18 MED ORDER — VERAPAMIL HCL 2.5 MG/ML IV SOLN
INTRAVENOUS | Status: DC | PRN
  Administered 2016-11-18: 2.5 mg via INTRA_ARTERIAL

## 2016-11-18 MED ORDER — ASPIRIN 81 MG PO CHEW
81.0000 mg | CHEWABLE_TABLET | ORAL | Status: AC
  Administered 2016-11-18: 81 mg via ORAL

## 2016-11-18 MED ORDER — HEPARIN SODIUM (PORCINE) 1000 UNIT/ML IJ SOLN
INTRAMUSCULAR | Status: AC
  Filled 2016-11-18: qty 1

## 2016-11-18 MED ORDER — SODIUM CHLORIDE 0.9% FLUSH: 3.0000 mL | Freq: Two times a day (BID) | INTRAVENOUS | Status: DC

## 2016-11-18 MED ORDER — MIDAZOLAM HCL 2 MG/2ML IJ SOLN
INTRAMUSCULAR | Status: DC | PRN
  Administered 2016-11-18: 1 mg via INTRAVENOUS

## 2016-11-18 MED ORDER — HEPARIN SODIUM (PORCINE) 1000 UNIT/ML IJ SOLN
INTRAMUSCULAR | Status: DC | PRN
  Administered 2016-11-18: 4000 [IU] via INTRAVENOUS

## 2016-11-18 MED ORDER — SODIUM CHLORIDE 0.9 % IV SOLN
INTRAVENOUS | Status: DC
  Administered 2016-11-18: 09:00:00 via INTRAVENOUS

## 2016-11-18 MED ORDER — SODIUM CHLORIDE 0.9 % IV SOLN: INTRAVENOUS | Status: DC

## 2016-11-18 MED ORDER — IOPAMIDOL (ISOVUE-300) INJECTION 61%
INTRAVENOUS | Status: DC | PRN
  Administered 2016-11-18: 50 mL via INTRA_ARTERIAL

## 2016-11-18 MED ORDER — SODIUM CHLORIDE 0.9 % IV SOLN: 250.0000 mL | INTRAVENOUS | Status: DC | PRN

## 2016-11-18 MED ORDER — MIDAZOLAM HCL 2 MG/2ML IJ SOLN
INTRAMUSCULAR | Status: AC
  Filled 2016-11-18: qty 2

## 2016-11-18 MED ORDER — SODIUM CHLORIDE 0.9% FLUSH: 3.0000 mL | INTRAVENOUS | Status: DC | PRN

## 2016-11-18 MED ORDER — LIDOCAINE HCL (PF) 1 % IJ SOLN
INTRAMUSCULAR | Status: AC
  Filled 2016-11-18: qty 30

## 2016-11-18 MED ORDER — HEPARIN (PORCINE) IN NACL 2-0.9 UNIT/ML-% IJ SOLN
INTRAMUSCULAR | Status: AC
Start: 2016-11-18 — End: 2016-11-18
  Filled 2016-11-18: qty 500

## 2016-11-18 MED ORDER — ASPIRIN 81 MG PO CHEW
CHEWABLE_TABLET | ORAL | Status: AC
  Filled 2016-11-18: qty 1

## 2016-11-18 MED ORDER — FENTANYL CITRATE (PF) 100 MCG/2ML IJ SOLN
INTRAMUSCULAR | Status: AC
  Filled 2016-11-18: qty 2

## 2016-11-18 MED ORDER — FENTANYL CITRATE (PF) 100 MCG/2ML IJ SOLN
INTRAMUSCULAR | Status: DC | PRN
  Administered 2016-11-18: 25 ug via INTRAVENOUS

## 2016-11-18 MED ORDER — VERAPAMIL HCL 2.5 MG/ML IV SOLN
INTRAVENOUS | Status: AC
  Filled 2016-11-18: qty 2

## 2016-11-18 SURGICAL SUPPLY — 8 items
CATH INFINITI JR4 5F (CATHETERS) ×2 IMPLANT
CATH OPTITORQUE JACKY 4.0 5F (CATHETERS) ×2 IMPLANT
DEVICE RAD TR BAND REGULAR (VASCULAR PRODUCTS) ×2 IMPLANT
GLIDESHEATH SLEND SS 6F .021 (SHEATH) ×2 IMPLANT
KIT MANI 3VAL PERCEP (MISCELLANEOUS) ×2 IMPLANT
PACK CARDIAC CATH (CUSTOM PROCEDURE TRAY) ×2 IMPLANT
WIRE HITORQ VERSACORE ST 145CM (WIRE) ×2 IMPLANT
WIRE ROSEN-J .035X260CM (WIRE) ×2 IMPLANT

## 2016-11-18 NOTE — H&P (View-Only) (Signed)
Patient Care Team: Rusty Aus, MD as PCP - General (Internal Medicine)   HPI  Christopher Daniels is a 82 y.o. male seen in followup of sinus bradycardia; left ventricular dysfunction in the setting of a recent MI. He also has LBBB  He has a history of ischemic heart disease.  00/86 STEMI complicated by complete heart block in the context of a known history of left bundle branch block; he underwent DES stenting of his "super dominant " RCA.  LVEF at that time 25-30% which he been noted previously.He has been on brilinta.   He was given a LifeVest.  He underwent staged intervention of his LAD 2 weeks later. Ejection fraction at that time was 30-35%.  He has been managed with very low-dose carvedilol as noted below and lisinopril. It also had to be down titrated during cardiac rehabilitation because of hypotension which was asymptomatic  Echocardiogram 3/18 demonstrating EF of 36%, although it described as visually 25-30. MRI scanning 5/18 EF 36%.  He has had heart rates  in the high 30s; at his last visit we stopped carvedilol  His HR has been more recently in the 50 and 60s  He has had increasing problems with chest pain, mid sternal described as pressure. No radiation  Not triggered by exertion and lasts about 20 min but qualitatively similar to chest pain prior to his MI 12/17  Some assoc sob but no dyspnea or diaphoresis     Date Cr K Hgb  4/18  0.79 4.5    6/18  1.0 4.5 13.7     Past Medical History:  Diagnosis Date  . Arthritis    joints  . Cancer ([place])    skin/CLL. on head years ago  . Diverticulitis   . Dysrhythmia    bradycardia  . GERD (gastroesophageal reflux disease)   . H/O Bell's palsy   . HOH (hard of hearing)   . Kidney calculi    stones from years ago  . LBBB (left bundle branch block)     Past Surgical History:  Procedure Laterality Date  . BACK SURGERY  1983   no metal  . CARDIAC CATHETERIZATION N/A 07/29/2014   Procedure: Left Heart Cath;   Surgeon: Teodoro Spray, MD;  Location: [place] [facility];  Service: Cardiovascular;  Laterality: N/A;  . CARDIAC CATHETERIZATION N/A 01/27/2016   Procedure: Left Heart Cath and Coronary Angiography;  Surgeon: [name], MD;  Location: [place] [facility];  Service: Cardiovascular;  Laterality: N/A;  . CARDIAC CATHETERIZATION N/A 01/27/2016   Procedure: Coronary Stent Intervention;  Surgeon: [name], MD;  Location: [place] [facility];  Service: Cardiovascular;  Laterality: N/A;  . CARDIAC CATHETERIZATION N/A 02/13/2016   Procedure: Coronary Stent Intervention;  Surgeon: [name], MD;  Location: [place] [facility];  Service: Cardiovascular;  Laterality: N/A;  . CARDIAC CATHETERIZATION N/A 02/13/2016   Procedure: Left Heart Cath and Coronary Angiography;  Surgeon: [name], MD;  Location: [place] [facility];  Service: Cardiovascular;  Laterality: N/A;  . CATARACT EXTRACTION W/PHACO Right 07/18/2014   Procedure: CATARACT EXTRACTION PHACO AND INTRAOCULAR LENS PLACEMENT (IOC);  Surgeon: Estill Cotta, MD;  Location: [facility];  Service: Ophthalmology;  Laterality: Right;  [place] 03:11 AP% 28.4 CDE 75.64  . CATARACT EXTRACTION W/PHACO Left 06/19/2015   Procedure: CATARACT EXTRACTION PHACO AND INTRAOCULAR LENS PLACEMENT (IOC);  Surgeon: Estill Cotta, MD;  Location: [facility];  Service: Ophthalmology;  Laterality: Left;  [place]  02:36.5 AP% 27.7 CDE 69.51 fluid pack lot # [id number]  . HERNIA REPAIR Left 2010   inguinal hernia    Current Outpatient Prescriptions  Medication Sig Dispense Refill  . aspirin 81 MG tablet Take 81 mg by mouth every evening.     Marland Kitchen atorvastatin (LIPITOR) 80 MG tablet Take 1 tablet (80 mg total) by mouth every evening. 30 tablet 3  . carboxymethylcellulose (REFRESH TEARS) 0.5 % SOLN Place 1 drop into both eyes as needed (for dry eyes).    Marland Kitchen co-enzyme Q-10 30 MG capsule Take 30 mg by mouth daily.    Marland Kitchen lisinopril (PRINIVIL,ZESTRIL) 5 MG tablet Take 1 tablet (5 mg total) by  mouth daily.    . nitroGLYCERIN (NITROSTAT) 0.4 MG SL tablet Place 1 tablet (0.4 mg total) under the tongue every 5 (five) minutes x 3 doses as needed for chest pain. 25 tablet 3  . omeprazole (PRILOSEC) 20 MG capsule Take 20 mg by mouth every evening.     . ticagrelor (BRILINTA) 90 MG TABS tablet Take 1 tablet (90 mg total) by mouth 2 (two) times daily. 60 tablet 0  . spironolactone (ALDACTONE) 25 MG tablet Take 0.5 tablets (12.5 mg total) by mouth daily. 45 tablet 3   No current facility-administered medications for this visit.     Allergies  Allergen Reactions  . Niaspan [Niacin Er] Anaphylaxis and Other (See Comments)    Episode happened 10 to 15 years ago.  Has not tried any other similar meds since         Social History  . Marital status: Married    Spouse name: N/A  . Number of children: N/A  . Years of education: N/A   Occupational History  . Not on file.   Social History Main Topics  . Smoking status: Former Smoker    Years: 4.00    Types: Cigars  . Smokeless tobacco: Never Used  . Alcohol use No  . Drug use: No  . Sexual activity: Not on file   Other Topics Concern  . Not on file     Family History  Problem Relation Age of Onset  . Heart failure Father      ROS:  Please see the history of present illness.     All other systems reviewed and negative.    Physical Exam: Blood pressure (!) 112/52, pulse (!) 38, height 5' 11"  (1.803 m), weight 183 lb (83 kg). Well developed and nourished in no acute distress HENT normal Neck supple with JVP-flat Carotids brisk and full without bruits Clear Slow Regular rate and rhythm, no murmurs or gallops Abd-soft with active BS without hepatomegaly No Clubbing cyanosis edema Skin-warm and dry A & Oriented  Grossly normal sensory and motor function    Labs: Cardiac Enzymes No results for input(s): CKTOTAL, CKMB, TROPONINI in the last 72 hours. CBC Lab Results  Component Value Date   WBC 12.2 (H) 06/17/2016    HGB 12.0 (L) 06/17/2016   HCT 35.4 (L) 06/17/2016   MCV 95.5 06/17/2016   PLT 230 06/17/2016   PROTIME: No results for input(s): LABPROT, INR in the last 72 hours. Chemistry No results for input(s): NA, K, CL, CO2, BUN, CREATININE, CALCIUM, PROT, BILITOT, ALKPHOS, ALT, AST, GLUCOSE in the last 168 hours.  Invalid input(s): LABALBU Lipids Lab Results  Component Value Date   CHOL 141 01/27/2016   HDL 32 (L) 01/27/2016   LDLCALC 103 (H) 01/27/2016   TRIG 32 01/27/2016   BNP No  results found for: PROBNP Thyroid Function Tests: No results for input(s): TSH, T4TOTAL, T3FREE, THYROIDAB in the last 72 hours.  Invalid input(s): FREET3 Miscellaneous No results found for: DDIMER  Radiology/Studies:  No results found.  EKG:  Personally reviewed   Sinus  38  17/16/54  Axis left -45  LBBB   Assessment and Plan:  Sinus bradycardia with reasonable chronotropic competence  Ischemic cardiomyopathy  status post DES 2   12/17  EF 36% MRI 5/18   Congestive heart failure class II  Left bundle branch block  Hyperlipidemia     His sinus bradycardia  Is not assoc with specific symptoms  His fatigue seems to correlate most with episodes of lower blood pressureove  His chest pain syndrome is concerning given its similarity to his pre MI pain, although then as now it is somewhat atypical in duration and triggers.    I have reviewed the case with Dr MA and will proceed with repeat cathaterization as the degree of residual coronary diseases at last PCI would make scanning hard and BP makes empiric antianginal therapy challenging  Dr MA has met the patient          Current medicines are reviewed at length with the patient today .  The patient does not   have concerns regarding medicines.

## 2016-11-18 NOTE — Interval H&P Note (Signed)
History and Physical Interval Note:  11/18/2016 10:10 AM  Christopher Daniels  has presented today for surgery, with the diagnosis of Left Heart Cath  The various methods of treatment have been discussed with the patient and family. After consideration of risks, benefits and other options for treatment, the patient has consented to  Procedure(s): LEFT HEART CATH AND CORONARY ANGIOGRAPHY (N/A) as a surgical intervention .  The patient's history has been reviewed, patient examined, no change in status, stable for surgery.  I have reviewed the patient's chart and labs.  Questions were answered to the patient's satisfaction.     Kathlyn [place]

## 2016-11-18 NOTE — Progress Notes (Signed)
TR band removed 30 min. Post final deflation. Band removed. Site without hematoma, edema, erythema, drainage, ecchymosis. 2x2 and tegaderm to site. Pt. Denies any cardiac or subjective c/o. DC instructions reviewed extensively with pt. And son with verbalized understanding.

## 2016-11-18 NOTE — Progress Notes (Signed)
Dr. Fletcher Anon at bedside to speak with pt. And family re: results of cath. Right wrist without any complications.

## 2016-11-21 DIAGNOSIS — K2970 Gastritis, unspecified, without bleeding: Secondary | ICD-10-CM | POA: Diagnosis not present

## 2016-12-10 DIAGNOSIS — I255 Ischemic cardiomyopathy: Secondary | ICD-10-CM | POA: Diagnosis not present

## 2016-12-10 DIAGNOSIS — Z23 Encounter for immunization: Secondary | ICD-10-CM | POA: Diagnosis not present

## 2016-12-10 DIAGNOSIS — I447 Left bundle-branch block, unspecified: Secondary | ICD-10-CM | POA: Diagnosis not present

## 2016-12-10 DIAGNOSIS — I252 Old myocardial infarction: Secondary | ICD-10-CM | POA: Diagnosis not present

## 2016-12-10 DIAGNOSIS — K219 Gastro-esophageal reflux disease without esophagitis: Secondary | ICD-10-CM | POA: Diagnosis not present

## 2016-12-10 DIAGNOSIS — R933 Abnormal findings on diagnostic imaging of other parts of digestive tract: Secondary | ICD-10-CM | POA: Diagnosis not present

## 2016-12-10 DIAGNOSIS — D509 Iron deficiency anemia, unspecified: Secondary | ICD-10-CM | POA: Diagnosis not present

## 2016-12-17 ENCOUNTER — Ambulatory Visit (INDEPENDENT_AMBULATORY_CARE_PROVIDER_SITE_OTHER): Payer: PPO | Admitting: Nurse Practitioner

## 2016-12-17 ENCOUNTER — Inpatient Hospital Stay: Payer: PPO | Attending: Oncology

## 2016-12-17 ENCOUNTER — Encounter: Payer: Self-pay | Admitting: Nurse Practitioner

## 2016-12-17 VITALS — BP 98/54 | HR 48 | Ht 71.0 in | Wt 182.5 lb

## 2016-12-17 DIAGNOSIS — I2510 Atherosclerotic heart disease of native coronary artery without angina pectoris: Secondary | ICD-10-CM

## 2016-12-17 DIAGNOSIS — I5022 Chronic systolic (congestive) heart failure: Secondary | ICD-10-CM | POA: Diagnosis not present

## 2016-12-17 DIAGNOSIS — D500 Iron deficiency anemia secondary to blood loss (chronic): Secondary | ICD-10-CM | POA: Insufficient documentation

## 2016-12-17 DIAGNOSIS — K219 Gastro-esophageal reflux disease without esophagitis: Secondary | ICD-10-CM | POA: Diagnosis not present

## 2016-12-17 DIAGNOSIS — I255 Ischemic cardiomyopathy: Secondary | ICD-10-CM

## 2016-12-17 DIAGNOSIS — R933 Abnormal findings on diagnostic imaging of other parts of digestive tract: Secondary | ICD-10-CM | POA: Diagnosis not present

## 2016-12-17 DIAGNOSIS — R001 Bradycardia, unspecified: Secondary | ICD-10-CM

## 2016-12-17 DIAGNOSIS — C9110 Chronic lymphocytic leukemia of B-cell type not having achieved remission: Secondary | ICD-10-CM

## 2016-12-17 LAB — CBC WITH DIFFERENTIAL/PLATELET
BASOS ABS: 0 10*3/uL (ref 0–0.1)
Basophils Relative: 1 %
Eosinophils Absolute: 0.1 10*3/uL (ref 0–0.7)
Eosinophils Relative: 1 %
HEMATOCRIT: 34.9 % — AB (ref 40.0–52.0)
Hemoglobin: 11.5 g/dL — ABNORMAL LOW (ref 13.0–18.0)
LYMPHS PCT: 67 %
Lymphs Abs: 6.7 10*3/uL — ABNORMAL HIGH (ref 1.0–3.6)
MCH: 30.3 pg (ref 26.0–34.0)
MCHC: 33 g/dL (ref 32.0–36.0)
MCV: 92 fL (ref 80.0–100.0)
Monocytes Absolute: 0.5 10*3/uL (ref 0.2–1.0)
Monocytes Relative: 5 %
Neutro Abs: 2.5 10*3/uL (ref 1.4–6.5)
Neutrophils Relative %: 26 %
Platelets: 214 10*3/uL (ref 150–440)
RBC: 3.8 MIL/uL — AB (ref 4.40–5.90)
RDW: 17.3 % — ABNORMAL HIGH (ref 11.5–14.5)
WBC: 9.9 10*3/uL (ref 3.8–10.6)

## 2016-12-17 NOTE — Patient Instructions (Signed)
Medication Instructions:  Please continue your current medications  Labwork: None  Testing/Procedures: None  Follow-Up: 3 months with Dr. Caryl Comes  If you need a refill on your cardiac medications before your next appointment, please call your pharmacy.
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
## 2017-02-13 DIAGNOSIS — Z125 Encounter for screening for malignant neoplasm of prostate: Secondary | ICD-10-CM | POA: Diagnosis not present

## 2017-02-13 DIAGNOSIS — E538 Deficiency of other specified B group vitamins: Secondary | ICD-10-CM | POA: Diagnosis not present

## 2017-02-13 DIAGNOSIS — Z79899 Other long term (current) drug therapy: Secondary | ICD-10-CM | POA: Diagnosis not present

## 2017-02-13 DIAGNOSIS — E782 Mixed hyperlipidemia: Secondary | ICD-10-CM | POA: Diagnosis not present

## 2017-02-20 DIAGNOSIS — E538 Deficiency of other specified B group vitamins: Secondary | ICD-10-CM | POA: Diagnosis not present

## 2017-02-20 DIAGNOSIS — Z0000 Encounter for general adult medical examination without abnormal findings: Secondary | ICD-10-CM | POA: Diagnosis not present

## 2017-02-20 DIAGNOSIS — E782 Mixed hyperlipidemia: Secondary | ICD-10-CM | POA: Diagnosis not present

## 2017-02-20 DIAGNOSIS — C9190 Lymphoid leukemia, unspecified not having achieved remission: Secondary | ICD-10-CM | POA: Diagnosis not present

## 2017-02-20 DIAGNOSIS — D500 Iron deficiency anemia secondary to blood loss (chronic): Secondary | ICD-10-CM | POA: Diagnosis not present

## 2017-03-03 ENCOUNTER — Other Ambulatory Visit: Payer: Self-pay | Admitting: Internal Medicine

## 2017-03-03 NOTE — Telephone Encounter (Signed)
Please review for refill. Thanks!  

## 2017-03-03 NOTE — Telephone Encounter (Signed)
Brilinta refilled. 

## 2017-03-05 ENCOUNTER — Emergency Department: Payer: PPO

## 2017-03-05 ENCOUNTER — Other Ambulatory Visit: Payer: Self-pay

## 2017-03-05 ENCOUNTER — Encounter: Payer: Self-pay | Admitting: Emergency Medicine

## 2017-03-05 ENCOUNTER — Observation Stay
Admission: EM | Admit: 2017-03-05 | Discharge: 2017-03-06 | Payer: PPO | Attending: Internal Medicine | Admitting: Internal Medicine

## 2017-03-05 ENCOUNTER — Telehealth: Payer: Self-pay | Admitting: Cardiovascular Disease

## 2017-03-05 DIAGNOSIS — I2510 Atherosclerotic heart disease of native coronary artery without angina pectoris: Secondary | ICD-10-CM | POA: Insufficient documentation

## 2017-03-05 DIAGNOSIS — R001 Bradycardia, unspecified: Secondary | ICD-10-CM

## 2017-03-05 DIAGNOSIS — I5022 Chronic systolic (congestive) heart failure: Secondary | ICD-10-CM | POA: Insufficient documentation

## 2017-03-05 DIAGNOSIS — I441 Atrioventricular block, second degree: Secondary | ICD-10-CM | POA: Diagnosis not present

## 2017-03-05 DIAGNOSIS — I255 Ischemic cardiomyopathy: Secondary | ICD-10-CM | POA: Diagnosis not present

## 2017-03-05 DIAGNOSIS — I700 Atherosclerosis of aorta: Secondary | ICD-10-CM | POA: Diagnosis not present

## 2017-03-05 DIAGNOSIS — Z79899 Other long term (current) drug therapy: Secondary | ICD-10-CM | POA: Insufficient documentation

## 2017-03-05 DIAGNOSIS — C9111 Chronic lymphocytic leukemia of B-cell type in remission: Secondary | ICD-10-CM | POA: Diagnosis not present

## 2017-03-05 DIAGNOSIS — K219 Gastro-esophageal reflux disease without esophagitis: Secondary | ICD-10-CM | POA: Insufficient documentation

## 2017-03-05 DIAGNOSIS — R079 Chest pain, unspecified: Secondary | ICD-10-CM | POA: Diagnosis not present

## 2017-03-05 DIAGNOSIS — M1990 Unspecified osteoarthritis, unspecified site: Secondary | ICD-10-CM | POA: Diagnosis not present

## 2017-03-05 DIAGNOSIS — R0789 Other chest pain: Secondary | ICD-10-CM | POA: Diagnosis not present

## 2017-03-05 DIAGNOSIS — I495 Sick sinus syndrome: Secondary | ICD-10-CM | POA: Diagnosis not present

## 2017-03-05 DIAGNOSIS — Z888 Allergy status to other drugs, medicaments and biological substances status: Secondary | ICD-10-CM | POA: Diagnosis not present

## 2017-03-05 DIAGNOSIS — I471 Supraventricular tachycardia: Secondary | ICD-10-CM | POA: Diagnosis not present

## 2017-03-05 DIAGNOSIS — Z955 Presence of coronary angioplasty implant and graft: Secondary | ICD-10-CM | POA: Diagnosis not present

## 2017-03-05 DIAGNOSIS — I110 Hypertensive heart disease with heart failure: Secondary | ICD-10-CM | POA: Insufficient documentation

## 2017-03-05 DIAGNOSIS — Z87891 Personal history of nicotine dependence: Secondary | ICD-10-CM | POA: Insufficient documentation

## 2017-03-05 DIAGNOSIS — Z87442 Personal history of urinary calculi: Secondary | ICD-10-CM | POA: Diagnosis not present

## 2017-03-05 DIAGNOSIS — I447 Left bundle-branch block, unspecified: Secondary | ICD-10-CM | POA: Insufficient documentation

## 2017-03-05 DIAGNOSIS — C9110 Chronic lymphocytic leukemia of B-cell type not having achieved remission: Secondary | ICD-10-CM | POA: Diagnosis not present

## 2017-03-05 DIAGNOSIS — Z85828 Personal history of other malignant neoplasm of skin: Secondary | ICD-10-CM | POA: Insufficient documentation

## 2017-03-05 DIAGNOSIS — I252 Old myocardial infarction: Secondary | ICD-10-CM | POA: Insufficient documentation

## 2017-03-05 DIAGNOSIS — Z7982 Long term (current) use of aspirin: Secondary | ICD-10-CM | POA: Insufficient documentation

## 2017-03-05 HISTORY — DX: Unspecified systolic (congestive) heart failure: I50.20

## 2017-03-05 LAB — BASIC METABOLIC PANEL
ANION GAP: 6 (ref 5–15)
BUN: 12 mg/dL (ref 6–20)
CALCIUM: 9 mg/dL (ref 8.9–10.3)
CO2: 27 mmol/L (ref 22–32)
Chloride: 102 mmol/L (ref 101–111)
Creatinine, Ser: 1.12 mg/dL (ref 0.61–1.24)
Glucose, Bld: 142 mg/dL — ABNORMAL HIGH (ref 65–99)
Potassium: 3.9 mmol/L (ref 3.5–5.1)
SODIUM: 135 mmol/L (ref 135–145)

## 2017-03-05 LAB — CBC
HCT: 40.3 % (ref 40.0–52.0)
HEMOGLOBIN: 13.5 g/dL (ref 13.0–18.0)
MCH: 32.1 pg (ref 26.0–34.0)
MCHC: 33.6 g/dL (ref 32.0–36.0)
MCV: 95.6 fL (ref 80.0–100.0)
PLATELETS: 221 10*3/uL (ref 150–440)
RBC: 4.22 MIL/uL — AB (ref 4.40–5.90)
RDW: 15.4 % — ABNORMAL HIGH (ref 11.5–14.5)
WBC: 11.3 10*3/uL — AB (ref 3.8–10.6)

## 2017-03-05 LAB — TROPONIN I

## 2017-03-05 MED ORDER — TICAGRELOR 90 MG PO TABS
90.0000 mg | ORAL_TABLET | Freq: Two times a day (BID) | ORAL | Status: DC
  Administered 2017-03-05 – 2017-03-06 (×2): 90 mg via ORAL
  Filled 2017-03-05 (×2): qty 1

## 2017-03-05 MED ORDER — PANTOPRAZOLE SODIUM 40 MG PO TBEC
80.0000 mg | DELAYED_RELEASE_TABLET | Freq: Every day | ORAL | Status: DC
  Administered 2017-03-06: 80 mg via ORAL
  Filled 2017-03-05: qty 2

## 2017-03-05 MED ORDER — ACETAMINOPHEN 325 MG PO TABS: 650.0000 mg | ORAL_TABLET | ORAL | Status: DC | PRN

## 2017-03-05 MED ORDER — SPIRONOLACTONE 12.5 MG HALF TABLET
12.5000 mg | ORAL_TABLET | Freq: Every evening | ORAL | Status: DC
  Administered 2017-03-05: 12.5 mg via ORAL
  Filled 2017-03-05 (×3): qty 1

## 2017-03-05 MED ORDER — MORPHINE SULFATE (PF) 2 MG/ML IV SOLN: 2.0000 mg | INTRAVENOUS | Status: DC | PRN

## 2017-03-05 MED ORDER — SODIUM CHLORIDE 0.9% FLUSH
3.0000 mL | Freq: Two times a day (BID) | INTRAVENOUS | Status: DC
  Administered 2017-03-05 – 2017-03-06 (×2): 3 mL via INTRAVENOUS

## 2017-03-05 MED ORDER — VITAMIN B-12 1000 MCG PO TABS
2500.0000 ug | ORAL_TABLET | ORAL | Status: DC
  Administered 2017-03-06: 2500 ug via ORAL
  Filled 2017-03-05: qty 3

## 2017-03-05 MED ORDER — ASPIRIN EC 81 MG PO TBEC
81.0000 mg | DELAYED_RELEASE_TABLET | Freq: Every day | ORAL | Status: DC
  Administered 2017-03-05: 81 mg via ORAL
  Filled 2017-03-05: qty 1

## 2017-03-05 MED ORDER — ENOXAPARIN SODIUM 40 MG/0.4ML ~~LOC~~ SOLN
40.0000 mg | SUBCUTANEOUS | Status: DC
  Administered 2017-03-05: 40 mg via SUBCUTANEOUS
  Filled 2017-03-05: qty 0.4

## 2017-03-05 MED ORDER — ATORVASTATIN CALCIUM 20 MG PO TABS
80.0000 mg | ORAL_TABLET | Freq: Every evening | ORAL | Status: DC
  Administered 2017-03-05: 80 mg via ORAL
  Filled 2017-03-05: qty 4

## 2017-03-05 MED ORDER — ONDANSETRON HCL 4 MG/2ML IJ SOLN: 4.0000 mg | Freq: Four times a day (QID) | INTRAMUSCULAR | Status: DC | PRN

## 2017-03-05 MED ORDER — LISINOPRIL 5 MG PO TABS
5.0000 mg | ORAL_TABLET | Freq: Every day | ORAL | Status: DC
  Administered 2017-03-05: 5 mg via ORAL
  Filled 2017-03-05: qty 1

## 2017-03-05 MED ORDER — NITROGLYCERIN 0.4 MG SL SUBL: 0.4000 mg | SUBLINGUAL_TABLET | SUBLINGUAL | Status: DC | PRN

## 2017-03-05 MED ORDER — NITROGLYCERIN 0.4 MG SL SUBL
0.4000 mg | SUBLINGUAL_TABLET | SUBLINGUAL | Status: DC | PRN
  Administered 2017-03-05: 0.4 mg via SUBLINGUAL
  Filled 2017-03-05: qty 1

## 2017-03-05 MED ORDER — POLYVINYL ALCOHOL 1.4 % OP SOLN
1.0000 [drp] | Freq: Three times a day (TID) | OPHTHALMIC | Status: DC | PRN
  Filled 2017-03-05: qty 15

## 2017-03-05 MED ORDER — ASPIRIN 81 MG PO CHEW
324.0000 mg | CHEWABLE_TABLET | Freq: Once | ORAL | Status: AC
  Administered 2017-03-05: 324 mg via ORAL
  Filled 2017-03-05: qty 4

## 2017-03-05 NOTE — H&P (Signed)
[facility] [place] NAME: Christopher Daniels    MR#:  [id number]  DATE OF BIRTH:  29-Aug-1934  DATE OF ADMISSION:  03/05/2017  PRIMARY CARE PHYSICIAN: Rusty Aus, MD   REQUESTING/REFERRING PHYSICIAN: Carrie Mew, MD  CHIEF COMPLAINT:   Chest pain HISTORY OF PRESENT ILLNESS:  Christopher Daniels  is a 83 y.o. male with a known history of coronary artery disease status post STEMI in  December 2017, status post 2 stents placement is presenting to the ED with a chief complaint of chest pain which woke him up from sleep at 4 AM today.  Patient was diaphoretic and also tachycardic which is unusual to him.  Patient usually is bradycardic and heart rate usually runs around 45-50.  Patient  came into the ED and was given sublingual nitroglycerin which resolved his pain.  Patient had a cardiac catheter done in September 2018 by Dr. Fletcher Anon and medical management was advised at the time ,resting comfortably during my examination.  Denies any other complaints.  2 daughters at bedside.  PAST MEDICAL HISTORY:   Past Medical History:  Diagnosis Date  . Arthritis    joints  . CAD (coronary artery disease)    a. 01/2016 Inf STEMI->PCI/Thrombectomy/DES to mid RCA (4.0 x 30 Resolute DES), EF 25-30%; b. 01/2016 Staged PCI/DES to mLAD (3.0x22 Resolute DES); c. 10/2016 Cath: LM nl, LAD patent stent, 30d, D2 70, LCX nl, OM1/2 nl, RCA patent mid/dist stent.  . Cancer ([place])    skin/CLL. on head years ago  . Diverticulitis   . GERD (gastroesophageal reflux disease)   . H/O Bell's palsy   . HOH (hard of hearing)   . Ischemic cardiomyopathy    a. 01/2016 EF 25-30%; b. 04/2016 f/u echo: EF 36%, mild glob HK, basal and mid inflat, inf, infsept, mid apical inf AK, mildly dil LA/RA, mild AI/MR, trace TR; c. 05/2016 Cardiac MRI: EF 36%, apical AK, mod diff HK, septal-lateral dyssynchrony  . Kidney calculi    stones from years ago  . LBBB (left bundle branch block)   .  Sinus bradycardia    a. Rates freq in the 40's - asymptomatic.    PAST SURGICAL HISTOIRY:   Past Surgical History:  Procedure Laterality Date  . BACK SURGERY  1983   no metal  . CARDIAC CATHETERIZATION N/A 07/29/2014   Procedure: Left Heart Cath;  Surgeon: Teodoro Spray, MD;  Location: [facility];  Service: Cardiovascular;  Laterality: N/A;  . CARDIAC CATHETERIZATION N/A 01/27/2016   Procedure: Left Heart Cath and Coronary Angiography;  Surgeon: [name], MD;  Location: [facility];  Service: Cardiovascular;  Laterality: N/A;  . CARDIAC CATHETERIZATION N/A 01/27/2016   Procedure: Coronary Stent Intervention;  Surgeon: [name], MD;  Location: [facility];  Service: Cardiovascular;  Laterality: N/A;  . CARDIAC CATHETERIZATION N/A 02/13/2016   Procedure: Coronary Stent Intervention;  Surgeon: [name], MD;  Location: [facility];  Service: Cardiovascular;  Laterality: N/A;  . CARDIAC CATHETERIZATION N/A 02/13/2016   Procedure: Left Heart Cath and Coronary Angiography;  Surgeon: [name], MD;  Location: [facility];  Service: Cardiovascular;  Laterality: N/A;  . CATARACT EXTRACTION W/PHACO Right 07/18/2014   Procedure: CATARACT EXTRACTION PHACO AND INTRAOCULAR LENS PLACEMENT (IOC);  Surgeon: Estill Cotta, MD;  Location: [facility];  Service: Ophthalmology;  Laterality: Right;  [place] 03:11 AP% 28.4 CDE 75.64  . CATARACT EXTRACTION W/PHACO Left  06/19/2015   Procedure: CATARACT EXTRACTION PHACO AND INTRAOCULAR LENS PLACEMENT (IOC);  Surgeon: Estill Cotta, MD;  Location: [facility];  Service: Ophthalmology;  Laterality: Left;  [place] 02:36.5 AP% 27.7 CDE 69.51 fluid pack lot # [id number]  . HERNIA REPAIR Left 2010   inguinal hernia  . LEFT HEART CATH AND CORONARY ANGIOGRAPHY N/A 11/18/2016   Procedure: LEFT HEART CATH AND CORONARY ANGIOGRAPHY;  Surgeon: Wellington Hampshire, MD;  Location: [facility];  Service: Cardiovascular;  Laterality: N/A;     SOCIAL HISTORY:   Social History   Tobacco Use  . Smoking status: Former Smoker    Years: 4.00    Types: Pipe  . Smokeless tobacco: Never Used  . Tobacco comment: qiit 1960  Substance Use Topics  . Alcohol use: No    FAMILY HISTORY:   Family History  Problem Relation Age of Onset  . Heart failure Father     DRUG ALLERGIES:   Allergies  Allergen Reactions  . Niaspan [Niacin Er] Anaphylaxis and Other (See Comments)    Episode happened 10 to 15 years ago.  Has not tried any other similar meds since    REVIEW OF SYSTEMS:  CONSTITUTIONAL: No fever, fatigue or weakness.  EYES: No blurred or double vision.  EARS, NOSE, AND THROAT: No tinnitus or ear pain.  RESPIRATORY: No cough, shortness of breath, wheezing or hemoptysis.  CARDIOVASCULAR: No chest pain, orthopnea, edema.  GASTROINTESTINAL: No nausea, vomiting, diarrhea or abdominal pain.  GENITOURINARY: No dysuria, hematuria.  ENDOCRINE: No polyuria, nocturia,  HEMATOLOGY: No anemia, easy bruising or bleeding SKIN: No rash or lesion. MUSCULOSKELETAL: No joint pain or arthritis.   NEUROLOGIC: No tingling, numbness, weakness.  PSYCHIATRY: No anxiety or depression.   MEDICATIONS AT HOME:   Prior to Admission medications   Medication Sig Start Date End Date Taking? Authorizing Provider  carboxymethylcellulose (REFRESH TEARS) 0.5 % SOLN Place 1 drop into both eyes 3 (three) times daily as needed (for dry eyes).    Yes [provider]  nitroGLYCERIN (NITROSTAT) 0.4 MG SL tablet Place 1 tablet (0.4 mg total) under the tongue every 5 (five) minutes x 3 doses as needed for chest pain. 01/29/16  Yes [name], MD  aspirin 81 MG tablet Take 81 mg by mouth at bedtime.     [provider]  atorvastatin (LIPITOR) 80 MG tablet Take 1 tablet (80 mg total) by mouth every evening. 01/29/16   [name], MD  co-enzyme Q-10 30 MG capsule Take 30 mg by mouth daily.    [provider]  Cyanocobalamin (B-12)  2500 MCG TABS Take 2,500 mcg by mouth every other day.    [provider]  Ferrous Gluconate-C-Folic Acid (IRON-C PO) Take 2 tablets by mouth daily.     [provider]  lisinopril (PRINIVIL,ZESTRIL) 5 MG tablet Take 1 tablet (5 mg total) by mouth daily. Patient taking differently: Take 5 mg by mouth at bedtime.  08/23/16 12/17/16  Deboraha Sprang, MD  pantoprazole (PROTONIX) 40 MG tablet Take 80 mg by mouth daily.     [provider]  spironolactone (ALDACTONE) 25 MG tablet Take 0.5 tablets (12.5 mg total) by mouth daily. 06/06/16 12/17/16  Deboraha Sprang, MD  ticagrelor (BRILINTA) 90 MG TABS tablet Take 1 tablet (90 mg total) by mouth 2 (two) times daily. Please keep upcoming appointment for further refills. 03/03/17   Deboraha Sprang, MD      VITAL SIGNS:  Blood pressure 119/69, pulse Marland Kitchen)  52, temperature 98.4 F (36.9 C), temperature source Oral, resp. rate 16, height 5\' 11"  (1.803 m), weight 81.6 kg (180 lb), SpO2 98 %.  PHYSICAL EXAMINATION:  GENERAL:  83 y.o.-year-old patient lying in the bed with no acute distress.  EYES: Pupils equal, round, reactive to light and accommodation. No scleral icterus. Extraocular muscles intact.  HEENT: Head atraumatic, normocephalic. Oropharynx and nasopharynx clear.  NECK:  Supple, no jugular venous distention. No thyroid enlargement, no tenderness.  LUNGS: Normal breath sounds bilaterally, no wheezing, rales,rhonchi or crepitation. No use of accessory muscles of respiration.  CARDIOVASCULAR: S1, S2 normal. No murmurs, rubs, or gallops.  ABDOMEN: Soft, nontender, nondistended. Bowel sounds present. No organomegaly or mass.  EXTREMITIES: No pedal edema, cyanosis, or clubbing.  NEUROLOGIC: Cranial nerves II through XII are intact. Muscle strength 5/5 in all extremities. Sensation intact. Gait not checked.  PSYCHIATRIC: The patient is alert and oriented x 3.  SKIN: No obvious rash, lesion, or ulcer.   LABORATORY PANEL:    CBC Recent Labs  Lab 03/05/17 1430  WBC 11.3*  HGB 13.5  HCT 40.3  PLT 221   ------------------------------------------------------------------------------------------------------------------  Chemistries  Recent Labs  Lab 03/05/17 1430  NA 135  K 3.9  CL 102  CO2 27  GLUCOSE 142*  BUN 12  CREATININE 1.12  CALCIUM 9.0   ------------------------------------------------------------------------------------------------------------------  Cardiac Enzymes Recent Labs  Lab 03/05/17 1430  TROPONINI <0.03   ------------------------------------------------------------------------------------------------------------------  RADIOLOGY:  Dg Chest 2 View  Result Date: 03/05/2017 CLINICAL DATA:  Chest pressure beginning this morning. History of myocardial infarction. EXAM: CHEST  2 VIEW COMPARISON:  None. FINDINGS: Heart size is normal with left ventricular prominence. Coronary artery stents are present. There is aortic atherosclerosis. Pulmonary vascularity is normal. There are a few small pulmonary scars. No infiltrate, collapse or effusion. IMPRESSION: Active disease. Left ventricular prominence. Coronary artery stents. Aortic atherosclerosis. Few linear pulmonary scars. Electronically Signed   By: Nelson Chimes M.D.   On: 03/05/2017 15:02    EKG:   Orders placed or performed during the hospital encounter of 03/05/17  . EKG 12-Lead  . EKG 12-Lead  . ED EKG within 10 minutes  . ED EKG within 10 minutes    IMPRESSION AND PLAN:  Christopher Daniels  is a 83 y.o. male with a known history of coronary artery disease status post STEMI in  December 2017, status post 2 stents placement is presenting to the ED with a chief complaint of chest pain which woke him up from sleep at 4 AM today.  Patient was diaphoretic and also tachycardic which is unusual to him.  Patient usually is bradycardic and heart rate usually runs around 45-50.  Patient  came into the ED and was given sublingual  nitroglycerin which resolved his pain.  Patient had a cardiac catheter done in September 2018 by Dr. Fletcher Anon    #Chest pain with tachycardia with history of acute MI status post 2 stents in year 2017 Admit to telemetry  cycle cardiac biomarkers Patient recently had a cardiac cath in September 2018 by Dr. Fletcher Anon no interventions done at the time, but recommended medical management Cardiology consult placed to [facility]  # history of coronary artery disease status post STEMI, 2 stents placement in 09/2015 Continue home medications aspirin, Brilinta, lisinopril, Lipitor  #Chronic asymptomatic bradycardia Patient is not on any beta-blocker or rate limiting drugs Continue close monitoring on telemetry  #History of cardiomyopathy Continue spironolactone, aspirin, Brilinta, lisinopril and Lipitor  #Chronic lymphocytic leukemia Currently under  remission follow-up with Dr. Grayland Ormond as recommended  #GERD-Protonix   Provide GI and DVT prophylaxis   All the records are reviewed and case discussed with ED provider. Management plans discussed with the patient, family and they are in agreement.  CODE STATUS: fc ,daughter Livingston Diones  TOTAL TIME TAKING CARE OF THIS PATIENT: 45  minutes.   Note: This dictation was prepared with Dragon dictation along with smaller phrase technology. Any transcriptional errors that result from this process are unintentional.  Nicholes Mango M.D on 03/05/2017 at 4:30 PM  Between 7am to 6pm - Pager - [phone]  After 6pm go to [email or web address] - password [email or web address] [facility]  Office  [phone]  CC: Primary care physician; Rusty Aus, MD

## 2017-03-05 NOTE — Plan of Care (Signed)
  Progressing Education: Knowledge of General Education information will improve 03/05/2017 2317 - Progressing by Loran Senters, RN Pain Managment: General experience of comfort will improve 03/05/2017 2317 - Progressing by Loran Senters, RN Safety: Ability to remain free from injury will improve 03/05/2017 2317 - Progressing by Loran Senters, RN Activity: Ability to tolerate increased activity will improve 03/05/2017 2317 - Progressing by Loran Senters, RN Cardiac: Ability to achieve and maintain adequate cardiovascular perfusion will improve 03/05/2017 2317 - Progressing by Loran Senters, RN

## 2017-03-05 NOTE — Telephone Encounter (Signed)
Pt c/o of Chest Pain: STAT if CP now or developed within 24 hours  1. Are you having CP right now? Yes   2. Are you experiencing any other symptoms (ex. SOB, nausea, vomiting, sweating)?  No   3. How long have you been experiencing CP? This morning   4. Is your CP continuous or coming and going? Comes and goes   5. Have you taken Nitroglycerin? Yes took this morning and it helped    ?

## 2017-03-05 NOTE — Telephone Encounter (Signed)
Received incoming call from patient. States he woke up this morning around 0400 with chest pain/pressure, sweating and jaw pain. BP was 112/55, HR 95.  Denies shortness of breath, nausea, vomiting or arm pain. States pain is "5" out of 10.  He took a Nitro earlier this morning and the pain subsided "for a little" but returned.   Advised him he should go immediately to the ER for evaluation and to not drive himself or call 911. Patient stated his daughter was there and could bring him. Patient is concerned because his wife is sick with exacerbation of her RA. Advised patient that he should go to the ER for evaluation. He verbalized understanding and said his wife can come with him.

## 2017-03-05 NOTE — ED Triage Notes (Signed)
Pt reports generalized chest tightness that began today at 0400. Pt reports associated right jaw pain but denies any other symptoms. Pt reports history of bradycardia, states his pulse is usually 38-44.

## 2017-03-05 NOTE — ED Provider Notes (Signed)
[facility] Emergency Department Provider Note  ____________________________________________  Time seen: Approximately 3:23 PM  I have reviewed the triage vital signs and the nursing notes.   HISTORY  Chief Complaint Chest Pain    HPI Christopher Daniels is a 83 y.o. male who complains of chest pain that woke him from sleep at 4 AM today. At onset it was severe, nonradiating but associated with heart racing and diaphoresis. He took a nitroglycerin which soon resolved the symptoms. Since then he's only had a residual tightness in the center of the chest that he describes as mild and nonradiating and not associated with shortness of breath vomiting or diaphoresis currently. He was able to leave the house and go shopping for a few hours without any new symptoms. He does have a history of STEMI and chronic bradycardia. He is on baby aspirin and Brillinta. He does note that between 2 and 5 days ago, he was off his [place] for 3 days due to pharmacy supply issues.     Past Medical History:  Diagnosis Date  . Arthritis    joints  . CAD (coronary artery disease)    a. 01/2016 Inf STEMI->PCI/Thrombectomy/DES to mid RCA (4.0 x 30 Resolute DES), EF 25-30%; b. 01/2016 Staged PCI/DES to mLAD (3.0x22 Resolute DES); c. 10/2016 Cath: LM nl, LAD patent stent, 30d, D2 70, LCX nl, OM1/2 nl, RCA patent mid/dist stent.  . Cancer ([place])    skin/CLL. on head years ago  . Diverticulitis   . GERD (gastroesophageal reflux disease)   . H/O Bell's palsy   . HOH (hard of hearing)   . Ischemic cardiomyopathy    a. 01/2016 EF 25-30%; b. 04/2016 f/u echo: EF 36%, mild glob HK, basal and mid inflat, inf, infsept, mid apical inf AK, mildly dil LA/RA, mild AI/MR, trace TR; c. 05/2016 Cardiac MRI: EF 36%, apical AK, mod diff HK, septal-lateral dyssynchrony  . Kidney calculi    stones from years ago  . LBBB (left bundle branch block)   . Sinus bradycardia    a. Rates freq in the 40's -  asymptomatic.     Patient Active Problem List   Diagnosis Date Noted  . Chest pain   . Acute MI, inferior wall, initial episode of care ([place]) 01/27/2016  . CAD (coronary artery disease), native coronary artery 01/27/2016  . Acute MI inferior lateral first episode care ([place]) 01/27/2016  . CLL (chronic lymphocytic leukemia) ([place]) 06/15/2015     Past Surgical History:  Procedure Laterality Date  . BACK SURGERY  1983   no metal  . CARDIAC CATHETERIZATION N/A 07/29/2014   Procedure: Left Heart Cath;  Surgeon: Teodoro Spray, MD;  Location: [place] [facility];  Service: Cardiovascular;  Laterality: N/A;  . CARDIAC CATHETERIZATION N/A 01/27/2016   Procedure: Left Heart Cath and Coronary Angiography;  Surgeon: [name], MD;  Location: [place] [facility];  Service: Cardiovascular;  Laterality: N/A;  . CARDIAC CATHETERIZATION N/A 01/27/2016   Procedure: Coronary Stent Intervention;  Surgeon: [name], MD;  Location: [place] [facility];  Service: Cardiovascular;  Laterality: N/A;  . CARDIAC CATHETERIZATION N/A 02/13/2016   Procedure: Coronary Stent Intervention;  Surgeon: [name], MD;  Location: [place] [facility];  Service: Cardiovascular;  Laterality: N/A;  . CARDIAC CATHETERIZATION N/A 02/13/2016   Procedure: Left Heart Cath and Coronary Angiography;  Surgeon: [name], MD;  Location: [place] [facility];  Service: Cardiovascular;  Laterality: N/A;  . CATARACT EXTRACTION W/PHACO  Right 07/18/2014   Procedure: CATARACT EXTRACTION PHACO AND INTRAOCULAR LENS PLACEMENT (IOC);  Surgeon: Estill Cotta, MD;  Location: [facility];  Service: Ophthalmology;  Laterality: Right;  [place] 03:11 AP% 28.4 CDE 75.64  . CATARACT EXTRACTION W/PHACO Left 06/19/2015   Procedure: CATARACT EXTRACTION PHACO AND INTRAOCULAR LENS PLACEMENT (IOC);  Surgeon: Estill Cotta, MD;  Location: [facility];  Service: Ophthalmology;  Laterality: Left;  [place] 02:36.5 AP% 27.7 CDE 69.51 fluid pack lot # [id number]  . HERNIA REPAIR  Left 2010   inguinal hernia  . LEFT HEART CATH AND CORONARY ANGIOGRAPHY N/A 11/18/2016   Procedure: LEFT HEART CATH AND CORONARY ANGIOGRAPHY;  Surgeon: Wellington Hampshire, MD;  Location: [place] [facility];  Service: Cardiovascular;  Laterality: N/A;     Prior to Admission medications   Medication Sig Start Date End Date Taking? Authorizing Provider  carboxymethylcellulose (REFRESH TEARS) 0.5 % SOLN Place 1 drop into both eyes 3 (three) times daily as needed (for dry eyes).    Yes [provider]  nitroGLYCERIN (NITROSTAT) 0.4 MG SL tablet Place 1 tablet (0.4 mg total) under the tongue every 5 (five) minutes x 3 doses as needed for chest pain. 01/29/16  Yes [name], MD  aspirin 81 MG tablet Take 81 mg by mouth at bedtime.     [provider]  atorvastatin (LIPITOR) 80 MG tablet Take 1 tablet (80 mg total) by mouth every evening. 01/29/16   [name], MD  co-enzyme Q-10 30 MG capsule Take 30 mg by mouth daily.    [provider]  Cyanocobalamin (B-12) 2500 MCG TABS Take 2,500 mcg by mouth every other day.    [provider]  Ferrous Gluconate-C-Folic Acid (IRON-C PO) Take 2 tablets by mouth daily.     [provider]  lisinopril (PRINIVIL,ZESTRIL) 5 MG tablet Take 1 tablet (5 mg total) by mouth daily. Patient taking differently: Take 5 mg by mouth at bedtime.  08/23/16 12/17/16  Deboraha Sprang, MD  pantoprazole (PROTONIX) 40 MG tablet Take 80 mg by mouth daily.     [provider]  spironolactone (ALDACTONE) 25 MG tablet Take 0.5 tablets (12.5 mg total) by mouth daily. 06/06/16 12/17/16  Deboraha Sprang, MD  ticagrelor (BRILINTA) 90 MG TABS tablet Take 1 tablet (90 mg total) by mouth 2 (two) times daily. Please keep upcoming appointment for further refills. 03/03/17   Deboraha Sprang, MD     Allergies Niaspan [niacin er]   Family History  Problem Relation Age of Onset  . Heart failure Father     Social History Social History    Tobacco Use  . Smoking status: Former Smoker    Years: 4.00    Types: Pipe  . Smokeless tobacco: Never Used  . Tobacco comment: qiit 1960  Substance Use Topics  . Alcohol use: No  . Drug use: No    Review of Systems  Constitutional:   No fever or chills.  ENT:   No sore throat. No rhinorrhea. Cardiovascular:   Positive as above chest pain without syncope. Respiratory:   No dyspnea . Recent nonproductive cough a week ago now resolved, lasted for 24 hours. Gastrointestinal:   Negative for abdominal pain, vomiting and diarrhea.  Musculoskeletal:   Negative for focal pain or swelling All other systems reviewed and are negative except as documented above in ROS and HPI.  ____________________________________________   PHYSICAL EXAM:  VITAL SIGNS: ED Triage Vitals  Enc Vitals Group     BP  03/05/17 1431 126/62     Pulse Rate 03/05/17 1431 (!) 40     Resp 03/05/17 1431 18     Temp 03/05/17 1431 98.4 F (36.9 C)     Temp Source 03/05/17 1431 Oral     SpO2 03/05/17 1431 100 %     Weight 03/05/17 1434 180 lb (81.6 kg)     Height 03/05/17 1434 5\' 11"  (1.803 m)     Head Circumference --      Peak Flow --      Pain Score 03/05/17 1434 5     Pain Loc --      Pain Edu? --      Excl. in [employer]? --     Vital signs reviewed, nursing assessments reviewed.   Constitutional:   Alert and oriented. Well appearing and in no distress. Eyes:   No scleral icterus.  EOMI. No nystagmus. No conjunctival pallor. PERRL. ENT   Head:   Normocephalic and atraumatic.   Nose:   No congestion/rhinnorhea.    Mouth/Throat:   MMM, no pharyngeal erythema. No peritonsillar mass.    Neck:   No meningismus. Full ROM. Hematological/Lymphatic/Immunilogical:   No cervical lymphadenopathy. Cardiovascular:   Bradycardia heart rate 50-55. Symmetric bilateral radial and DP pulses.  No murmurs.  Respiratory:   Normal respiratory effort without tachypnea/retractions. Breath sounds are clear and equal  bilaterally. No wheezes/rales/rhonchi. Gastrointestinal:   Soft and nontender. Non distended. There is no CVA tenderness.  No rebound, rigidity, or guarding. Genitourinary:   deferred Musculoskeletal:   Normal range of motion in all extremities. No joint effusions.  No lower extremity tenderness.  No edema. Neurologic:   Normal speech and language.  Motor grossly intact. No gross focal neurologic deficits are appreciated.  Skin:    Skin is warm, dry and intact. No rash noted.  No petechiae, purpura, or bullae.  ____________________________________________    LABS (pertinent positives/negatives) (all labs ordered are listed, but only abnormal results are displayed) Labs Reviewed  BASIC METABOLIC PANEL - Abnormal; Notable for the following components:      Result Value   Glucose, Bld 142 (*)    All other components within normal limits  CBC - Abnormal; Notable for the following components:   WBC 11.3 (*)    RBC 4.22 (*)    RDW 15.4 (*)    All other components within normal limits  TROPONIN I   ____________________________________________   EKG  Interpreted by me Sinus bradycardia, rate 42.  Normal axis. Left bundle branch block. No acute ischemic changes. No significant changes compared to prior Oct. 23 2018.  ____________________________________________    RADIOLOGY  Dg Chest 2 View  Result Date: 03/05/2017 CLINICAL DATA:  Chest pressure beginning this morning. History of myocardial infarction. EXAM: CHEST  2 VIEW COMPARISON:  None. FINDINGS: Heart size is normal with left ventricular prominence. Coronary artery stents are present. There is aortic atherosclerosis. Pulmonary vascularity is normal. There are a few small pulmonary scars. No infiltrate, collapse or effusion. IMPRESSION: Active disease. Left ventricular prominence. Coronary artery stents. Aortic atherosclerosis. Few linear pulmonary scars. Electronically Signed   By: Nelson Chimes M.D.   On: 03/05/2017 15:02     ____________________________________________   PROCEDURES Procedures  ____________________________________________    CLINICAL IMPRESSION / ASSESSMENT AND PLAN / ED COURSE  Pertinent labs & imaging results that were available during my care of the patient were reviewed by me and considered in my medical decision making (see chart for details).  Patient currently well-appearing no acute distress, presents with chest pain that is concerning for cardiac etiology. Presentation is not consistent with ACS PE or dissection at this time, but due to his elevated risk for ACS, known CAD and prior STEMI and being off his [place] for a few days, he warrants telemetry monitoring and serial troponins in the hospital. Case discussed with the hospitalist for further management. Medical decision making discussed with the patient, he agrees. Vital signs currently stable. He does have chronic bradycardia which is unchanged. Due to his residual tightness we'll do a trial of additional nitroglycerin, although I don't think continuous infusions are warranted and the patient does not warrant heparinization at this time with current initial troponin is negative.      ____________________________________________   FINAL CLINICAL IMPRESSION(S) / ED DIAGNOSES    Final diagnoses:  Chest pain with moderate risk for cardiac etiology  Chronic sinus bradycardia       Portions of this note were generated with dragon dictation software. Dictation errors may occur despite best attempts at proofreading.    Carrie Mew, MD 03/05/17 [phone]

## 2017-03-06 ENCOUNTER — Other Ambulatory Visit: Payer: Self-pay

## 2017-03-06 ENCOUNTER — Observation Stay (HOSPITAL_COMMUNITY)
Admission: AD | Admit: 2017-03-06 | Discharge: 2017-03-07 | Disposition: A | Discharge status: Home / Self Care | Payer: PPO | Source: Other Acute Inpatient Hospital | Attending: Internal Medicine | Admitting: Internal Medicine

## 2017-03-06 ENCOUNTER — Encounter (HOSPITAL_COMMUNITY): Payer: Self-pay | Admitting: General Practice

## 2017-03-06 ENCOUNTER — Encounter (HOSPITAL_COMMUNITY)
Admission: AD | Disposition: A | Discharge status: Home / Self Care | Payer: Self-pay | Source: Other Acute Inpatient Hospital | Attending: Internal Medicine

## 2017-03-06 ENCOUNTER — Encounter: Payer: Self-pay | Admitting: Nurse Practitioner

## 2017-03-06 DIAGNOSIS — I5022 Chronic systolic (congestive) heart failure: Secondary | ICD-10-CM | POA: Diagnosis not present

## 2017-03-06 DIAGNOSIS — I259 Chronic ischemic heart disease, unspecified: Secondary | ICD-10-CM | POA: Diagnosis not present

## 2017-03-06 DIAGNOSIS — K219 Gastro-esophageal reflux disease without esophagitis: Secondary | ICD-10-CM | POA: Insufficient documentation

## 2017-03-06 DIAGNOSIS — I2510 Atherosclerotic heart disease of native coronary artery without angina pectoris: Secondary | ICD-10-CM | POA: Diagnosis not present

## 2017-03-06 DIAGNOSIS — I447 Left bundle-branch block, unspecified: Secondary | ICD-10-CM

## 2017-03-06 DIAGNOSIS — I441 Atrioventricular block, second degree: Secondary | ICD-10-CM

## 2017-03-06 DIAGNOSIS — I255 Ischemic cardiomyopathy: Secondary | ICD-10-CM | POA: Diagnosis not present

## 2017-03-06 DIAGNOSIS — Z7982 Long term (current) use of aspirin: Secondary | ICD-10-CM | POA: Insufficient documentation

## 2017-03-06 DIAGNOSIS — Z955 Presence of coronary angioplasty implant and graft: Secondary | ICD-10-CM | POA: Insufficient documentation

## 2017-03-06 DIAGNOSIS — I252 Old myocardial infarction: Secondary | ICD-10-CM | POA: Insufficient documentation

## 2017-03-06 DIAGNOSIS — I495 Sick sinus syndrome: Secondary | ICD-10-CM | POA: Diagnosis not present

## 2017-03-06 DIAGNOSIS — I471 Supraventricular tachycardia: Secondary | ICD-10-CM

## 2017-03-06 DIAGNOSIS — Z87891 Personal history of nicotine dependence: Secondary | ICD-10-CM | POA: Diagnosis not present

## 2017-03-06 DIAGNOSIS — Z79899 Other long term (current) drug therapy: Secondary | ICD-10-CM | POA: Insufficient documentation

## 2017-03-06 DIAGNOSIS — Z950 Presence of cardiac pacemaker: Secondary | ICD-10-CM

## 2017-03-06 DIAGNOSIS — R079 Chest pain, unspecified: Secondary | ICD-10-CM | POA: Diagnosis not present

## 2017-03-06 DIAGNOSIS — I5023 Acute on chronic systolic (congestive) heart failure: Secondary | ICD-10-CM | POA: Insufficient documentation

## 2017-03-06 DIAGNOSIS — Z85828 Personal history of other malignant neoplasm of skin: Secondary | ICD-10-CM | POA: Diagnosis not present

## 2017-03-06 DIAGNOSIS — C9110 Chronic lymphocytic leukemia of B-cell type not having achieved remission: Secondary | ICD-10-CM | POA: Diagnosis not present

## 2017-03-06 HISTORY — DX: Chronic lymphocytic leukemia of B-cell type not having achieved remission: C91.10

## 2017-03-06 HISTORY — DX: Iron deficiency anemia, unspecified: D50.9

## 2017-03-06 HISTORY — DX: Atrioventricular block, second degree: I44.1

## 2017-03-06 HISTORY — PX: BI-VENTRICULAR PACEMAKER INSERTION (CRT-P): SHX5750

## 2017-03-06 HISTORY — DX: Pure hypercholesterolemia, unspecified: E78.00

## 2017-03-06 HISTORY — DX: Unspecified malignant neoplasm of skin, unspecified: C44.90

## 2017-03-06 HISTORY — DX: Acute myocardial infarction, unspecified: I21.9

## 2017-03-06 HISTORY — PX: BIV PACEMAKER INSERTION CRT-P: EP1199

## 2017-03-06 HISTORY — DX: Presence of cardiac pacemaker: Z95.0

## 2017-03-06 HISTORY — DX: Personal history of urinary calculi: Z87.442

## 2017-03-06 LAB — TROPONIN I: Troponin I: <0.03 ng/mL (ref <0.03)

## 2017-03-06 SURGERY — BIV PACEMAKER INSERTION CRT-P

## 2017-03-06 MED ORDER — CEFAZOLIN SODIUM-DEXTROSE 2-4 GM/100ML-% IV SOLN
2.0000 g | INTRAVENOUS | Status: DC
  Filled 2017-03-06: qty 100

## 2017-03-06 MED ORDER — LISINOPRIL 5 MG PO TABS
5.0000 mg | ORAL_TABLET | Freq: Every day | ORAL | Status: DC
  Administered 2017-03-06: 5 mg via ORAL
  Filled 2017-03-06: qty 1

## 2017-03-06 MED ORDER — SODIUM CHLORIDE 0.9 % IR SOLN
80.0000 mg | Status: DC
  Filled 2017-03-06: qty 2

## 2017-03-06 MED ORDER — LIDOCAINE HCL (PF) 1 % IJ SOLN
INTRAMUSCULAR | Status: DC | PRN
  Administered 2017-03-06: 60 mL

## 2017-03-06 MED ORDER — SPIRONOLACTONE 12.5 MG HALF TABLET
12.5000 mg | ORAL_TABLET | Freq: Every day | ORAL | Status: DC
  Administered 2017-03-06 – 2017-03-07 (×2): 12.5 mg via ORAL
  Filled 2017-03-06 (×2): qty 1

## 2017-03-06 MED ORDER — MIDAZOLAM HCL 5 MG/5ML IJ SOLN
INTRAMUSCULAR | Status: DC | PRN
  Administered 2017-03-06 (×2): 1 mg via INTRAVENOUS
  Administered 2017-03-06: 2 mg via INTRAVENOUS
  Administered 2017-03-06 (×2): 1 mg via INTRAVENOUS

## 2017-03-06 MED ORDER — FENTANYL CITRATE (PF) 100 MCG/2ML IJ SOLN
INTRAMUSCULAR | Status: AC
  Filled 2017-03-06: qty 2

## 2017-03-06 MED ORDER — SODIUM CHLORIDE 0.9 % IV SOLN
INTRAVENOUS | Status: AC | PRN
  Administered 2017-03-06: 50 mL/h via INTRAVENOUS

## 2017-03-06 MED ORDER — ONDANSETRON HCL 4 MG/2ML IJ SOLN: 4.0000 mg | Freq: Four times a day (QID) | INTRAMUSCULAR | Status: DC | PRN

## 2017-03-06 MED ORDER — TICAGRELOR 90 MG PO TABS
90.0000 mg | ORAL_TABLET | Freq: Two times a day (BID) | ORAL | Status: DC
  Administered 2017-03-06: 90 mg via ORAL
  Filled 2017-03-06: qty 1

## 2017-03-06 MED ORDER — HEPARIN (PORCINE) IN NACL 2-0.9 UNIT/ML-% IJ SOLN
INTRAMUSCULAR | Status: AC | PRN
  Administered 2017-03-06: 500 mL

## 2017-03-06 MED ORDER — CEFAZOLIN SODIUM-DEXTROSE 1-4 GM/50ML-% IV SOLN
1.0000 g | Freq: Four times a day (QID) | INTRAVENOUS | Status: AC
  Administered 2017-03-06 – 2017-03-07 (×3): 1 g via INTRAVENOUS
  Filled 2017-03-06 (×3): qty 50

## 2017-03-06 MED ORDER — ATORVASTATIN CALCIUM 80 MG PO TABS
80.0000 mg | ORAL_TABLET | Freq: Every evening | ORAL | Status: DC
  Administered 2017-03-06: 80 mg via ORAL
  Filled 2017-03-06: qty 1

## 2017-03-06 MED ORDER — HEPARIN (PORCINE) IN NACL 2-0.9 UNIT/ML-% IJ SOLN
INTRAMUSCULAR | Status: AC
  Filled 2017-03-06: qty 1000

## 2017-03-06 MED ORDER — CHLORHEXIDINE GLUCONATE 4 % EX LIQD
60.0000 mL | Freq: Once | CUTANEOUS | Status: DC
  Filled 2017-03-06: qty 60

## 2017-03-06 MED ORDER — SODIUM CHLORIDE 0.9 % IV SOLN: INTRAVENOUS | Status: DC

## 2017-03-06 MED ORDER — FENTANYL CITRATE (PF) 100 MCG/2ML IJ SOLN
INTRAMUSCULAR | Status: DC | PRN
  Administered 2017-03-06 (×3): 12.5 ug via INTRAVENOUS
  Administered 2017-03-06: 25 ug via INTRAVENOUS
  Administered 2017-03-06: 12.5 ug via INTRAVENOUS

## 2017-03-06 MED ORDER — ACETAMINOPHEN 325 MG PO TABS: 650.0000 mg | ORAL_TABLET | ORAL | Status: DC | PRN

## 2017-03-06 MED ORDER — CEFAZOLIN SODIUM-DEXTROSE 2-4 GM/100ML-% IV SOLN: 2.0000 g | INTRAVENOUS | Status: DC

## 2017-03-06 MED ORDER — IOPAMIDOL (ISOVUE-370) INJECTION 76%
INTRAVENOUS | Status: AC
  Filled 2017-03-06: qty 50

## 2017-03-06 MED ORDER — SODIUM CHLORIDE 0.9 % IR SOLN
80.0000 mg | Status: AC
  Administered 2017-03-06: 80 mg

## 2017-03-06 MED ORDER — NITROGLYCERIN 0.4 MG SL SUBL: 0.4000 mg | SUBLINGUAL_TABLET | SUBLINGUAL | Status: DC | PRN

## 2017-03-06 MED ORDER — PANTOPRAZOLE SODIUM 40 MG PO TBEC
40.0000 mg | DELAYED_RELEASE_TABLET | Freq: Two times a day (BID) | ORAL | Status: DC
  Administered 2017-03-06 – 2017-03-07 (×2): 40 mg via ORAL
  Filled 2017-03-06 (×2): qty 1

## 2017-03-06 MED ORDER — ACETAMINOPHEN 325 MG PO TABS
325.0000 mg | ORAL_TABLET | ORAL | Status: DC | PRN
  Administered 2017-03-06: 650 mg via ORAL

## 2017-03-06 MED ORDER — SODIUM CHLORIDE 0.9 % IR SOLN
Status: AC
  Filled 2017-03-06: qty 2

## 2017-03-06 MED ORDER — MIDAZOLAM HCL 5 MG/5ML IJ SOLN
INTRAMUSCULAR | Status: AC
  Filled 2017-03-06: qty 5

## 2017-03-06 MED ORDER — SODIUM CHLORIDE 0.9 % IR SOLN: 80.0000 mg | Status: DC

## 2017-03-06 MED ORDER — ACETAMINOPHEN 325 MG PO TABS
ORAL_TABLET | ORAL | Status: AC
  Filled 2017-03-06: qty 2

## 2017-03-06 MED ORDER — ASPIRIN EC 81 MG PO TBEC
81.0000 mg | DELAYED_RELEASE_TABLET | Freq: Every day | ORAL | Status: DC
  Administered 2017-03-06: 81 mg via ORAL
  Filled 2017-03-06: qty 1

## 2017-03-06 MED ORDER — CEFAZOLIN SODIUM-DEXTROSE 2-4 GM/100ML-% IV SOLN
INTRAVENOUS | Status: AC
  Filled 2017-03-06: qty 100

## 2017-03-06 MED ORDER — CEFAZOLIN SODIUM-DEXTROSE 2-4 GM/100ML-% IV SOLN
2.0000 g | INTRAVENOUS | Status: AC
  Administered 2017-03-06: 2 g via INTRAVENOUS

## 2017-03-06 SURGICAL SUPPLY — 19 items
ALLURE CRT PM3262 (Pacemaker) ×3 IMPLANT
CABLE SURGICAL S-101-97-12 (CABLE) ×3 IMPLANT
CATH CPS DIRECT 135 DS2C020 (CATHETERS) ×3 IMPLANT
CATH CPS QUART SUB DS2N027-59 (CATHETERS) ×3 IMPLANT
CATH HEX JOS 2-5-2 65CM 6F REP (CATHETERS) ×3 IMPLANT
CPS IMPLANT KIT 410190 (MISCELLANEOUS) ×3 IMPLANT
KIT ESSENTIALS PG (KITS) ×3 IMPLANT
LEAD QUARTET 1458QL-86 (Lead) ×1 IMPLANT
LEAD TENDRIL MRI 52CM LPA1200M (Lead) ×3 IMPLANT
LEAD TENDRIL MRI 58CM LPA1200M (Lead) ×3 IMPLANT
PACEMAKER ALLURE CRT (Pacemaker) ×1 IMPLANT
PAD DEFIB LIFELINK (PAD) ×3 IMPLANT
QUARTET 1458QL-86 (Lead) ×3 IMPLANT
SHEATH CLASSIC 8F (SHEATH) ×6 IMPLANT
SHEATH WORLEY 9FR 62CM (SHEATH) ×3 IMPLANT
SLITTER UNIVERSAL DS2A003 (MISCELLANEOUS) ×3 IMPLANT
TRAY PACEMAKER INSERTION (PACKS) ×3 IMPLANT
WIRE ACUITY WHISPER EDS 4648 (WIRE) ×3 IMPLANT
WIRE LUGE 182CM (WIRE) ×3 IMPLANT

## 2017-03-06 NOTE — H&P (Signed)
Cardiology Consult    Patient ID: HOUA ACKERT MRN: [id number], DOB/AGE: April 10, 1934   Admit date: 03/05/2017 Date of Consult: 03/06/2017  Primary Physician: Rusty Aus, MD Primary Cardiologist: Virl Axe, MD Requesting Provider: Chauncey Cruel. Posey Pronto, MD  Patient Profile    GIO JANOSKI is a 83 y.o. male with a history of CAD s/p inf STEMI w/ staged PCI/DES to the RCA and LAD in 01/2016, patent stents on cath 10/2016, ICM/HFrEF w/ EF of 36% by cardiac MRI 05/2016, sinus brady, 1st deg avb, LBBB, and GERD, who is being seen today for the evaluation of chest pain and palpitations at the request of Dr. Posey Pronto.  Past Medical History       Past Medical History:  Diagnosis Date  . Arthritis    joints  . CAD (coronary artery disease)    a. 01/2016 Inf STEMI->PCI/Thrombectomy/DES to mid RCA (4.0 x 30 Resolute DES), EF 25-30%; b. 01/2016 Staged PCI/DES to mLAD (3.0x22 Resolute DES); c. 10/2016 Cath: LM nl, LAD patent stent, 30d, D2 70, LCX nl, OM1/2 nl, RCA patent mid/dist stent.  . Cancer ([place])    skin/CLL. on head years ago  . Diverticulitis   . GERD (gastroesophageal reflux disease)   . H/O Bell's palsy   . HFrEF (heart failure with reduced ejection fraction) ([place])   . HOH (hard of hearing)   . Ischemic cardiomyopathy    a. 01/2016 EF 25-30%; b. 04/2016 f/u echo: EF 36%, mild glob HK, basal and mid inflat, inf, infsept, mid apical inf AK, mildly dil LA/RA, mild AI/MR, trace TR; c. 06/2016 Cardiac MRI: EF 36%, apical AK, mod diff HK, septal-lateral dyssynchrony  . Kidney calculi    stones from years ago  . LBBB (left bundle branch block)   . Sinus bradycardia    a. Rates freq in the 40's - asymptomatic.         Past Surgical History:  Procedure Laterality Date  . BACK SURGERY  1983   no metal  . CARDIAC CATHETERIZATION N/A 07/29/2014   Procedure: Left Heart Cath;  Surgeon: Teodoro Spray, MD;  Location: [facility];  Service: Cardiovascular;   Laterality: N/A;  . CARDIAC CATHETERIZATION N/A 01/27/2016   Procedure: Left Heart Cath and Coronary Angiography;  Surgeon: [name], MD;  Location: [facility];  Service: Cardiovascular;  Laterality: N/A;  . CARDIAC CATHETERIZATION N/A 01/27/2016   Procedure: Coronary Stent Intervention;  Surgeon: [name], MD;  Location: [facility];  Service: Cardiovascular;  Laterality: N/A;  . CARDIAC CATHETERIZATION N/A 02/13/2016   Procedure: Coronary Stent Intervention;  Surgeon: [name], MD;  Location: [facility];  Service: Cardiovascular;  Laterality: N/A;  . CARDIAC CATHETERIZATION N/A 02/13/2016   Procedure: Left Heart Cath and Coronary Angiography;  Surgeon: [name], MD;  Location: [facility];  Service: Cardiovascular;  Laterality: N/A;  . CATARACT EXTRACTION W/PHACO Right 07/18/2014   Procedure: CATARACT EXTRACTION PHACO AND INTRAOCULAR LENS PLACEMENT (IOC);  Surgeon: Estill Cotta, MD;  Location: [facility];  Service: Ophthalmology;  Laterality: Right;  [place] 03:11 AP% 28.4 CDE 75.64  . CATARACT EXTRACTION W/PHACO Left 06/19/2015   Procedure: CATARACT EXTRACTION PHACO AND INTRAOCULAR LENS PLACEMENT (IOC);  Surgeon: Estill Cotta, MD;  Location: [facility];  Service: Ophthalmology;  Laterality: Left;  [place] 02:36.5 AP% 27.7 CDE 69.51 fluid pack lot # [id number]  . HERNIA REPAIR Left 2010   inguinal hernia  . LEFT HEART CATH AND CORONARY ANGIOGRAPHY  N/A 11/18/2016   Procedure: LEFT HEART CATH AND CORONARY ANGIOGRAPHY;  Surgeon: Wellington Hampshire, MD;  Location: [facility];  Service: Cardiovascular;  Laterality: N/A;     Allergies       Allergies  Allergen Reactions  . Niaspan [Niacin Er] Anaphylaxis and Other (See Comments)    Episode happened 10 to 15 years ago.  Has not tried any other similar meds since    History of Present Illness    83 y/o ? with the above complex PMH including a h/o inf MI s/p RCA stenting in 01/2016 w/  subsequent staged PCI/DES to the LAD.  He was left w/ LV dysfxn and an EF of 30-35% by echo in 01/2016.  A LifeVest was initially placed.  F/u echo in 04/2016 revealed an EF of 36%, though visually, EF was reported to be 25-30%..   He was seen by Dr. Caryl Comes following MRI and LifeVest was discontinued.  Med rx was maximized.  In 06/2016,  Cardiac MRI was performed and corroborated echo finding (EF 36%) w/ apical AK, mod diff HK, and septal-lateral dyssynchrony. At f/u in 07/2016, ? blocker was d/'c'd in the setting of bradycardia (38) w/ 1st deg AVB.  Lisinopril was also d/c'd in favor of entresto however, he was not able to tolerate entresto 2/2 relative hypotension and lightheadedness, therefor, lisinopril was resumed in late 07/2016.  Mr. Hopkin was admitted in 10/2016 w/ chest pain and underwent cath revealing patent LAD/RCA stents w/ a jailed D2 w/ ostial 70% stenosis.  Med Rx was recommended. He f/u in clinic in 11/2016 and was doing well, was active, and asymptomatic.  He continued to do well w/o symptoms or limitations and was in his usoh until the morning of 1/9, when he awoke @ 4 am and noted a fluttering sensation in his chest and throat associated with 3-4/10 chest squeezing/pressure.  He took a sl NTG w/ minimal relief in discomfort.  He checked his BP/HR and though his BP was nl, HR was 102, which was very unusual for him as he is accustomed to seeing HRs in the 40's to 50's.  He continued to have mild chest pressure and elevated HRs with associated fluttering sensation over the next 8 hrs.  He went to [facility] with his dtr and did not feel any worse while walking but also didn't feel any better. After returning home around 2pm, he continued to feel poorly and had his dtr take him to the ED.  Here, he was treated with 4 baby ASA and another sl NTG with resolution of Ss.  Initial ecg showed sinus brady w/ 2:1 AVB, LBBB, and a HR of 42.  Troponin was nl.  He was admitted for further eval.  Troponins have  remained nl and he has had no recurrence of symptoms.  On telemetry overnight, he has had freq runs of atrial tachycardia with V rates just over 100, and has also had intermittent AIVR.  He is symptom free this AM.  Inpatient Medications    . aspirin EC  81 mg Oral QHS  . atorvastatin  80 mg Oral QPM  . enoxaparin (LOVENOX) injection  40 mg Subcutaneous Q24H  . lisinopril  5 mg Oral QHS  . pantoprazole  80 mg Oral Daily  . sodium chloride flush  3 mL Intravenous Q12H  . spironolactone  12.5 mg Oral QPM  . ticagrelor  90 mg Oral BID  . vitamin B-12  2,500 mcg Oral QODAY  Family History         Family History  Problem Relation Age of Onset  . Heart failure Father    indicated that his mother is deceased. He indicated that his father is deceased.   Social History    Social History        Socioeconomic History  . Marital status: Married    Spouse name: Not on file  . Number of children: Not on file  . Years of education: Not on file  . Highest education level: Not on file  Social Needs  . Financial resource strain: Not on file  . Food insecurity - worry: Not on file  . Food insecurity - inability: Not on file  . Transportation needs - medical: Not on file  . Transportation needs - non-medical: Not on file  Occupational History  . Not on file  Tobacco Use  . Smoking status: Former Smoker    Years: 4.00    Types: Pipe  . Smokeless tobacco: Never Used  . Tobacco comment: qiit 1960  Substance and Sexual Activity  . Alcohol use: No  . Drug use: No  . Sexual activity: Not Currently  Other Topics Concern  . Not on file  Social History Narrative   Lives in [place] with his wife.  She has RA and he helps to take care of her.  He is very active in his yard and around the house.     Review of Systems    General:  No chills, fever, night sweats or weight changes.  Cardiovascular:  +++ chest pain, no dyspnea on exertion, edema, orthopnea, +++ palpitations,  no paroxysmal nocturnal dyspnea. Dermatological: No rash, lesions/masses Respiratory: No cough, dyspnea Urologic: No hematuria, dysuria Abdominal:   No nausea, vomiting, diarrhea, bright red blood per rectum, melena, or hematemesis Neurologic:  No visual changes, wkns, changes in mental status. All other systems reviewed and are otherwise negative except as noted above.  Physical Exam    Blood pressure (!) 114/56, pulse (!) 51, temperature (!) 97.4 F (36.3 C), temperature source Oral, resp. rate 18, height 5\' 11"  (1.803 m), weight 180 lb (81.6 kg), SpO2 100 %.  General: Pleasant, NAD Psych: Normal affect. Neuro: Alert and oriented X 3. Moves all extremities spontaneously. HEENT: Normal   Neck: Supple without bruits or JVD. Lungs:  Resp regular and unlabored, CTA. Heart: Brady, irregular  no s3, s4, or murmurs. Abdomen: Soft, non-tender, non-distended, BS + x 4.  Extremities: No clubbing, cyanosis or edema. DP/PT/Radials 2+ and equal bilaterally.  Labs    RecentLabs(last2labs)        Recent Labs    03/05/17 1430 03/05/17 1807 03/05/17 2144 03/06/17 0046  TROPONINI <0.03 <0.03 <0.03 <0.03     RecentLabs       Lab Results  Component Value Date   WBC 11.3 (H) 03/05/2017   HGB 13.5 03/05/2017   HCT 40.3 03/05/2017   MCV 95.6 03/05/2017   PLT 221 03/05/2017      LastLabs     Recent Labs  Lab 03/05/17 1430  NA 135  K 3.9  CL 102  CO2 27  BUN 12  CREATININE 1.12  CALCIUM 9.0  GLUCOSE 142*     RecentLabs       Lab Results  Component Value Date   CHOL 109 08/01/2016   HDL 32 (L) 08/01/2016   LDLCALC 62 08/01/2016   TRIG 73 08/01/2016       Radiology Studies  ImagingResults  Dg Chest 2 View  Result Date: 03/05/2017 CLINICAL DATA:  Chest pressure beginning this morning. History of myocardial infarction. EXAM: CHEST  2 VIEW COMPARISON:  None. FINDINGS: Heart size is normal with left ventricular prominence. Coronary artery  stents are present. There is aortic atherosclerosis. Pulmonary vascularity is normal. There are a few small pulmonary scars. No infiltrate, collapse or effusion. IMPRESSION: Active disease. Left ventricular prominence. Coronary artery stents. Aortic atherosclerosis. Few linear pulmonary scars. Electronically Signed   By: Nelson Chimes M.D.   On: 03/05/2017 15:02     ECG & Cardiac Imaging    Sinus brady, 42, 2:1 AVB, LBBB.  Assessment & Plan    1.  2:1 AVB w/ intermittent atrial tachycardia and AIVR/Tachy-brady syndrome:  Pt admitted on 1/9 following ~ 8 hrs of mild chest pressure in the setting of elevated HRs - high 80's to low 100's, which he noted as being unusual for him as he typically runs in the 40's to 50's.  He has a h/o sinus brady w/ 1st deg AVB, which previously prevented utilization of ? blocker therapy.  In the ED, he was found to be bradycardic @ a rate of 42 w/ 2:1 AVB (underlying chronic LBBB).  Despite prolonged Ss, trops nl.  He has had no further symptoms but on tele overnight, he has been noted to have frequent runs of atrial tachycardia with V rates into the low 100's.  He has also had intermittent AIVR.  With significant conduction dzs, which limited usage of ? blocker in the past and now intermittent symptomatic atrial tachycardia, he will benefit form Biv pacing.  Seen by Dr. Caryl Comes today.  We have reached out to our EP team @ [facility] and will likely plan to arrange for tx today for BiV pacer either today or tomorrow.  2.  Chest pain/CAD:  S/p prior inf STEMI s/p RCA and LAD DES in 01/2016, both patent by cath 10/2016, though 70ost D2 lesion noted in setting of jailing from LAD stent.  He had been doing well and has been active since last cath.  C/p yesterday not similar to prior angina.  Despite 8 hrs of mild discomfort, trops nl.  Will not pursue ischemic eval.  Cont asa, statin, brilinta.  3.  ICM/HFrEF:  EF 36% by cardiac MRI in 06/2016.  Euvolemic on exam and no recent HF  symptoms.  Cont acei, spiro.  No ? blocker in setting of baseline bradycardia.  Prev did not tolerate entresto 2/2 symptomatic relative hypotension.  With BiV pacing, will be able to resume low-dose ? blocker as BP allows.  Signed, Murray Hodgkins, NP 03/06/2017, 9:27 AM  For questions or updates, please contact   Please consult [email or web address] contactinfo under Cardiology/STEMI.  Patient seen and examined. He presented with tachypalpitations and a heart rate in the 110s or so that was accompanied by chest pain with underlying left bundle branch block and sinus bradycardia (heart rate 30s-40s) that had improved recently with the discontinuation of beta-blockers .  Troponins were negative.  He has cardiomyopathy with an ejection fraction of about 35% by both echo and MR spring 2018.  On arrival, ECG showed sinus rhythm with 2: 1 heart block.  Overnight telemetry demonstrates recurrent tachycardia-atrial with heart rates in the 110 range  Physical examination in addition to the above was notable for displaced PMI, skin was warm and dry with good capillary refill  Telemetry and ECG were reviewed personally as noted above  Impression  Ischemic cardiomyopathy  with a residually obstructed diagonal lesion from jailed stent Left bundle branch block Sinus node dysfunction with bradycardia induced by low-dose carvedilol Atrial tachycardia AIVR Congestive heart failure acute on chronic-systolic  The patient has tachycardia associated chest discomfort heart bradycardia with low-dose beta-blockers cardiomyopathy with underlying left bundle branch block.  I think he needs pacing backup.  This will allow for medical therapy for his tachycardia.  Interestingly, a heart rate of only 100 or so  generated considerable angina suggesting the need for augmented medical therapy for his coronary disease.  With his high-grade heart block occurring in the context of his block, pacing is also  indicated.  With left ventricular dysfunction resynchronization could be considered as we do not have a good idea as to how much pacing he will do or whether some of his LV dysfunction is secondary to left bundle branch block.  I have reviewed this with both him and have discussed this with Dr. Lovena Le.  The plan will be transfer to [facility].

## 2017-03-06 NOTE — Consult Note (Signed)
Cardiology Consult    Patient ID: Christopher Daniels MRN: [id number], DOB/AGE: 1934/11/19   Admit date: 03/05/2017 Date of Consult: 03/06/2017  Primary Physician: Rusty Aus, MD Primary Cardiologist: Virl Axe, MD Requesting Provider: Chauncey Cruel. Posey Pronto, MD  Patient Profile    Christopher Daniels is a 83 y.o. male with a history of CAD s/p inf STEMI w/ staged PCI/DES to the RCA and LAD in 01/2016, patent stents on cath 10/2016, ICM/HFrEF w/ EF of 36% by cardiac MRI 05/2016, sinus brady, 1st deg avb, LBBB, and GERD, who is being seen today for the evaluation of chest pain and palpitations at the request of Dr. Posey Pronto.  Past Medical History   Past Medical History:  Diagnosis Date  . Arthritis    joints  . CAD (coronary artery disease)    a. 01/2016 Inf STEMI->PCI/Thrombectomy/DES to mid RCA (4.0 x 30 Resolute DES), EF 25-30%; b. 01/2016 Staged PCI/DES to mLAD (3.0x22 Resolute DES); c. 10/2016 Cath: LM nl, LAD patent stent, 30d, D2 70, LCX nl, OM1/2 nl, RCA patent mid/dist stent.  . Cancer ([place])    skin/CLL. on head years ago  . Diverticulitis   . GERD (gastroesophageal reflux disease)   . H/O Bell's palsy   . HFrEF (heart failure with reduced ejection fraction) (Christopher Daniels)   . HOH (hard of hearing)   . Ischemic cardiomyopathy    a. 01/2016 EF 25-30%; b. 04/2016 f/u echo: EF 36%, mild glob HK, basal and mid inflat, inf, infsept, mid apical inf AK, mildly dil LA/RA, mild AI/MR, trace TR; c. 06/2016 Cardiac MRI: EF 36%, apical AK, mod diff HK, septal-lateral dyssynchrony  . Kidney calculi    stones from years ago  . LBBB (left bundle branch block)   . Sinus bradycardia    a. Rates freq in the 40's - asymptomatic.    Past Surgical History:  Procedure Laterality Date  . BACK SURGERY  1983   no metal  . CARDIAC CATHETERIZATION N/A 07/29/2014   Procedure: Left Heart Cath;  Surgeon: Teodoro Spray, MD;  Location: [facility];  Service: Cardiovascular;  Laterality: N/A;  . CARDIAC CATHETERIZATION  N/A 01/27/2016   Procedure: Left Heart Cath and Coronary Angiography;  Surgeon: [name], MD;  Location: [facility];  Service: Cardiovascular;  Laterality: N/A;  . CARDIAC CATHETERIZATION N/A 01/27/2016   Procedure: Coronary Stent Intervention;  Surgeon: [name], MD;  Location: [facility];  Service: Cardiovascular;  Laterality: N/A;  . CARDIAC CATHETERIZATION N/A 02/13/2016   Procedure: Coronary Stent Intervention;  Surgeon: [name], MD;  Location: [facility];  Service: Cardiovascular;  Laterality: N/A;  . CARDIAC CATHETERIZATION N/A 02/13/2016   Procedure: Left Heart Cath and Coronary Angiography;  Surgeon: [name], MD;  Location: [facility];  Service: Cardiovascular;  Laterality: N/A;  . CATARACT EXTRACTION W/PHACO Right 07/18/2014   Procedure: CATARACT EXTRACTION PHACO AND INTRAOCULAR LENS PLACEMENT (IOC);  Surgeon: Estill Cotta, MD;  Location: [facility];  Service: Ophthalmology;  Laterality: Right;  [place] 03:11 AP% 28.4 CDE 75.64  . CATARACT EXTRACTION W/PHACO Left 06/19/2015   Procedure: CATARACT EXTRACTION PHACO AND INTRAOCULAR LENS PLACEMENT (IOC);  Surgeon: Estill Cotta, MD;  Location: [facility];  Service: Ophthalmology;  Laterality: Left;  [place] 02:36.5 AP% 27.7 CDE 69.51 fluid pack lot # [id number]  . HERNIA REPAIR Left 2010   inguinal hernia  . LEFT HEART CATH AND CORONARY ANGIOGRAPHY N/A 11/18/2016   Procedure: LEFT HEART CATH AND  CORONARY ANGIOGRAPHY;  Surgeon: Wellington Hampshire, MD;  Location: [facility];  Service: Cardiovascular;  Laterality: N/A;     Allergies  Allergies  Allergen Reactions  . Niaspan [Niacin Er] Anaphylaxis and Other (See Comments)    Episode happened 10 to 15 years ago.  Has not tried any other similar meds since    History of Present Illness    83 y/o ? with the above complex PMH including a h/o inf MI s/p RCA stenting in 01/2016 w/ subsequent staged PCI/DES to the LAD.  He was left w/ LV dysfxn and an EF  of 30-35% by echo in 01/2016.  A LifeVest was initially placed.  F/u echo in 04/2016 revealed an EF of 36%, though visually, EF was reported to be 25-30%..   He was seen by Dr. Caryl Comes following MRI and LifeVest was discontinued.  Med rx was maximized.  In 06/2016,  Cardiac MRI was performed and corroborated echo finding (EF 36%) w/ apical AK, mod diff HK, and septal-lateral dyssynchrony. At f/u in 07/2016,  blocker was d/'c'd in the setting of bradycardia (38) w/ 1st deg AVB.  Lisinopril was also d/c'd in favor of entresto however, he was not able to tolerate entresto 2/2 relative hypotension and lightheadedness, therefor, lisinopril was resumed in late 07/2016.  Christopher Daniels was admitted in 10/2016 w/ chest pain and underwent cath revealing patent LAD/RCA stents w/ a jailed D2 w/ ostial 70% stenosis.  Med Rx was recommended. He f/u in clinic in 11/2016 and was doing well, was active, and asymptomatic.  He continued to do well w/o symptoms or limitations and was in his usoh until the morning of 1/9, when he awoke @ 4 am and noted a fluttering sensation in his chest and throat associated with 3-4/10 chest squeezing/pressure.  He took a sl NTG w/ minimal relief in discomfort.  He checked his BP/HR and though his BP was nl, HR was 102, which was very unusual for him as he is accustomed to seeing HRs in the 40's to 50's.  He continued to have mild chest pressure and elevated HRs with associated fluttering sensation over the next 8 hrs.  He went to [name] with his dtr and did not feel any worse while walking but also didn't feel any better. After returning home around 2pm, he continued to feel poorly and had his dtr take him to the ED.  Here, he was treated with 4 baby ASA and another sl NTG with resolution of Ss.  Initial ecg showed sinus brady w/ 2:1 AVB, LBBB, and a HR of 42.  Troponin was nl.  He was admitted for further eval.  Troponins have remained nl and he has had no recurrence of symptoms.  On telemetry overnight,  he has had freq runs of atrial tachycardia with V rates just over 100, and has also had intermittent AIVR.  He is symptom free this AM.  Inpatient Medications    . aspirin EC  81 mg Oral QHS  . atorvastatin  80 mg Oral QPM  . enoxaparin (LOVENOX) injection  40 mg Subcutaneous Q24H  . lisinopril  5 mg Oral QHS  . pantoprazole  80 mg Oral Daily  . sodium chloride flush  3 mL Intravenous Q12H  . spironolactone  12.5 mg Oral QPM  . ticagrelor  90 mg Oral BID  . vitamin B-12  2,500 mcg Oral QODAY    Family History    Family History  Problem Relation Age of Onset  .  Heart failure Father    indicated that his mother is deceased. He indicated that his father is deceased.   Social History    Social History   Socioeconomic History  . Marital status: Married    Spouse name: Not on file  . Number of children: Not on file  . Years of education: Not on file  . Highest education level: Not on file  Social Needs  . Financial resource strain: Not on file  . Food insecurity - worry: Not on file  . Food insecurity - inability: Not on file  . Transportation needs - medical: Not on file  . Transportation needs - non-medical: Not on file  Occupational History  . Not on file  Tobacco Use  . Smoking status: Former Smoker    Years: 4.00    Types: Pipe  . Smokeless tobacco: Never Used  . Tobacco comment: qiit 1960  Substance and Sexual Activity  . Alcohol use: No  . Drug use: No  . Sexual activity: Not Currently  Other Topics Concern  . Not on file  Social History Narrative   Lives in [place] with his wife.  She has RA and he helps to take care of her.  He is very active in his yard and around the house.     Review of Systems    General:  No chills, fever, night sweats or weight changes.  Cardiovascular:  +++ chest pain, no dyspnea on exertion, edema, orthopnea, +++ palpitations, no paroxysmal nocturnal dyspnea. Dermatological: No rash, lesions/masses Respiratory: No cough,  dyspnea Urologic: No hematuria, dysuria Abdominal:   No nausea, vomiting, diarrhea, bright red blood per rectum, melena, or hematemesis Neurologic:  No visual changes, wkns, changes in mental status. All other systems reviewed and are otherwise negative except as noted above.  Physical Exam    Blood pressure (!) 114/56, pulse (!) 51, temperature (!) 97.4 F (36.3 C), temperature source Oral, resp. rate 18, height 5\' 11"  (1.803 m), weight 180 lb (81.6 kg), SpO2 100 %.  General: Pleasant, NAD Psych: Normal affect. Neuro: Alert and oriented X 3. Moves all extremities spontaneously. HEENT: Normal  Neck: Supple without bruits or JVD. Lungs:  Resp regular and unlabored, CTA. Heart: Brady, irregular  no s3, s4, or murmurs. Abdomen: Soft, non-tender, non-distended, BS + x 4.  Extremities: No clubbing, cyanosis or edema. DP/PT/Radials 2+ and equal bilaterally.  Labs    Recent Labs    03/05/17 1430 03/05/17 1807 03/05/17 2144 03/06/17 0046  TROPONINI <0.03 <0.03 <0.03 <0.03   Lab Results  Component Value Date   WBC 11.3 (H) 03/05/2017   HGB 13.5 03/05/2017   HCT 40.3 03/05/2017   MCV 95.6 03/05/2017   PLT 221 03/05/2017    Recent Labs  Lab 03/05/17 1430  NA 135  K 3.9  CL 102  CO2 27  BUN 12  CREATININE 1.12  CALCIUM 9.0  GLUCOSE 142*   Lab Results  Component Value Date   CHOL 109 08/01/2016   HDL 32 (L) 08/01/2016   LDLCALC 62 08/01/2016   TRIG 73 08/01/2016     Radiology Studies    Dg Chest 2 View  Result Date: 03/05/2017 CLINICAL DATA:  Chest pressure beginning this morning. History of myocardial infarction. EXAM: CHEST  2 VIEW COMPARISON:  None. FINDINGS: Heart size is normal with left ventricular prominence. Coronary artery stents are present. There is aortic atherosclerosis. Pulmonary vascularity is normal. There are a few small pulmonary scars. No infiltrate, collapse or effusion.  IMPRESSION: Active disease. Left ventricular prominence. Coronary artery  stents. Aortic atherosclerosis. Few linear pulmonary scars. Electronically Signed   By: Nelson Chimes M.D.   On: 03/05/2017 15:02    ECG & Cardiac Imaging    Sinus brady, 42, 2:1 AVB, LBBB.  Assessment & Plan    1.  2:1 AVB w/ intermittent atrial tachycardia and AIVR/Tachy-brady syndrome:  Pt admitted on 1/9 following ~ 8 hrs of mild chest pressure in the setting of elevated HRs - high 80's to low 100's, which he noted as being unusual for him as he typically runs in the 40's to 50's.  He has a h/o sinus brady w/ 1st deg AVB, which previously prevented utilization of  blocker therapy.  In the ED, he was found to be bradycardic @ a rate of 42 w/ 2:1 AVB (underlying chronic LBBB).  Despite prolonged Ss, trops nl.  He has had no further symptoms but on tele overnight, he has been noted to have frequent runs of atrial tachycardia with V rates into the low 100's.  He has also had intermittent AIVR.  With significant conduction dzs, which limited usage of  blocker in the past and now intermittent symptomatic atrial tachycardia, he will benefit form Biv pacing.  Seen by Dr. Caryl Comes today.  We have reached out to our EP team @ [facility] and will likely plan to arrange for tx today for BiV pacer either today or tomorrow.  2.  Chest pain/CAD:  S/p prior inf STEMI s/p RCA and LAD DES in 01/2016, both patent by cath 10/2016, though 70ost D2 lesion noted in setting of jailing from LAD stent.  He had been doing well and has been active since last cath.  C/p yesterday not similar to prior angina.  Despite 8 hrs of mild discomfort, trops nl.  Will not pursue ischemic eval.  Cont asa, statin, brilinta.  3.  ICM/HFrEF:  EF 36% by cardiac MRI in 06/2016.  Euvolemic on exam and no recent HF symptoms.  Cont acei, spiro.  No  blocker in setting of baseline bradycardia.  Prev did not tolerate entresto 2/2 symptomatic relative hypotension.  With BiV pacing, will be able to resume low-dose  blocker as BP  allows.  Signed, Murray Hodgkins, NP 03/06/2017, 9:27 AM  For questions or updates, please contact   Please consult [email or web address] for contact info under Cardiology/STEMI.  Patient seen and examined. He presented with tachypalpitations and a heart rate in the 110s or so that was accompanied by chest pain with underlying left bundle branch block and sinus bradycardia (heart rate 30s-40s) that had improved recently with the discontinuation of beta-blockers .  Troponins were negative.  He has cardiomyopathy with an ejection fraction of about 35% by both echo and MR spring 2018.  On arrival, ECG showed sinus rhythm with 2: 1 heart block.  Overnight telemetry demonstrates recurrent tachycardia-atrial with heart rates in the 110 range  Physical examination in addition to the above was notable for displaced PMI, skin was warm and dry with good capillary refill  Telemetry and ECG were reviewed personally as noted above  Impression  Ischemic cardiomyopathy with a residually obstructed diagonal lesion from jailed stent Left bundle branch block Sinus node dysfunction with bradycardia induced by low-dose carvedilol Atrial tachycardia AIVR Congestive heart failure acute on chronic-systolic  The patient has tachycardia associated chest discomfort heart bradycardia with low-dose beta-blockers cardiomyopathy with underlying left bundle branch block.  I think he needs pacing backup.  This will allow for  medical therapy for his tachycardia.  Interestingly, a heart rate of only 100 or so  generated considerable angina suggesting the need for augmented medical therapy for his coronary disease.  With his high-grade heart block occurring in the context of his block, pacing is also indicated.  With left ventricular dysfunction resynchronization could be considered as we do not have a good idea as to how much pacing he will do or whether some of his LV dysfunction is secondary to left bundle branch block.  I  have reviewed this with both him and have discussed this with Dr. Lovena Le.  The plan will be transfer to [facility].

## 2017-03-06 NOTE — H&P (Signed)
Signed          Expand All Collapse All            Expand widget buttonCollapse widget button     Hide copied text   Hover for detailscustomization button                                                                                                         untitled image      Cardiology Consult       Patient ID: Christopher Daniels  MRN: [id number], DOB/AGE: February 11, 1935      Admit date: 03/05/2017  Date of Consult: 03/06/2017     Primary Physician: Rusty Aus, MD  Primary Cardiologist: Virl Axe, MD  Requesting Provider: Chauncey Cruel. Posey Pronto, MD      Patient Profile       Christopher Daniels is a 83 y.o. male with a history of CAD s/p inf STEMI w/ staged PCI/DES to the RCA and LAD in 01/2016, patent stents on cath 10/2016, ICM/HFrEF w/ EF of 36% by cardiac MRI 05/2016, sinus brady, 1st deg avb, LBBB, and GERD, who is being seen today for the evaluation of chest pain and palpitations at the request of Dr. Posey Pronto.      Past Medical History            Past Medical History:    Diagnosis   Date    .   Arthritis            joints    .   CAD (coronary artery disease)            a. 01/2016 Inf STEMI->PCI/Thrombectomy/DES to mid RCA (4.0 x 30 Resolute DES), EF 25-30%; b. 01/2016 Staged PCI/DES to mLAD (3.0x22 Resolute DES); c. 10/2016 Cath: LM nl, LAD patent stent, 30d, D2 70, LCX nl, OM1/2 nl, RCA patent mid/dist stent.    .   Cancer ([place])            skin/CLL. on head years ago    .   Diverticulitis        .   GERD (gastroesophageal reflux disease)        .   H/O Bell's palsy        .   HFrEF (heart failure with reduced ejection fraction) ([place])        .   HOH (hard of hearing)        .   Ischemic cardiomyopathy            a. 01/2016 EF 25-30%; b. 04/2016 f/u echo: EF 36%, mild glob  HK, basal and mid inflat, inf, infsept, mid apical inf AK, mildly dil LA/RA, mild AI/MR, trace TR; c. 06/2016 Cardiac MRI: EF 36%, apical AK, mod diff HK, septal-lateral dyssynchrony    .   Kidney calculi            stones from years ago    .   LBBB (left bundle branch block)        .  Sinus bradycardia            a. Rates freq in the 40's - asymptomatic.             Past Surgical History:    Procedure   Laterality   Date    .   BACK SURGERY       1983        no metal    .   CARDIAC CATHETERIZATION   N/A   07/29/2014        Procedure: Left Heart Cath;  Surgeon: Teodoro Spray, MD;  Location: [facility];  Service: Cardiovascular;  Laterality: N/A;    .   CARDIAC CATHETERIZATION   N/A   01/27/2016        Procedure: Left Heart Cath and Coronary Angiography;  Surgeon: [name], MD;  Location: [facility];  Service: Cardiovascular;  Laterality: N/A;    .   CARDIAC CATHETERIZATION   N/A   01/27/2016        Procedure: Coronary Stent Intervention;  Surgeon: [name], MD;  Location: [facility];  Service: Cardiovascular;  Laterality: N/A;    .   CARDIAC CATHETERIZATION   N/A   02/13/2016        Procedure: Coronary Stent Intervention;  Surgeon: [name], MD;  Location: [facility];  Service: Cardiovascular;  Laterality: N/A;    .   CARDIAC CATHETERIZATION   N/A   02/13/2016        Procedure: Left Heart Cath and Coronary Angiography;  Surgeon: [name], MD;  Location: [facility];  Service: Cardiovascular;  Laterality: N/A;    .   CATARACT EXTRACTION W/PHACO   Right   07/18/2014        Procedure: CATARACT EXTRACTION PHACO AND INTRAOCULAR LENS PLACEMENT (IOC);  Surgeon: Estill Cotta, MD;  Location: [facility];  Service: Ophthalmology;  Laterality: Right;  [place] 03:11  AP% 28.4  CDE 75.64    .   CATARACT EXTRACTION W/PHACO   Left    06/19/2015        Procedure: CATARACT EXTRACTION PHACO AND INTRAOCULAR LENS PLACEMENT (IOC);  Surgeon: Estill Cotta, MD;  Location: [facility];  Service: Ophthalmology;  Laterality: Left;  [place] 02:36.5  AP% 27.7  CDE 69.51  fluid pack lot # [id number]    .   HERNIA REPAIR   Left   2010        inguinal hernia    .   LEFT HEART CATH AND CORONARY ANGIOGRAPHY   N/A   11/18/2016        Procedure: LEFT HEART CATH AND CORONARY ANGIOGRAPHY;  Surgeon: Wellington Hampshire, MD;  Location: [facility];  Service: Cardiovascular;  Laterality: N/A;          Allergies           Allergies    Allergen   Reactions    .   Niaspan [Niacin Er]   Anaphylaxis and Other (See Comments)            Episode happened 10 to 15 years ago.  Has not tried any other similar meds since           History of Present Illness       83 y/o ? with the above complex PMH including a h/o inf MI s/p RCA stenting in 01/2016 w/ subsequent staged PCI/DES to the LAD.  He was left w/ LV dysfxn  and an EF of 30-35% by echo in 01/2016.  A LifeVest was initially placed.  F/u echo in 04/2016 revealed an EF of 36%, though visually, EF was reported to be 25-30%..   He was seen by Dr. Caryl Comes following MRI and LifeVest was discontinued.  Med rx was maximized.  In 06/2016,  Cardiac MRI was performed and corroborated echo finding (EF 36%) w/ apical AK, mod diff HK, and septal-lateral dyssynchrony. At f/u in 07/2016, ? blocker was d/'c'd in the setting of bradycardia (38) w/ 1st deg AVB.  Lisinopril was also d/c'd in favor of entresto however, he was not able to tolerate entresto 2/2 relative hypotension and lightheadedness, therefor, lisinopril was resumed in late 07/2016.     Christopher Daniels was admitted in 10/2016 w/ chest pain and underwent cath revealing patent LAD/RCA stents w/ a jailed D2 w/ ostial 70% stenosis.  Med Rx was recommended. He f/u in clinic in 11/2016 and was doing well,  was active, and asymptomatic.  He continued to do well w/o symptoms or limitations and was in his usoh until the morning of 1/9, when he awoke @ 4 am and noted a fluttering sensation in his chest and throat associated with 3-4/10 chest squeezing/pressure.  He took a sl NTG w/ minimal relief in discomfort.  He checked his BP/HR and though his BP was nl, HR was 102, which was very unusual for him as he is accustomed to seeing HRs in the 40's to 50's.  He continued to have mild chest pressure and elevated HRs with associated fluttering sensation over the next 8 hrs.  He went to [facility] with his dtr and did not feel any worse while walking but also didn't feel any better. After returning home around 2pm, he continued to feel poorly and had his dtr take him to the ED.  Here, he was treated with 4 baby ASA and another sl NTG with resolution of Ss.  Initial ecg showed sinus brady w/ 2:1 AVB, LBBB, and a HR of 42.  Troponin was nl.  He was admitted for further eval.  Troponins have remained nl and he has had no recurrence of symptoms.  On telemetry overnight, he has had freq runs of atrial tachycardia with V rates just over 100, and has also had intermittent AIVR.  He is symptom free this AM.      Inpatient Medications        .   aspirin EC    81 mg   Oral   QHS    .   atorvastatin    80 mg   Oral   QPM    .   enoxaparin (LOVENOX) injection    40 mg   Subcutaneous   Q24H    .   lisinopril    5 mg   Oral   QHS    .   pantoprazole    80 mg   Oral   Daily    .   sodium chloride flush    3 mL   Intravenous   Q12H    .   spironolactone    12.5 mg   Oral   QPM    .   ticagrelor    90 mg   Oral   BID    .   vitamin B-12    2,500 mcg   Oral   QODAY           Family History  Family History    Problem   Relation   Age of Onset    .   Heart failure   Father           indicated that  his mother is deceased. He indicated that his father is deceased.         Social History        Social History             Socioeconomic History    .   Marital status:   Married            Spouse name:   Not on file    .   Number of children:   Not on file    .   Years of education:   Not on file    .   Highest education level:   Not on file    Social Needs    .   Financial resource strain:   Not on file    .   Food insecurity - worry:   Not on file    .   Food insecurity - inability:   Not on file    .   Transportation needs - medical:   Not on file    .   Transportation needs - non-medical:   Not on file    Occupational History    .   Not on file    Tobacco Use    .   Smoking status:   Former Smoker            Years:   4.00            Types:   Pipe    .   Smokeless tobacco:   Never Used    .   Tobacco comment: qiit 1960    Substance and Sexual Activity    .   Alcohol use:   No    .   Drug use:   No    .   Sexual activity:   Not Currently    Other Topics   Concern    .   Not on file    Social History Narrative        Lives in [place] with his wife.  She has RA and he helps to take care of her.  He is very active in his yard and around the house.           Review of Systems       General:  No chills, fever, night sweats or weight changes.   Cardiovascular:  +++ chest pain, no dyspnea on exertion, edema, orthopnea, +++ palpitations, no paroxysmal nocturnal dyspnea.  Dermatological: No rash, lesions/masses  Respiratory: No cough, dyspnea  Urologic: No hematuria, dysuria  Abdominal:   No nausea, vomiting, diarrhea, bright red blood per rectum, melena, or hematemesis  Neurologic:  No visual changes, wkns, changes in mental status.  All other systems reviewed and are otherwise negative except as noted above.       Physical Exam       Blood pressure (!) 114/56, pulse (!) 51, temperature (!) 97.4 F (36.3 C), temperature source Oral, resp. rate 18, height 5\' 11"  (1.803 m), weight 180 lb (81.6 kg), SpO2 100 %.   General: Pleasant, NAD  Psych: Normal affect.  Neuro: Alert and oriented X 3. Moves all extremities spontaneously.  HEENT: Normal    Neck: Supple without bruits or JVD.  Lungs:  Resp regular and unlabored, CTA.  Heart: Brady, irregular  no s3, s4, or murmurs.  Abdomen: Soft, non-tender, non-distended, BS + x 4.   Extremities: No clubbing, cyanosis or edema. DP/PT/Radials 2+ and equal bilaterally.      Labs       Recent Labs (last 2 labs)                                                             Recent Labs                                                                                              Last Labs                                                                                   Recent Labs                                                                                     Radiology Studies         Imaging Results                     ECG & Cardiac Imaging       Sinus brady, 42, 2:1 AVB, LBBB.      Assessment & Plan       1.  2:1 AVB w/ intermittent atrial tachycardia and AIVR/Tachy-brady syndrome:  Pt admitted on 1/9 following ~ 8 hrs of mild chest pressure in the setting of elevated HRs - high 80's to low 100's, which he noted as being unusual for him as he typically runs in the 40's to 50's.  He has a h/o sinus brady w/ 1st deg AVB, which previously prevented utilization of ? blocker therapy.  In the ED, he was found to be bradycardic @ a rate of 42 w/ 2:1 AVB (underlying chronic LBBB).  Despite prolonged Ss, trops  nl.  He has had no further symptoms but on tele overnight, he has been noted to have frequent runs of atrial tachycardia with V rates into the low 100's.  He has also had intermittent AIVR.  With significant conduction dzs, which limited usage of ? blocker in the past and now intermittent symptomatic atrial tachycardia, he will benefit  form Biv pacing.  Seen by Dr. Caryl Comes today.  We have reached out to our EP team @ [facility] and will likely plan to arrange for tx today for BiV pacer either today or tomorrow.     2.  Chest pain/CAD:  S/p prior inf STEMI s/p RCA and LAD DES in 01/2016, both patent by cath 10/2016, though 70ost D2 lesion noted in setting of jailing from LAD stent.  He had been doing well and has been active since last cath.  C/p yesterday not similar to prior angina.  Despite 8 hrs of mild discomfort, trops nl.  Will not pursue ischemic eval.  Cont asa, statin, brilinta.     3.  ICM/HFrEF:  EF 36% by cardiac MRI in 06/2016.  Euvolemic on exam and no recent HF symptoms.  Cont acei, spiro.  No ? blocker in setting of baseline bradycardia.  Prev did not tolerate entresto 2/2 symptomatic relative hypotension.  With BiV pacing, will be able to resume low-dose ? blocker as BP allows.     Signed,  Murray Hodgkins, NP  03/06/2017, 9:27 AM     For questions or updates, please contact untitled image   Please consult [email or web address] for contact info under Cardiology/STEMI.     Patient seen and examined.  He presented with tachypalpitations and a heart rate in the 110s or so that was accompanied by chest pain with underlying left bundle branch block and sinus bradycardia (heart rate 30s-40s) that had improved recently with the discontinuation of beta-blockers .  Troponins were negative.  He has cardiomyopathy with an ejection fraction of about 35% by both echo and MR spring 2018.     On arrival, ECG showed sinus rhythm with 2: 1 heart block.  Overnight telemetry demonstrates recurrent  tachycardia-atrial with heart rates in the 110 range     Physical examination in addition to the above was notable for displaced PMI, skin was warm and dry with good capillary refill     Telemetry and ECG were reviewed personally as noted above     Impression   Ischemic cardiomyopathy with a residually obstructed diagonal lesion from jailed stent  Left bundle branch block  Sinus node dysfunction with bradycardia induced by low-dose carvedilol  Atrial tachycardia  AIVR  Congestive heart failure acute on chronic-systolic     The patient has tachycardia associated chest discomfort heart bradycardia with low-dose beta-blockers cardiomyopathy with underlying left bundle branch block.  I think he needs pacing backup.  This will allow for medical therapy for his tachycardia.  Interestingly, a heart rate of only 100 or so  generated considerable angina suggesting the need for augmented medical therapy for his coronary disease.     With his high-grade heart block occurring in the context of his block, pacing is also indicated.  With left ventricular dysfunction resynchronization could be considered as we do not have a good idea as to how much pacing he will do or whether some of his LV dysfunction is secondary to left bundle branch block.  I have reviewed this with both him and have discussed this with Dr. Lovena Le.  The plan will be transfer to [facility]  Patient seen and examined. Discussed with Dr. Renaldo Reel. Agree with the findings as noted above. The patient has been transferred to undergo BiV PM insertion secondary to LBBB, 2:1 AV block, and will need uptitration of his medical therapy. I have reviewed the indications, risks/benefits/goals/expectations of PM insertion and  he is willing to proceed. With LBBB, LV dysfunction and 2:1 AV block, will plan biv PPM.  Mikle Bosworth.D.

## 2017-03-06 NOTE — Progress Notes (Signed)
Swelling and  hematoma noted at site of device insertion by TMink-RCIS. Dr Lovena Le paged by [facility]. Manual pressure held 44min directly over device site by Stewart-RCIS and an EP pressure dressing applied per VO Dr Lovena Le by TMink-RCIS and AStewart-RCIS.  Lt shoulder site is slightly bruised, but no hematoma.

## 2017-03-06 NOTE — Discharge Summary (Signed)
[facility] NAME: Christopher Daniels    MR#:  [id number]  DATE OF BIRTH:  05-21-1934  DATE OF ADMISSION:  03/05/2017 ADMITTING PHYSICIAN: Nicholes Mango, MD  DATE OF DISCHARGE: 03/05/2017  PRIMARY CARE PHYSICIAN: Rusty Aus, MD    ADMISSION DIAGNOSIS:  Chronic sinus bradycardia [R00.1] Chest pain with moderate risk for cardiac etiology [R07.9]  DISCHARGE DIAGNOSIS:  2:1 AVB with intermittent atrial tachycardia/Tachy-brady syndrome Ischemic CMP  SECONDARY DIAGNOSIS:   Past Medical History:  Diagnosis Date  . Arthritis    joints  . CAD (coronary artery disease)    a. 01/2016 Inf STEMI->PCI/Thrombectomy/DES to mid RCA (4.0 x 30 Resolute DES), EF 25-30%; b. 01/2016 Staged PCI/DES to mLAD (3.0x22 Resolute DES); c. 10/2016 Cath: LM nl, LAD patent stent, 30d, D2 70, LCX nl, OM1/2 nl, RCA patent mid/dist stent.  . Cancer ([place])    skin/CLL. on head years ago  . Diverticulitis   . GERD (gastroesophageal reflux disease)   . H/O Bell's palsy   . HFrEF (heart failure with reduced ejection fraction) ([place])   . HOH (hard of hearing)   . Ischemic cardiomyopathy    a. 01/2016 EF 25-30%; b. 04/2016 f/u echo: EF 36%, mild glob HK, basal and mid inflat, inf, infsept, mid apical inf AK, mildly dil LA/RA, mild AI/MR, trace TR; c. 06/2016 Cardiac MRI: EF 36%, apical AK, mod diff HK, septal-lateral dyssynchrony  . Kidney calculi    stones from years ago  . LBBB (left bundle branch block)   . Sinus bradycardia    a. Rates freq in the 40's - asymptomatic.    HOSPITAL COURSE:   [name] is a 83 y.o. male with a history of CAD s/p inf STEMI w/ staged PCI/DES to the RCA and LAD in 01/2016, patent stents on cath 10/2016, ICM/HFrEF w/ EF of 36% by cardiac MRI 05/2016, sinus brady, 1st deg avb, LBBB, and GERD, who was admitted for the evaluation of chest pain and palpitations   1.  Tachy-bradycardia syndrome -Patient was seen by cardiology.  Dr.  Caryl Comes recommends patient be transferred to [facility] for biventricular pacemaker placement. -Chest pain or palpitation. -Troponin x3- -EKG shows sinus bradycardia , 2:1 AVB and LBBB -Patient not on any rate blocking agent  2.  Ischemic cardiomyopathy EF of 35% -Continue cardiac meds -Patient is on aspirin, Brilinta, lisinopril, statin,, Spironolactone  3.  Hypertension continue lisinopril  4.  GERD continue PPI  5.  DVT prophylaxis on Lovenox  Patient will transfer to [facility] when bed available. Discussed with patient and daughter Discussed with Dr. Fletcher Anon  CONSULTS OBTAINED:  Treatment Team:  Evans Lance, MD  DRUG ALLERGIES:   Allergies  Allergen Reactions  . Niaspan [Niacin Er] Anaphylaxis and Other (See Comments)    Episode happened 10 to 15 years ago.  Has not tried any other similar meds since    DISCHARGE MEDICATIONS:   Allergies as of 03/06/2017      Reactions   Niaspan [niacin Er] Anaphylaxis, Other (See Comments)   Episode happened 10 to 15 years ago.  Has not tried any other similar meds since      Medication List    TAKE these medications   aspirin 81 MG tablet Take 81 mg by mouth at bedtime.   atorvastatin 80 MG tablet Commonly known as:  LIPITOR Take 1 tablet (80 mg total) by mouth every evening.   B-12 2500 MCG Tabs  Take 2,500 mcg by mouth every other day.   co-enzyme Q-10 30 MG capsule Take 30 mg by mouth daily.   IRON-C PO Take 1 tablet by mouth 2 (two) times daily.   lisinopril 5 MG tablet Commonly known as:  PRINIVIL,ZESTRIL Take 1 tablet (5 mg total) by mouth daily. What changed:  when to take this   nitroGLYCERIN 0.4 MG SL tablet Commonly known as:  NITROSTAT Place 1 tablet (0.4 mg total) under the tongue every 5 (five) minutes x 3 doses as needed for chest pain.   pantoprazole 40 MG tablet Commonly known as:  PROTONIX Take 40 mg by mouth 2 (two) times daily.   REFRESH TEARS 0.5 % Soln Generic drug:   carboxymethylcellulose Place 1 drop into both eyes 3 (three) times daily as needed (for dry eyes).   spironolactone 25 MG tablet Commonly known as:  ALDACTONE Take 0.5 tablets (12.5 mg total) by mouth daily.   ticagrelor 90 MG Tabs tablet Commonly known as:  BRILINTA Take 1 tablet (90 mg total) by mouth 2 (two) times daily. Please keep upcoming appointment for further refills.       If you experience worsening of your admission symptoms, develop shortness of breath, life threatening emergency, suicidal or homicidal thoughts you must seek medical attention immediately by calling 911 or calling your MD immediately  if symptoms less severe.  You Must read complete instructions/literature along with all the possible adverse reactions/side effects for all the Medicines you take and that have been prescribed to you. Take any new Medicines after you have completely understood and accept all the possible adverse reactions/side effects.   Please note  You were cared for by a hospitalist during your hospital stay. If you have any questions about your discharge medications or the care you received while you were in the hospital after you are discharged, you can call the unit and asked to speak with the hospitalist on call if the hospitalist that took care of you is not available. Once you are discharged, your primary care physician will handle any further medical issues. Please note that NO REFILLS for any discharge medications will be authorized once you are discharged, as it is imperative that you return to your primary care physician (or establish a relationship with a primary care physician if you do not have one) for your aftercare needs so that they can reassess your need for medications and monitor your lab values. Today   SUBJECTIVE   No new complaints  VITAL SIGNS:  Blood pressure (!) 114/56, pulse (!) 51, temperature (!) 97.4 F (36.3 C), temperature source Oral, resp. rate 18, height 5'  11" (1.803 m), weight 81.6 kg (180 lb), SpO2 100 %.  I/O:    Intake/Output Summary (Last 24 hours) at 03/06/2017 1041 Last data filed at 03/06/2017 1017 Gross per 24 hour  Intake 600 ml  Output -  Net 600 ml    PHYSICAL EXAMINATION:  GENERAL:  83 y.o.-year-old patient lying in the bed with no acute distress.  EYES: Pupils equal, round, reactive to light and accommodation. No scleral icterus. Extraocular muscles intact.  HEENT: Head atraumatic, normocephalic. Oropharynx and nasopharynx clear.  NECK:  Supple, no jugular venous distention. No thyroid enlargement, no tenderness.  LUNGS: Normal breath sounds bilaterally, no wheezing, rales,rhonchi or crepitation. No use of accessory muscles of respiration.  CARDIOVASCULAR: S1, S2 normal. No murmurs, rubs, or gallops.  ABDOMEN: Soft, non-tender, non-distended. Bowel sounds present. No organomegaly or mass.  EXTREMITIES: No  pedal edema, cyanosis, or clubbing.  NEUROLOGIC: Cranial nerves II through XII are intact. Muscle strength 5/5 in all extremities. Sensation intact. Gait not checked.  PSYCHIATRIC: The patient is alert and oriented x 3.  SKIN: No obvious rash, lesion, or ulcer.   DATA REVIEW:   CBC  Recent Labs  Lab 03/05/17 1430  WBC 11.3*  HGB 13.5  HCT 40.3  PLT 221    Chemistries  Recent Labs  Lab 03/05/17 1430  NA 135  K 3.9  CL 102  CO2 27  GLUCOSE 142*  BUN 12  CREATININE 1.12  CALCIUM 9.0    Microbiology Results   No results found for this or any previous visit (from the past 240 hour(s)).  RADIOLOGY:  Dg Chest 2 View  Result Date: 03/05/2017 CLINICAL DATA:  Chest pressure beginning this morning. History of myocardial infarction. EXAM: CHEST  2 VIEW COMPARISON:  None. FINDINGS: Heart size is normal with left ventricular prominence. Coronary artery stents are present. There is aortic atherosclerosis. Pulmonary vascularity is normal. There are a few small pulmonary scars. No infiltrate, collapse or effusion.  IMPRESSION: Active disease. Left ventricular prominence. Coronary artery stents. Aortic atherosclerosis. Few linear pulmonary scars. Electronically Signed   By: Nelson Chimes M.D.   On: 03/05/2017 15:02     Management plans discussed with the patient, family and they are in agreement.  CODE STATUS:     Code Status Orders  (From admission, onward)        Start     Ordered   03/05/17 1747  Full code  Continuous     03/05/17 1747    Code Status History    Date Active Date Inactive Code Status Order ID Comments User Context   11/18/2016 11:04 11/18/2016 16:27 Full Code [id number]  Wellington Hampshire, MD Inpatient   02/13/2016 09:23 02/13/2016 21:32 Full Code [id number]  [name], MD Inpatient   01/27/2016 12:42 01/29/2016 20:45 Full Code [id number]  [name], MD Inpatient   01/27/2016 12:42 01/27/2016 12:42 Full Code [id number]  [name], MD Inpatient   07/29/2014 16:26 07/29/2014 20:31 Full Code [id number]  Teodoro Spray, MD Inpatient    Advance Directive Documentation     Most Recent Value  Type of Advance Directive  Healthcare Power of [place], Living will  Pre-existing out of facility DNR order (yellow form or pink MOST form)  No data  "MOST" Form in Place?  No data      TOTAL TIME TAKING CARE OF THIS PATIENT: *40* minutes.    Fritzi Mandes M.D on 03/06/2017 at 10:41 AM  Between 7am to 6pm - Pager - [phone] After 6pm go to [email or web address] - password [email or web address]  Office  [phone]  CC: Primary care physician; Rusty Aus, MD

## 2017-03-06 NOTE — Progress Notes (Signed)
Manual pressure being held to left upper chest implant site by AStewart. Small spot of faint drainage noted on dressing. Dr. Lovena Le paged and made aware. Will place pressure dressing over site

## 2017-03-06 NOTE — Care Management Obs Status (Signed)
[facility] NOTIFICATION   Patient Details  Name: Christopher Daniels MRN: [id number] Date of Birth: 10-21-1934   Medicare Observation Status Notification Given:  Yes Notice signed, one given to patient and the other to HIM for scanning   Katrina Stack, RN 03/06/2017, 10:12 AM

## 2017-03-06 NOTE — Care Management Note (Signed)
Case Management Note  Patient Details  Name: IOAN LANDINI MRN: [id number] Date of Birth: [birth year]  Subjective/Objective:                 Placed in observation for chest pain.  Troponins are negative. cardiology consult pending   Action/Plan:   Expected Discharge Date:                  Expected Discharge Plan:     In-House Referral:     Discharge planning Services     Post Acute Care Choice:    Choice offered to:     DME Arranged:    DME Agency:     HH Arranged:    HH Agency:     Status of Service:     If discussed at [employer], dates discussed:    Additional Comments:  Katrina Stack, RN 03/06/2017, 9:06 AM

## 2017-03-07 ENCOUNTER — Inpatient Hospital Stay (HOSPITAL_COMMUNITY): Payer: PPO

## 2017-03-07 ENCOUNTER — Encounter (HOSPITAL_COMMUNITY): Payer: Self-pay | Admitting: Internal Medicine

## 2017-03-07 ENCOUNTER — Telehealth: Payer: Self-pay | Admitting: Physician Assistant

## 2017-03-07 DIAGNOSIS — I441 Atrioventricular block, second degree: Secondary | ICD-10-CM | POA: Diagnosis not present

## 2017-03-07 DIAGNOSIS — K219 Gastro-esophageal reflux disease without esophagitis: Secondary | ICD-10-CM | POA: Diagnosis not present

## 2017-03-07 DIAGNOSIS — Z7982 Long term (current) use of aspirin: Secondary | ICD-10-CM | POA: Diagnosis not present

## 2017-03-07 DIAGNOSIS — I2510 Atherosclerotic heart disease of native coronary artery without angina pectoris: Secondary | ICD-10-CM | POA: Diagnosis not present

## 2017-03-07 DIAGNOSIS — I447 Left bundle-branch block, unspecified: Secondary | ICD-10-CM | POA: Diagnosis not present

## 2017-03-07 DIAGNOSIS — Z950 Presence of cardiac pacemaker: Secondary | ICD-10-CM | POA: Diagnosis not present

## 2017-03-07 DIAGNOSIS — Z85828 Personal history of other malignant neoplasm of skin: Secondary | ICD-10-CM | POA: Diagnosis not present

## 2017-03-07 DIAGNOSIS — I5023 Acute on chronic systolic (congestive) heart failure: Secondary | ICD-10-CM | POA: Diagnosis not present

## 2017-03-07 DIAGNOSIS — I252 Old myocardial infarction: Secondary | ICD-10-CM | POA: Diagnosis not present

## 2017-03-07 DIAGNOSIS — Z87891 Personal history of nicotine dependence: Secondary | ICD-10-CM | POA: Diagnosis not present

## 2017-03-07 DIAGNOSIS — Z79899 Other long term (current) drug therapy: Secondary | ICD-10-CM | POA: Diagnosis not present

## 2017-03-07 DIAGNOSIS — Z955 Presence of coronary angioplasty implant and graft: Secondary | ICD-10-CM | POA: Diagnosis not present

## 2017-03-07 DIAGNOSIS — I255 Ischemic cardiomyopathy: Secondary | ICD-10-CM | POA: Diagnosis not present

## 2017-03-07 DIAGNOSIS — I495 Sick sinus syndrome: Secondary | ICD-10-CM | POA: Diagnosis not present

## 2017-03-07 DIAGNOSIS — R079 Chest pain, unspecified: Secondary | ICD-10-CM | POA: Diagnosis not present

## 2017-03-07 LAB — BASIC METABOLIC PANEL
ANION GAP: 8 (ref 5–15)
BUN: 10 mg/dL (ref 6–20)
CALCIUM: 8.5 mg/dL — AB (ref 8.9–10.3)
CO2: 24 mmol/L (ref 22–32)
Chloride: 106 mmol/L (ref 101–111)
Creatinine, Ser: 0.9 mg/dL (ref 0.61–1.24)
GFR calc Af Amer: >60 mL/min (ref >60)
GLUCOSE: 86 mg/dL (ref 65–99)
Potassium: 3.8 mmol/L (ref 3.5–5.1)
SODIUM: 138 mmol/L (ref 135–145)

## 2017-03-07 MED ORDER — CARVEDILOL 3.125 MG PO TABS: 3.1250 mg | ORAL_TABLET | Freq: Two times a day (BID) | ORAL | 11 refills | Status: DC

## 2017-03-07 NOTE — Consult Note (Addendum)
   [facility] CM Inpatient Consult   03/07/2017  MAYSEN SUDOL November 16, 1934 [id number]   Patient screened for potential [facility] Care Management program services.  Spoke with Mr. Guimond prior to hospital discharge to discuss re-engagement with [facility] Management program.  Mr. Cassar politely declines [facility] Management services. He states " I have everything covered between my doctors and I have been doing a lot better since my ICD".   Accepted another [facility] Management brochure with contact information and 24-hr nurse advice line magnet.  Appreciative of visit.   Made inpatient RNCM aware of above notes.   Marthenia Rolling, MSN-Ed, RN,BSN [facility] Liaison [phone]

## 2017-03-07 NOTE — Discharge Summary (Signed)
ELECTROPHYSIOLOGY PROCEDURE DISCHARGE SUMMARY    Patient ID: Christopher Daniels,  MRN: [id number], DOB/AGE: 03-20-1934 83 y.o.  Admit date: 03/06/2017 Discharge date: 03/07/2017  Primary Care Physician: Rusty Aus, MD  Primary Cardiologist/Electrophysiologist: Dr. Caryl Comes  Primary Discharge Diagnosis:  1. CP 2. High degree AB block  Secondary Discharge Diagnosis:  1. CAD 2. ICM, chronic CHF   Allergies  Allergen Reactions  . Niaspan [Niacin Er] Anaphylaxis and Other (See Comments)    Episode happened 10 to 15 years ago.  Has not tried any other similar meds since  . Adhesive [Tape] Other (See Comments)    Skin is very sensitive; PLEASE USE PAPER TAPE!!     Procedures This Admission:  1.  Implantation of a SJM, CRT-P on 03/06/17 by Dr Lovena Le.  The patient received [place] [id number] (serial number [id number]) right atrial lead and a [place] [id number] (serial number [id number]) right ventricular lead, St. Jude Guide and advanced into the lateral vein. A St. Jude(serial number [id number]) LV lead There were no immediate post procedure complications. 2.  CXR on 03/07/17 demonstrated no pneumothorax status post device implantation.   Brief HPI: Christopher Daniels is a 83 y.o. male was admitted to [facility] yesterday with c/o chest painand palpitations, he was found in 2:1 AVBlock, admitted and then transferred to [facility] for PPM implant.  Hospital Course:  The patient has PMHx of CAD s/p inf STEMI w/ staged PCI/DES to the RCA and LAD in 01/2016, patent stents on cath 10/2016, ICM/HFrEF w/ EF of 36% by cardiac MRI 05/2016, sinus brady, 1st deg avb, LBBB, and GERD, his last coronary w/u was with an inf MI s/p RCA stenting in 01/2016 w/ subsequent staged PCI/DES to the LAD, He was left w/ LV dysfxn and an EF of 30-35% by echo in 01/2016. A LifeVest was initially placed. F/u echo in 04/2016 revealed an EF of 36%,He was seen by Dr. Caryl Comes following MRI and LifeVest was  discontinued. Med rx was maximized. In 06/2016, Cardiac MRI was performed and corroborated echo finding (EF 36%) w/ apical AK, mod diff HK, and septal-lateral dyssynchrony. At f/u in 07/2016, ?blocker was d/'c'd in the setting of bradycardia (38) w/ 1st deg AVB. Lisinopril was also d/c'd in favor of entresto however, he was not able to tolerate entresto 2/2 relative hypotension and lightheadedness, therefor, lisinopril was resumed in late 07/2016.  In 10/2016 w/ chest pain and underwent cath revealing patent LAD/RCA stents w/ a jailed D2 w/ ostial 70% stenosis. Med Rx was recommended.  He did well until the day of admission, with symptoms as noted.   His Troponins were all wnl, labs unremarkable, and transferred to [facility] yesterday afternoon having CRT-P placed with details as outlined above.  He was monitored on telemetry overnight which demonstrated AV pacing.  Left chest was noted small hematoma, his Kary Kos is being held (he is over 1 year since last intervention) until Monday.  The device was interrogated and found to be functioning normally.  CXR was obtained and demonstrated no pneumothorax status post device implantation.  Wound care, arm mobility, and restrictions were reviewed with the patient.  The patient was examined by Dr. Lovena Le and considered stable for discharge to home.  Will resume his carvedilol now s/p PPM implant   Physical Exam: Vitals:   03/06/17 2306 03/06/17 2352 03/07/17 0006 03/07/17 0447  BP: 113/65 116/63 (!) 152/95 (!) 116/52  Pulse:   79 60  Resp: 13  15 20 16   Temp:   98.5 F (36.9 C) 98.5 F (36.9 C)  TempSrc:   Oral Oral  SpO2:   100% 99%  Weight:   164 lb 14.5 oz (74.8 kg) 177 lb 7.5 oz (80.5 kg)  Height:   5\' 11"  (1.803 m)     GEN- The patient is well appearing, alert and oriented x 3 today.   HEENT: normocephalic, atraumatic; sclera clear, conjunctiva pink; hearing intact; oropharynx clear; neck supple, no JVP Lungs-  CTA b/l, normal work of breathing.  No  wheezes, rales, rhonchi Heart-  RRR, no murmurs, rubs or gallops, PMI not laterally displaced GI- soft, non-tender, non-distended Extremities- no clubbing, cyanosis, or edema MS- no significant deformity or atrophy Skin- warm and dry, no rash or lesion,  left chest without remainig hematoma, + minimal ecchymosis Psych- euthymic mood, full affect Neuro- no gross deficits   Labs:   Lab Results  Component Value Date   WBC 11.3 (H) 03/05/2017   HGB 13.5 03/05/2017   HCT 40.3 03/05/2017   MCV 95.6 03/05/2017   PLT 221 03/05/2017    Recent Labs  Lab 03/07/17 0204  NA 138  K 3.8  CL 106  CO2 24  BUN 10  CREATININE 0.90  CALCIUM 8.5*  GLUCOSE 86    Discharge Medications:  Allergies as of 03/07/2017      Reactions   Niaspan [niacin Er] Anaphylaxis, Other (See Comments)   Episode happened 10 to 15 years ago.  Has not tried any other similar meds since   Adhesive [tape] Other (See Comments)   Skin is very sensitive; PLEASE USE PAPER TAPE!!      Medication List    TAKE these medications   aspirin 81 MG tablet Take 81 mg by mouth daily.   atorvastatin 80 MG tablet Commonly known as:  LIPITOR Take 1 tablet (80 mg total) by mouth every evening.   B-12 500 MCG Subl Place 500 mcg under the tongue daily.   carvedilol 3.125 MG tablet Commonly known as:  COREG Take 1 tablet (3.125 mg total) by mouth 2 (two) times daily.   co-enzyme Q-10 30 MG capsule Take 30 mg by mouth daily.   Co Q 10 10 MG Caps Take 10 mg by mouth daily.   lisinopril 5 MG tablet Commonly known as:  PRINIVIL,ZESTRIL Take 1 tablet (5 mg total) by mouth daily.   nitroGLYCERIN 0.4 MG SL tablet Commonly known as:  NITROSTAT Place 1 tablet (0.4 mg total) under the tongue every 5 (five) minutes x 3 doses as needed for chest pain.   pantoprazole 40 MG tablet Commonly known as:  PROTONIX Take 40 mg by mouth 2 (two) times daily.   REFRESH TEARS 0.5 % Soln Generic drug:  carboxymethylcellulose Place  1 drop into both eyes 3 (three) times daily as needed (for dry eyes).   spironolactone 25 MG tablet Commonly known as:  ALDACTONE Take 0.5 tablets (12.5 mg total) by mouth daily.   ticagrelor 90 MG Tabs tablet Commonly known as:  BRILINTA Take 1 tablet (90 mg total) by mouth 2 (two) times daily. Please keep upcoming appointment for further refills. What changed:  additional instructions Notes to patient:  Resume on 1/ 14/19   VITRON-C 65-125 MG Tabs Generic drug:  Iron-Vitamin C Take 1 tablet by mouth 2 (two) times daily.       Disposition:  Home Discharge Instructions    Diet - low sodium heart healthy   Complete by:  As directed    Increase activity slowly   Complete by:  As directed      Follow-up Information    [place] Office Follow up on 03/20/2017.   Specialty:  Cardiology Why:  9:30AM, wound check visit Contact information: [address], [address] [place]       Deboraha Sprang, MD Follow up on 03/07/2017.   Specialty:  Cardiology Why:  You will be called by Dr. Olin Pia scheduler to make your 3 month follow up appointment Contact information: [place] [address] [phone]           Duration of Discharge Encounter: Greater than 30 minutes including physician time.   Venetia Night, PA-C 03/07/2017 9:21 AM  EP Attending  Patient seen and examined. Agree with above. He is stable for DC home. Usual followup.   Mikle Bosworth.D.

## 2017-03-07 NOTE — Care Management CC44 (Signed)
Condition Code 44 Documentation Completed  Patient Details  Name: Christopher Daniels MRN: [id number] Date of Birth: 1934/10/15   Condition Code 44 given:  Yes Patient signature on Condition Code 44 notice:  Yes Documentation of 2 MD's agreement:  Yes Code 44 added to claim:  Yes    Bethena Roys, RN 03/07/2017, 10:01 AM

## 2017-03-07 NOTE — Care Management Obs Status (Signed)
MEDICARE OBSERVATION STATUS NOTIFICATION   Patient Details  Name: Christopher Daniels MRN: [id number] Date of Birth: 1934/04/08   Medicare Observation Status Notification Given:  Yes    Bethena Roys, RN 03/07/2017, 10:01 AM

## 2017-03-07 NOTE — Discharge Instructions (Signed)
° ° °  Supplemental Discharge Instructions for  Pacemaker/Defibrillator Patients  Activity No heavy lifting or vigorous activity with your left/right arm for 6 to 8 weeks.  Do not raise your left/right arm above your head for one week.  Gradually raise your affected arm as drawn below.             03/07/17                     03/08/17                    03/09/17                   03/10/17 __  NO DRIVING for  1 week  ; you may begin driving on  09/26/63   .  WOUND CARE - Keep the wound area clean and dry.  Do not get this area wet for one week. No showers for one week; you may shower on 03/10/17  . - The tape/steri-strips on your wound will fall off; do not pull them off.  No bandage is needed on the site.  DO  NOT apply any creams, oils, or ointments to the wound area. - If you notice any drainage or discharge from the wound, any swelling or bruising at the site, or you develop a fever > 101? F after you are discharged home, call the office at once.  Special Instructions - You are still able to use cellular telephones; use the ear opposite the side where you have your pacemaker/defibrillator.  Avoid carrying your cellular phone near your device. - When traveling through airports, show security personnel your identification card to avoid being screened in the metal detectors.  Ask the security personnel to use the hand wand. - Avoid arc welding equipment, MRI testing (magnetic resonance imaging), TENS units (transcutaneous nerve stimulators).  Call the office for questions about other devices. - Avoid electrical appliances that are in poor condition or are not properly grounded. - Microwave ovens are safe to be near or to operate.  Additional information for defibrillator patients should your device go off: - If your device goes off ONCE and you feel fine afterward, notify the device clinic nurses. - If your device goes off ONCE and you do not feel well afterward, call 911. - If your device goes  off TWICE, call 911. - If your device goes off THREE times in one day, call 911.  DO NOT DRIVE YOURSELF OR A FAMILY MEMBER WITH A DEFIBRILLATOR TO THE HOSPITAL--CALL 911.

## 2017-03-07 NOTE — Telephone Encounter (Signed)
Noted on the patient's discharge instruction dates for arm restrictions, driving, showering were incorrect.  I called the patient, he mentioned he had noticed that it was different then we discussed prior to discharge.  I went through with him on the phone, and he stated he would pencil in the correct the dates and is clear, not to start with the arm activity as the pictures shown until the 14th, and no driving or showering until the 17th.    Encouraged him to call if any questions or concerns.  Tommye Standard, PA-C

## 2017-03-10 ENCOUNTER — Telehealth: Payer: Self-pay | Admitting: Interventional Cardiology

## 2017-03-10 NOTE — Telephone Encounter (Signed)
New Message   1. Has your device fired? no  2. Is you device beeping? no  3. Are you experiencing draining or swelling at device site? no  4. Are you calling to see if we received your device transmission? no  5. Have you passed out? no  Patients concern is that the site where the pacemaker was inputted is very hard. He just wants to discuss this with someone. Please call.   Please route to [place]

## 2017-03-10 NOTE — Telephone Encounter (Signed)
Spoke with pt, pt stated that site was not draining, asked pt if site was puffy pt stated no that the site was raised about half of an inch around the incision was hard, informed pt that was likely the device he was feeling since the site was not puffy or swollen, pt stated that site did not look any different today than when Dr. Lovena Le seen in on Friday. Informed pt that what he was feeling was normal pt voiced understanding.

## 2017-03-20 ENCOUNTER — Ambulatory Visit (INDEPENDENT_AMBULATORY_CARE_PROVIDER_SITE_OTHER): Payer: PPO | Admitting: *Deleted

## 2017-03-20 DIAGNOSIS — I495 Sick sinus syndrome: Secondary | ICD-10-CM | POA: Diagnosis not present

## 2017-03-20 LAB — CUP PACEART INCLINIC DEVICE CHECK
Battery Voltage: 2.98 V
Brady Statistic RA Percent Paced: 82 %
Implantable Lead Implant Date: 20190110
Implantable Lead Location: 753859
Lead Channel Impedance Value: 637.5 Ohm
Lead Channel Impedance Value: 650 Ohm
Lead Channel Pacing Threshold Amplitude: 0.5 V
Lead Channel Pacing Threshold Pulse Width: 0.5 ms
Lead Channel Pacing Threshold Pulse Width: 0.5 ms
Lead Channel Sensing Intrinsic Amplitude: 12 mV
Lead Channel Setting Pacing Amplitude: 3.5 V
Lead Channel Setting Pacing Amplitude: 3.5 V
Lead Channel Setting Pacing Pulse Width: 0.5 ms
Lead Channel Setting Sensing Sensitivity: 2 mV
MDC IDC LEAD IMPLANT DT: 20190110
MDC IDC LEAD IMPLANT DT: 20190110
MDC IDC LEAD LOCATION: 753858
MDC IDC LEAD LOCATION: 753860
MDC IDC MSMT BATTERY REMAINING LONGEVITY: 43 mo
MDC IDC MSMT LEADCHNL LV PACING THRESHOLD AMPLITUDE: 1 V
MDC IDC MSMT LEADCHNL RA IMPEDANCE VALUE: 475 Ohm
MDC IDC MSMT LEADCHNL RA PACING THRESHOLD AMPLITUDE: 0.75 V
MDC IDC MSMT LEADCHNL RA SENSING INTR AMPL: 2.2 mV
MDC IDC MSMT LEADCHNL RV PACING THRESHOLD PULSEWIDTH: 0.5 ms
MDC IDC PG IMPLANT DT: 20190110
MDC IDC SESS DTM: 20190124114200
MDC IDC SET LEADCHNL LV PACING AMPLITUDE: 3.5 V
MDC IDC SET LEADCHNL LV PACING PULSEWIDTH: 0.5 ms
MDC IDC STAT BRADY RV PERCENT PACED: 98 %
Pulse Gen Model: 3262
Pulse Gen Serial Number: 8942222

## 2017-03-20 NOTE — Progress Notes (Signed)
Wound check appointment. Steri-strips removed. Wound without redness. No drainage. Slight edema, soft to palpated, yellow bruising on chest and down left side. Patient and wife educated on signs and symptoms of active bleeding. Incision edges approximated, wound well healed.   Normal device function. Thresholds, sensing, and impedances consistent with implant measurements. Device programmed at 3.5V for extra safety margin until 3 month visit. Histogram distribution appropriate for patient and level of activity. 5 mode switches (<1%)+Brilinta-- EGMs appear AT, longest 6 seconds, Peak A rate 175 bpm, Peak V rate 76 bpm. No high ventricular rates noted. Patient educated about wound care, arm mobility, lifting restrictions. ROV with SK 06/10/17.

## 2017-03-25 ENCOUNTER — Ambulatory Visit: Payer: PPO | Admitting: Internal Medicine

## 2017-03-27 DIAGNOSIS — Z961 Presence of intraocular lens: Secondary | ICD-10-CM | POA: Diagnosis not present

## 2017-04-21 ENCOUNTER — Encounter: Payer: Self-pay | Admitting: Internal Medicine

## 2017-04-21 NOTE — Telephone Encounter (Signed)
   Primary Cardiologist:Steven Caryl Comes, MD  Chart reviewed as part of pre-operative protocol coverage. Because of Christopher Daniels's past medical history and time since last visit, he/she will require a follow-up visit in order to better assess preoperative cardiovascular risk.  Patient was to be seen in January of 2019.   Pre-op covering staff: - Please schedule appointment and call patient to inform them. - Please contact requesting surgeon's office via preferred method (i.e, phone, fax) to inform them of need for appointment prior to surgery.  Truitt Merle, NP  04/21/2017, 2:56 PM

## 2017-04-21 NOTE — Telephone Encounter (Signed)
   [facility] Pre-operative Risk Assessment    Request for surgical clearance:  1. What type of surgery is being performed? CLEANING,EXTRACTION AND CROWN  2. When is this surgery scheduled? NOT YET LATE MARCH OR EARLY APRIL  3. What type of clearance is required (medical clearance vs. Pharmacy clearance to hold med vs. Both)? BOTH   4. Are there any medications that need to be held prior to surgery and how long?  ??  ASA 5. BRILINTA   6. Practice name and name of physician performing surgery? [facility]   [place] 7. What is your office phone and fax number?  1 [phone] PHONE [phone] FAX NUMBER 8. Anesthesia type (None, local, MAC, general) ?  NOT LISTED   Christopher Daniels 04/21/2017, 2:30 PM  _________________________________________________________________   (provider comments below)

## 2017-04-25 NOTE — Telephone Encounter (Signed)
Request for appointment routed to Christopher Daniels to schedule with Dr. Caryl Comes or EP APP

## 2017-04-25 NOTE — Telephone Encounter (Signed)
A message has been sent to patient with update on status of his request

## 2017-05-03 ENCOUNTER — Other Ambulatory Visit: Payer: Self-pay | Admitting: Internal Medicine

## 2017-05-08 ENCOUNTER — Encounter: Payer: PPO | Admitting: Physician Assistant

## 2017-05-21 DIAGNOSIS — D500 Iron deficiency anemia secondary to blood loss (chronic): Secondary | ICD-10-CM | POA: Diagnosis not present

## 2017-05-24 ENCOUNTER — Other Ambulatory Visit: Payer: Self-pay | Admitting: Internal Medicine

## 2017-06-06 ENCOUNTER — Ambulatory Visit: Payer: PPO | Admitting: Internal Medicine

## 2017-06-09 DIAGNOSIS — M79671 Pain in right foot: Secondary | ICD-10-CM | POA: Diagnosis not present

## 2017-06-09 DIAGNOSIS — D2372 Other benign neoplasm of skin of left lower limb, including hip: Secondary | ICD-10-CM | POA: Diagnosis not present

## 2017-06-09 DIAGNOSIS — M79672 Pain in left foot: Secondary | ICD-10-CM | POA: Diagnosis not present

## 2017-06-09 NOTE — Progress Notes (Signed)
[place]  [phone] [phone]  ID: Christopher Daniels OB: 1934-11-05  MR#: [id number]  GHW#:[id number]  Patient Care Team: Rusty Aus, MD as PCP - General (Internal Medicine) Deboraha Sprang, MD as PCP - Cardiology (Cardiology)  CHIEF COMPLAINT: CLL  INTERVAL HISTORY: Patient returns to clinic today for repeat laboratory work and routine yearly evaluation.  He currently feels well and is asymptomatic.  He denies any recent fevers or illnesses.  He denies any night sweats.  He has a good appetite and denies weight loss.  He has no neurologic complaints.  He denies any chest pain, cough, hemoptysis, or shortness of breath.  He denies any nausea, vomiting, constipation, or diarrhea.  He has no urinary complaints.  Patient offers no specific complaints today.  REVIEW OF SYSTEMS:   Review of Systems  Constitutional: Negative.  Negative for fever, malaise/fatigue and weight loss.  Respiratory: Negative.  Negative for cough and shortness of breath.   Cardiovascular: Negative.  Negative for chest pain and leg swelling.  Gastrointestinal: Negative.  Negative for abdominal pain and constipation.  Genitourinary: Negative.  Negative for dysuria.  Musculoskeletal: Negative.  Negative for back pain.  Skin: Negative.  Negative for rash.  Neurological: Negative.  Negative for sensory change, focal weakness and weakness.  Endo/Heme/Allergies: Does not bruise/bleed easily.  Psychiatric/Behavioral: Negative.  The patient is not nervous/anxious.     As per HPI. Otherwise, a complete review of systems is negative.  PAST MEDICAL HISTORY: Past Medical History:  Diagnosis Date  . Arthritis    "joints; fingers, knees" (03/06/2017)  . CAD (coronary artery disease)    a. 01/2016 Inf STEMI->PCI/Thrombectomy/DES to mid RCA (4.0 x 30 Resolute DES), EF 25-30%; b. 01/2016 Staged PCI/DES to mLAD (3.0x22 Resolute DES); c. 10/2016 Cath: LM nl, LAD patent stent, 30d, D2  70, LCX nl, OM1/2 nl, RCA patent mid/dist stent.  . CLL (chronic lymphocytic leukemia) ([place])    "never taken any RX; just monitor this twice/year" (03/06/2017)  . Diverticulitis   . GERD (gastroesophageal reflux disease)   . H/O Bell's palsy 1960s  . HFrEF (heart failure with reduced ejection fraction) ([place])   . High cholesterol   . History of kidney stones   . HOH (hard of hearing)   . Iron deficiency anemia   . Ischemic cardiomyopathy    a. 01/2016 EF 25-30%; b. 04/2016 f/u echo: EF 36%, mild glob HK, basal and mid inflat, inf, infsept, mid apical inf AK, mildly dil LA/RA, mild AI/MR, trace TR; c. 06/2016 Cardiac MRI: EF 36%, apical AK, mod diff HK, septal-lateral dyssynchrony  . LBBB (left bundle branch block)   . Mobitz type 2 second degree AV block 03/06/2017  . Myocardial infarction ([place]) 01/27/2016  . Presence of permanent cardiac pacemaker   . Sinus bradycardia    a. Rates freq in the 40's - asymptomatic.  . Skin cancer    on head years ago    PAST SURGICAL HISTORY: Past Surgical History:  Procedure Laterality Date  . BACK SURGERY    . BI-VENTRICULAR PACEMAKER INSERTION (CRT-P)  03/06/2017  . BIV PACEMAKER INSERTION CRT-P N/A 03/06/2017   Procedure: BIV PACEMAKER INSERTION CRT-P;  Surgeon: Evans Lance, MD;  Location: [place] [facility];  Service: Cardiovascular;  Laterality: N/A;  . CARDIAC CATHETERIZATION N/A 07/29/2014   Procedure: Left Heart Cath;  Surgeon: Teodoro Spray, MD;  Location: [place] [facility];  Service: Cardiovascular;  Laterality: N/A;  . CARDIAC CATHETERIZATION N/A  01/27/2016   Procedure: Left Heart Cath and Coronary Angiography;  Surgeon: [name], MD;  Location: [place] [facility];  Service: Cardiovascular;  Laterality: N/A;  . CARDIAC CATHETERIZATION N/A 01/27/2016   Procedure: Coronary Stent Intervention;  Surgeon: [name], MD;  Location: [place] [facility];  Service: Cardiovascular;  Laterality: N/A;  . CARDIAC CATHETERIZATION N/A 02/13/2016    Procedure: Coronary Stent Intervention;  Surgeon: [name], MD;  Location: [place] [facility];  Service: Cardiovascular;  Laterality: N/A;  . CARDIAC CATHETERIZATION N/A 02/13/2016   Procedure: Left Heart Cath and Coronary Angiography;  Surgeon: [name], MD;  Location: [place] [facility];  Service: Cardiovascular;  Laterality: N/A;  . CARDIAC CATHETERIZATION    . CATARACT EXTRACTION W/PHACO Right 07/18/2014   Procedure: CATARACT EXTRACTION PHACO AND INTRAOCULAR LENS PLACEMENT (IOC);  Surgeon: Estill Cotta, MD;  Location: [facility];  Service: Ophthalmology;  Laterality: Right;  [place] 03:11 AP% 28.4 CDE 75.64  . CATARACT EXTRACTION W/PHACO Left 06/19/2015   Procedure: CATARACT EXTRACTION PHACO AND INTRAOCULAR LENS PLACEMENT (IOC);  Surgeon: Estill Cotta, MD;  Location: [facility];  Service: Ophthalmology;  Laterality: Left;  [place] 02:36.5 AP% 27.7 CDE 69.51 fluid pack lot # [id number]  . INGUINAL HERNIA REPAIR Left 2010  . LEFT HEART CATH AND CORONARY ANGIOGRAPHY N/A 11/18/2016   Procedure: LEFT HEART CATH AND CORONARY ANGIOGRAPHY;  Surgeon: Wellington Hampshire, MD;  Location: [place] [facility];  Service: Cardiovascular;  Laterality: N/A;  . LUMBAR [place]   "ruptured disc; took a piece off my buttocks and fixed it"  . SKIN CANCER EXCISION     "top of my head"  . TONSILLECTOMY      FAMILY HISTORY: Reviewed and unchanged. No reported history of malignancy or chronic disease.     ADVANCED DIRECTIVES:    HEALTH MAINTENANCE: Social History   Tobacco Use  . Smoking status: Former Smoker    Years: 4.00    Types: Pipe, Cigars    Last attempt to quit: 1960    Years since quitting: 59.3  . Smokeless tobacco: Former Systems developer    Types: Chew    Quit date: 1960  Substance Use Topics  . Alcohol use: No  . Drug use: No     Colonoscopy:  PAP:  Bone density:  Lipid panel:  Allergies  Allergen Reactions  . Niaspan [Niacin Er] Anaphylaxis and Other (See Comments)    Episode  happened 10 to 15 years ago.  Has not tried any other similar meds since  . Adhesive [Tape] Other (See Comments)    Skin is very sensitive; PLEASE USE PAPER TAPE!!    Current Outpatient Medications  Medication Sig Dispense Refill  . aspirin 81 MG tablet Take 81 mg by mouth daily.     Marland Kitchen atorvastatin (LIPITOR) 80 MG tablet Take 1 tablet (80 mg total) by mouth every evening. 30 tablet 3  . BRILINTA 90 MG TABS tablet TAKE ONE TABLET BY MOUTH TWICE DAILY -PLEASE KEEP UPCOMING APPT FOR REFILLS 60 tablet 1  . carvedilol (COREG) 3.125 MG tablet Take 1 tablet (3.125 mg total) by mouth 2 (two) times daily. 60 tablet 11  . co-enzyme Q-10 30 MG capsule Take 30 mg by mouth daily.    . Cyanocobalamin (B-12) 500 MCG SUBL Place 500 mcg under the tongue every other day.     . Iron-Vitamin C (VITRON-C) 65-125 MG TABS Take 1 tablet by mouth 2 (two) times daily.    Marland Kitchen lisinopril (  PRINIVIL,ZESTRIL) 5 MG tablet Take 1 tablet (5 mg total) by mouth daily.    . pantoprazole (PROTONIX) 40 MG tablet Take 40 mg by mouth 2 (two) times daily.     Marland Kitchen spironolactone (ALDACTONE) 25 MG tablet TAKE ONE-HALF TABLET BY MOUTH DAILY 45 tablet 0  . carboxymethylcellulose (REFRESH TEARS) 0.5 % SOLN Place 1 drop into both eyes 3 (three) times daily as needed (for dry eyes).     . nitroGLYCERIN (NITROSTAT) 0.4 MG SL tablet Place 1 tablet (0.4 mg total) under the tongue every 5 (five) minutes x 3 doses as needed for chest pain. (Patient not taking: Reported on 06/12/2017) 25 tablet 3   No current facility-administered medications for this visit.     OBJECTIVE: Vitals:   06/12/17 1045  BP: 92/60  Pulse: 60  Resp: 18  Temp: 97.8 F (36.6 C)  SpO2: 99%     Body mass index is 25.69 kg/m.    ECOG FS:0 - Asymptomatic  General: Well-developed, well-nourished, no acute distress. Eyes: Pink conjunctiva, anicteric sclera. HEENT: Normocephalic, moist mucous membranes, clear oropharnyx. Lungs: Clear to auscultation bilaterally. Heart:  Regular rate and rhythm. No rubs, murmurs, or gallops. Abdomen: Soft, nontender, nondistended. No organomegaly noted, normoactive bowel sounds. Musculoskeletal: No edema, cyanosis, or clubbing. Neuro: Alert, answering all questions appropriately. Cranial nerves grossly intact. Skin: No rashes or petechiae noted. Psych: Normal affect. Lymphatics: No cervical, calvicular, axillary or inguinal LAD.   LAB RESULTS:  Lab Results  Component Value Date   NA 138 03/07/2017   K 3.8 03/07/2017   CL 106 03/07/2017   CO2 24 03/07/2017   GLUCOSE 86 03/07/2017   BUN 10 03/07/2017   CREATININE 0.90 03/07/2017   CALCIUM 8.5 (L) 03/07/2017   PROT 5.6 (L) 01/27/2016   ALBUMIN 3.4 (L) 01/27/2016   AST 21 01/27/2016   ALT 17 01/27/2016   ALKPHOS 53 01/27/2016   BILITOT 0.7 01/27/2016   GFRNONAA >60 03/07/2017   GFRAA >60 03/07/2017    Lab Results  Component Value Date   WBC 12.7 (H) 06/12/2017   NEUTROABS 3.0 06/12/2017   HGB 11.9 (L) 06/12/2017   HCT 35.2 (L) 06/12/2017   MCV 97.7 06/12/2017   PLT 222 06/12/2017     STUDIES: No results found.  ASSESSMENT: CLL, Rai stage 0  PLAN:    1.  CLL: Patient's white blood cell count is only mildly elevated, but at his baseline. CT in August 2014 was reviewed independently and did not reveal any underlying lymphadenopathy.  This does not need to be repeated unless there is concern of progression of disease.  No intervention is needed at this time.  Patient does not require bone marrow biopsy.  Return to clinic in 6 months with laboratory work only and then in 1 year with laboratory work and further evaluation.    Approximately 20 minutes was spent in discussion of which greater than 50% was consultation.     Patient expressed understanding and was in agreement with this plan. He also understands that He can call clinic at any time with any questions, concerns, or complaints.    Lloyd Huger, MD   06/17/2017 3:27 PM

## 2017-06-10 ENCOUNTER — Ambulatory Visit (INDEPENDENT_AMBULATORY_CARE_PROVIDER_SITE_OTHER): Payer: PPO | Admitting: Internal Medicine

## 2017-06-10 ENCOUNTER — Encounter: Payer: Self-pay | Admitting: Internal Medicine

## 2017-06-10 ENCOUNTER — Telehealth: Payer: Self-pay | Admitting: Internal Medicine

## 2017-06-10 VITALS — BP 110/62 | HR 60 | Ht 71.0 in | Wt 184.2 lb

## 2017-06-10 DIAGNOSIS — I255 Ischemic cardiomyopathy: Secondary | ICD-10-CM | POA: Diagnosis not present

## 2017-06-10 DIAGNOSIS — I447 Left bundle-branch block, unspecified: Secondary | ICD-10-CM

## 2017-06-10 DIAGNOSIS — R001 Bradycardia, unspecified: Secondary | ICD-10-CM | POA: Diagnosis not present

## 2017-06-10 DIAGNOSIS — Z950 Presence of cardiac pacemaker: Secondary | ICD-10-CM

## 2017-06-10 DIAGNOSIS — I5022 Chronic systolic (congestive) heart failure: Secondary | ICD-10-CM | POA: Diagnosis not present

## 2017-06-10 LAB — CUP PACEART INCLINIC DEVICE CHECK
Implantable Lead Implant Date: 20190110
Implantable Lead Implant Date: 20190110
Implantable Lead Location: 753858
Implantable Lead Location: 753859
Implantable Lead Location: 753860
MDC IDC LEAD IMPLANT DT: 20190110
MDC IDC PG IMPLANT DT: 20190110
MDC IDC PG SERIAL: 8942222
MDC IDC SESS DTM: 20190501083725

## 2017-06-10 NOTE — Progress Notes (Signed)
Patient Care Team: Rusty Aus, MD as PCP - General (Internal Medicine) Deboraha Sprang, MD as PCP - Cardiology (Cardiology)   HPI  Christopher Daniels is a 83 y.o. male seen in followup of sinus bradycardia; left ventricular dysfunction in the setting of a recent MI. He also has LBBB;  he is status post CRT-P implantation when he presented with higher grade heart block.  He has a history of ischemic heart disease.  17/00 STEMI complicated by complete heart block in the context of a known history of left bundle branch block; he underwent DES stenting of his "super dominant " RCA.  LVEF at that time 25-30% which he been noted previously.He has been on brilinta.   He was given a LifeVest.  He underwent staged intervention of his LAD 2 weeks later. Ejection fraction at that time was 30-35%.  He has been managed with very low-dose carvedilol as noted below and lisinopril. It also had to be down titrated during cardiac rehabilitation because of hypotension which was asymptomatic  Echocardiogram 3/18 demonstrating EF of 36%, although it described as visually 25-30. MRI scanning 5/18 EF 36%.  He is much improve following device implant.  He has less shortness of breath.  He is having no peripheral edema.  No chest pain   Tolerating meds without difficulty      Date Cr K Hgb  4/18  0.79 4.5    6/18  1.0 4.5 13.7  1/19 0.9 3.8      Past Medical History:  Diagnosis Date  . Arthritis    "joints; fingers, knees" (03/06/2017)  . CAD (coronary artery disease)    a. 01/2016 Inf STEMI->PCI/Thrombectomy/DES to mid RCA (4.0 x 30 Resolute DES), EF 25-30%; b. 01/2016 Staged PCI/DES to mLAD (3.0x22 Resolute DES); c. 10/2016 Cath: LM nl, LAD patent stent, 30d, D2 70, LCX nl, OM1/2 nl, RCA patent mid/dist stent.  . CLL (chronic lymphocytic leukemia) ([place])    "never taken any RX; just monitor this twice/year" (03/06/2017)  . Diverticulitis   . GERD (gastroesophageal reflux disease)   . H/O  Bell's palsy 1960s  . HFrEF (heart failure with reduced ejection fraction) ([place])   . High cholesterol   . History of kidney stones   . HOH (hard of hearing)   . Iron deficiency anemia   . Ischemic cardiomyopathy    a. 01/2016 EF 25-30%; b. 04/2016 f/u echo: EF 36%, mild glob HK, basal and mid inflat, inf, infsept, mid apical inf AK, mildly dil LA/RA, mild AI/MR, trace TR; c. 06/2016 Cardiac MRI: EF 36%, apical AK, mod diff HK, septal-lateral dyssynchrony  . LBBB (left bundle branch block)   . Mobitz type 2 second degree AV block 03/06/2017  . Myocardial infarction ([place]) 01/27/2016  . Presence of permanent cardiac pacemaker   . Sinus bradycardia    a. Rates freq in the 40's - asymptomatic.  . Skin cancer    on head years ago    Past Surgical History:  Procedure Laterality Date  . BACK SURGERY    . BI-VENTRICULAR PACEMAKER INSERTION (CRT-P)  03/06/2017  . BIV PACEMAKER INSERTION CRT-P N/A 03/06/2017   Procedure: BIV PACEMAKER INSERTION CRT-P;  Surgeon: Evans Lance, MD;  Location: [place] [facility];  Service: Cardiovascular;  Laterality: N/A;  . CARDIAC CATHETERIZATION N/A 07/29/2014   Procedure: Left Heart Cath;  Surgeon: Teodoro Spray, MD;  Location: [place] [facility];  Service: Cardiovascular;  Laterality: N/A;  .  CARDIAC CATHETERIZATION N/A 01/27/2016   Procedure: Left Heart Cath and Coronary Angiography;  Surgeon: [name], MD;  Location: [place] [facility];  Service: Cardiovascular;  Laterality: N/A;  . CARDIAC CATHETERIZATION N/A 01/27/2016   Procedure: Coronary Stent Intervention;  Surgeon: [name], MD;  Location: [place] [facility];  Service: Cardiovascular;  Laterality: N/A;  . CARDIAC CATHETERIZATION N/A 02/13/2016   Procedure: Coronary Stent Intervention;  Surgeon: [name], MD;  Location: [place] [facility];  Service: Cardiovascular;  Laterality: N/A;  . CARDIAC CATHETERIZATION N/A 02/13/2016   Procedure: Left Heart Cath and Coronary Angiography;  Surgeon: [name],  MD;  Location: [place] [facility];  Service: Cardiovascular;  Laterality: N/A;  . CARDIAC CATHETERIZATION    . CATARACT EXTRACTION W/PHACO Right 07/18/2014   Procedure: CATARACT EXTRACTION PHACO AND INTRAOCULAR LENS PLACEMENT (IOC);  Surgeon: Estill Cotta, MD;  Location: [facility];  Service: Ophthalmology;  Laterality: Right;  [place] 03:11 AP% 28.4 CDE 75.64  . CATARACT EXTRACTION W/PHACO Left 06/19/2015   Procedure: CATARACT EXTRACTION PHACO AND INTRAOCULAR LENS PLACEMENT (IOC);  Surgeon: Estill Cotta, MD;  Location: [facility];  Service: Ophthalmology;  Laterality: Left;  [place] 02:36.5 AP% 27.7 CDE 69.51 fluid pack lot # [id number]  . INGUINAL HERNIA REPAIR Left 2010  . LEFT HEART CATH AND CORONARY ANGIOGRAPHY N/A 11/18/2016   Procedure: LEFT HEART CATH AND CORONARY ANGIOGRAPHY;  Surgeon: Wellington Hampshire, MD;  Location: [place] [facility];  Service: Cardiovascular;  Laterality: N/A;  . LUMBAR [place]   "ruptured disc; took a piece off my buttocks and fixed it"  . SKIN CANCER EXCISION     "top of my head"  . TONSILLECTOMY      Current Outpatient Medications  Medication Sig Dispense Refill  . aspirin 81 MG tablet Take 81 mg by mouth daily.     Marland Kitchen atorvastatin (LIPITOR) 80 MG tablet Take 1 tablet (80 mg total) by mouth every evening. 30 tablet 3  . BRILINTA 90 MG TABS tablet TAKE ONE TABLET BY MOUTH TWICE DAILY -PLEASE KEEP UPCOMING APPT FOR REFILLS 60 tablet 1  . carboxymethylcellulose (REFRESH TEARS) 0.5 % SOLN Place 1 drop into both eyes 3 (three) times daily as needed (for dry eyes).     . carvedilol (COREG) 3.125 MG tablet Take 1 tablet (3.125 mg total) by mouth 2 (two) times daily. 60 tablet 11  . co-enzyme Q-10 30 MG capsule Take 30 mg by mouth daily.    . Cyanocobalamin (B-12) 500 MCG SUBL Place 500 mcg under the tongue every other day.     . Iron-Vitamin C (VITRON-C) 65-125 MG TABS Take 1 tablet by mouth 2 (two) times daily.    Marland Kitchen lisinopril (PRINIVIL,ZESTRIL) 5 MG  tablet Take 1 tablet (5 mg total) by mouth daily.    . nitroGLYCERIN (NITROSTAT) 0.4 MG SL tablet Place 1 tablet (0.4 mg total) under the tongue every 5 (five) minutes x 3 doses as needed for chest pain. 25 tablet 3  . pantoprazole (PROTONIX) 40 MG tablet Take 40 mg by mouth 2 (two) times daily.     Marland Kitchen spironolactone (ALDACTONE) 25 MG tablet TAKE ONE-HALF TABLET BY MOUTH DAILY 45 tablet 0   No current facility-administered medications for this visit.     Allergies  Allergen Reactions  . Niaspan [Niacin Er] Anaphylaxis and Other (See Comments)    Episode happened 10 to 15 years ago.  Has not tried any other similar meds since  . Adhesive [Tape]  Other (See Comments)    Skin is very sensitive; PLEASE USE PAPER TAPE!!         Social History  . Marital status: Married    Spouse name: N/A  . Number of children: N/A  . Years of education: N/A   Occupational History  . Not on file.   Social History Main Topics  . Smoking status: Former Smoker    Years: 4.00    Types: Cigars  . Smokeless tobacco: Never Used  . Alcohol use No  . Drug use: No  . Sexual activity: Not on file   Other Topics Concern  . Not on file     Family History  Problem Relation Age of Onset  . Heart failure Father      ROS:  Please see the history of present illness.     All other systems reviewed and negative.    Physical Exam: Blood pressure (!) 112/52, pulse (!) 38, height 5\' 11"  (1.803 m), weight 183 lb (83 kg). Well developed and nourished in no acute distress HENT normal Neck supple with JVP-flat Clear Device pocket well healed; without hematoma or erythema.  There is no tethering  Regular rate and rhythm, no murmurs or gallops Abd-soft with active BS No Clubbing cyanosis edema Skin-warm and dry A & Oriented  Grossly normal sensory and motor function      Labs: Cardiac Enzymes No results for input(s): CKTOTAL, CKMB, TROPONINI in the last 72 hours. CBC Lab Results  Component Value  Date   WBC 12.2 (H) 06/17/2016   HGB 12.0 (L) 06/17/2016   HCT 35.4 (L) 06/17/2016   MCV 95.5 06/17/2016   PLT 230 06/17/2016   PROTIME: No results for input(s): LABPROT, INR in the last 72 hours. Chemistry No results for input(s): NA, K, CL, CO2, BUN, CREATININE, CALCIUM, PROT, BILITOT, ALKPHOS, ALT, AST, GLUCOSE in the last 168 hours.  Invalid input(s): LABALBU Lipids Lab Results  Component Value Date   CHOL 141 01/27/2016   HDL 32 (L) 01/27/2016   LDLCALC 103 (H) 01/27/2016   TRIG 32 01/27/2016   BNP No results found for: PROBNP Thyroid Function Tests: No results for input(s): TSH, T4TOTAL, T3FREE, THYROIDAB in the last 72 hours.  Invalid input(s): FREET3 Miscellaneous No results found for: DDIMER  Radiology/Studies:  No results found.  EKG:   P-synchronous/ AV  pacing   Assessment and Plan:  Sinus bradycardia    High-grade heart block intermittent  Ischemic cardiomyopathy  status post DES 2   12/17  EF 36% MRI 5/18   Congestive heart failure class II  Left bundle branch block  CRT-pacemaker St Jude   Hyperlipidemia  The patient is much improved following CRT implantation  Blood pressure remains on the low side limiting up titration of medications.  He is euvolemic.  We will continue his current diuretics             Current medicines are reviewed at length with the patient today .  The patient does not   have concerns regarding medicines.

## 2017-06-10 NOTE — Telephone Encounter (Signed)
Patient seen in clinic today- To Dr. Caryl Comes to review.

## 2017-06-10 NOTE — Telephone Encounter (Signed)
° °  [facility] Pre-operative Risk Assessment    Request for surgical clearance:  1. What type of surgery is being performed?  Extraction and crown   2. When is this surgery scheduled? tbd  3. What type of clearance is required (medical clearance vs. Pharmacy clearance to hold med vs. Both)?    Medical   4. Are there any medications that need to be held prior to surgery and how long? Not aware   5. Practice name and name of physician performing surgery? Dr. Wayne Both DDS  6. What is your office phone number [phone]    7.   What is your office fax number [phone]  8.   Anesthesia type (None, local, MAC, general) ?  Local    Clarisse Gouge 06/10/2017, 11:48 AM  _________________________________________________________________   (provider comments below)

## 2017-06-10 NOTE — Patient Instructions (Addendum)
Medication Instructions: - Your physician recommends that you continue on your current medications as directed. Please refer to the Current Medication list given to you today.  Labwork: - none ordered  Procedures/Testing: - none ordered  Follow-Up: - Your physician wants you to follow-up in: 6 months with Ignacia Bayley, NP for Dr. Caryl Comes & 9 months with Dr. Caryl Comes. You will receive a reminder letter in the mail two months in advance. If you don't receive a letter, please call our office to schedule the follow-up appointment.   Any Additional Special Instructions Will Be Listed Below (If Applicable).     If you need a refill on your cardiac medications before your next appointment, please call your pharmacy.

## 2017-06-12 ENCOUNTER — Encounter: Payer: Self-pay | Admitting: Oncology

## 2017-06-12 ENCOUNTER — Inpatient Hospital Stay: Payer: PPO | Attending: Oncology

## 2017-06-12 ENCOUNTER — Inpatient Hospital Stay (HOSPITAL_BASED_OUTPATIENT_CLINIC_OR_DEPARTMENT_OTHER): Payer: PPO | Admitting: Oncology

## 2017-06-12 VITALS — BP 92/60 | HR 60 | Temp 97.8°F | Resp 18 | Ht 71.0 in | Wt 184.2 lb

## 2017-06-12 DIAGNOSIS — C9110 Chronic lymphocytic leukemia of B-cell type not having achieved remission: Secondary | ICD-10-CM

## 2017-06-12 LAB — CBC WITH DIFFERENTIAL/PLATELET
Basophils Absolute: 0.1 10*3/uL (ref 0–0.1)
Basophils Relative: 1 %
Eosinophils Absolute: 0.2 10*3/uL (ref 0–0.7)
Eosinophils Relative: 1 %
HEMATOCRIT: 35.2 % — AB (ref 40.0–52.0)
HEMOGLOBIN: 11.9 g/dL — AB (ref 13.0–18.0)
LYMPHS ABS: 8.9 10*3/uL — AB (ref 1.0–3.6)
LYMPHS PCT: 69 %
MCH: 33.1 pg (ref 26.0–34.0)
MCHC: 33.9 g/dL (ref 32.0–36.0)
MCV: 97.7 fL (ref 80.0–100.0)
Monocytes Absolute: 0.6 10*3/uL (ref 0.2–1.0)
Monocytes Relative: 5 %
NEUTROS ABS: 3 10*3/uL (ref 1.4–6.5)
Neutrophils Relative %: 24 %
Platelets: 222 10*3/uL (ref 150–440)
RBC: 3.6 MIL/uL — AB (ref 4.40–5.90)
RDW: 14.6 % — ABNORMAL HIGH (ref 11.5–14.5)
WBC: 12.7 10*3/uL — AB (ref 3.8–10.6)

## 2017-06-12 NOTE — Progress Notes (Signed)
No new changes noted today 

## 2017-06-12 NOTE — Telephone Encounter (Signed)
Copy of this phone encounter was faxed to Dr. Jamal Maes at [phone]. Confirmation received.

## 2017-06-12 NOTE — Telephone Encounter (Signed)
Discussed the patient's upcoming dental procedure with Dr. Caryl Comes.  Per Dr. Caryl Comes- the patient is at acceptable cardiovascular risk to proceed. He may hold Brilinta for 5 days prior to procedure if needed. Resume when felt safe to do so by dentist.

## 2017-06-17 ENCOUNTER — Other Ambulatory Visit: Payer: PPO

## 2017-06-17 ENCOUNTER — Ambulatory Visit: Payer: PPO | Admitting: Oncology

## 2017-06-28 ENCOUNTER — Other Ambulatory Visit: Payer: Self-pay | Admitting: Internal Medicine

## 2017-06-30 DIAGNOSIS — D2372 Other benign neoplasm of skin of left lower limb, including hip: Secondary | ICD-10-CM | POA: Diagnosis not present

## 2017-06-30 DIAGNOSIS — M79671 Pain in right foot: Secondary | ICD-10-CM | POA: Diagnosis not present

## 2017-06-30 DIAGNOSIS — M79672 Pain in left foot: Secondary | ICD-10-CM | POA: Diagnosis not present

## 2017-08-18 DIAGNOSIS — E782 Mixed hyperlipidemia: Secondary | ICD-10-CM | POA: Diagnosis not present

## 2017-08-18 DIAGNOSIS — E538 Deficiency of other specified B group vitamins: Secondary | ICD-10-CM | POA: Diagnosis not present

## 2017-08-18 DIAGNOSIS — D500 Iron deficiency anemia secondary to blood loss (chronic): Secondary | ICD-10-CM | POA: Diagnosis not present

## 2017-08-25 DIAGNOSIS — E782 Mixed hyperlipidemia: Secondary | ICD-10-CM | POA: Diagnosis not present

## 2017-08-25 DIAGNOSIS — R21 Rash and other nonspecific skin eruption: Secondary | ICD-10-CM | POA: Diagnosis not present

## 2017-08-25 DIAGNOSIS — Z950 Presence of cardiac pacemaker: Secondary | ICD-10-CM | POA: Insufficient documentation

## 2017-08-25 DIAGNOSIS — Z125 Encounter for screening for malignant neoplasm of prostate: Secondary | ICD-10-CM | POA: Diagnosis not present

## 2017-08-25 DIAGNOSIS — I420 Dilated cardiomyopathy: Secondary | ICD-10-CM | POA: Diagnosis not present

## 2017-08-25 DIAGNOSIS — Z0000 Encounter for general adult medical examination without abnormal findings: Secondary | ICD-10-CM | POA: Diagnosis not present

## 2017-08-25 DIAGNOSIS — E538 Deficiency of other specified B group vitamins: Secondary | ICD-10-CM | POA: Diagnosis not present

## 2017-08-26 ENCOUNTER — Other Ambulatory Visit: Payer: Self-pay

## 2017-08-26 MED ORDER — SPIRONOLACTONE 25 MG PO TABS: 12.5000 mg | ORAL_TABLET | Freq: Every day | ORAL | 1 refills | Status: DC

## 2017-08-26 NOTE — Telephone Encounter (Signed)
*  STAT* If patient is at the pharmacy, call can be transferred to refill team.   1. Which medications need to be refilled? (please list name of each medication and dose if known) Spironolactone  2. Which pharmacy/location (including street and city if local pharmacy) is medication to be sent to? [facility]  3. Do they need a 30 day or 90 day supply?[place]

## 2017-08-29 ENCOUNTER — Other Ambulatory Visit: Payer: Self-pay | Admitting: Internal Medicine

## 2017-09-08 ENCOUNTER — Other Ambulatory Visit: Payer: Self-pay | Admitting: Internal Medicine

## 2017-09-09 ENCOUNTER — Ambulatory Visit (INDEPENDENT_AMBULATORY_CARE_PROVIDER_SITE_OTHER): Payer: PPO | Admitting: *Deleted

## 2017-09-09 ENCOUNTER — Telehealth: Payer: Self-pay

## 2017-09-09 DIAGNOSIS — I255 Ischemic cardiomyopathy: Secondary | ICD-10-CM | POA: Diagnosis not present

## 2017-09-09 NOTE — Progress Notes (Signed)
Remote pacemaker transmission.   

## 2017-09-09 NOTE — Telephone Encounter (Signed)
Spoke with pt and reminded pt of remote transmission that is due today. Pt verbalized understanding.   

## 2017-09-10 ENCOUNTER — Encounter: Payer: Self-pay | Admitting: Cardiology

## 2017-09-20 LAB — CUP PACEART REMOTE DEVICE CHECK
Battery Remaining Longevity: 88 mo
Battery Remaining Percentage: 95.5 %
Brady Statistic AP VP Percent: 87 %
Date Time Interrogation Session: 20190716184518
Implantable Lead Implant Date: 20190110
Implantable Lead Location: 753860
Implantable Pulse Generator Implant Date: 20190110
Lead Channel Impedance Value: 430 Ohm
Lead Channel Pacing Threshold Amplitude: 0.5 V
Lead Channel Pacing Threshold Amplitude: 1.5 V
Lead Channel Pacing Threshold Pulse Width: 0.5 ms
Lead Channel Pacing Threshold Pulse Width: 0.8 ms
Lead Channel Setting Pacing Amplitude: 2 V
Lead Channel Setting Pacing Amplitude: 2.5 V
Lead Channel Setting Pacing Pulse Width: 0.5 ms
Lead Channel Setting Sensing Sensitivity: 2 mV
MDC IDC LEAD IMPLANT DT: 20190110
MDC IDC LEAD IMPLANT DT: 20190110
MDC IDC LEAD LOCATION: 753858
MDC IDC LEAD LOCATION: 753859
MDC IDC MSMT BATTERY VOLTAGE: 2.98 V
MDC IDC MSMT LEADCHNL LV IMPEDANCE VALUE: 780 Ohm
MDC IDC MSMT LEADCHNL RA PACING THRESHOLD AMPLITUDE: 0.5 V
MDC IDC MSMT LEADCHNL RA PACING THRESHOLD PULSEWIDTH: 0.5 ms
MDC IDC MSMT LEADCHNL RA SENSING INTR AMPL: 3.8 mV
MDC IDC MSMT LEADCHNL RV IMPEDANCE VALUE: 590 Ohm
MDC IDC MSMT LEADCHNL RV SENSING INTR AMPL: 12 mV
MDC IDC SET LEADCHNL LV PACING AMPLITUDE: 2.5 V
MDC IDC SET LEADCHNL LV PACING PULSEWIDTH: 0.5 ms
MDC IDC STAT BRADY AP VS PERCENT: 1 %
MDC IDC STAT BRADY AS VP PERCENT: 11 %
MDC IDC STAT BRADY AS VS PERCENT: 1 %
MDC IDC STAT BRADY RA PERCENT PACED: 87 %
Pulse Gen Model: 3262
Pulse Gen Serial Number: 8942222

## 2017-10-06 ENCOUNTER — Other Ambulatory Visit: Payer: Self-pay

## 2017-10-06 NOTE — Telephone Encounter (Signed)
*  STAT* If patient is at the pharmacy, call can be transferred to refill team.   1. Which medications need to be refilled? (please list name of each medication and dose if known) Lipitor  2. Which pharmacy/location  [facility]  3. Do they need a 30 day or 90 day supply? [place]

## 2017-10-07 MED ORDER — ATORVASTATIN CALCIUM 80 MG PO TABS: 80.0000 mg | ORAL_TABLET | Freq: Every evening | ORAL | 0 refills | Status: DC

## 2017-10-24 DIAGNOSIS — D2271 Melanocytic nevi of right lower limb, including hip: Secondary | ICD-10-CM | POA: Diagnosis not present

## 2017-10-24 DIAGNOSIS — L258 Unspecified contact dermatitis due to other agents: Secondary | ICD-10-CM | POA: Diagnosis not present

## 2017-10-24 DIAGNOSIS — X32XXXA Exposure to sunlight, initial encounter: Secondary | ICD-10-CM | POA: Diagnosis not present

## 2017-10-24 DIAGNOSIS — D2261 Melanocytic nevi of right upper limb, including shoulder: Secondary | ICD-10-CM | POA: Diagnosis not present

## 2017-10-24 DIAGNOSIS — D485 Neoplasm of uncertain behavior of skin: Secondary | ICD-10-CM | POA: Diagnosis not present

## 2017-10-24 DIAGNOSIS — L821 Other seborrheic keratosis: Secondary | ICD-10-CM | POA: Diagnosis not present

## 2017-10-24 DIAGNOSIS — D2272 Melanocytic nevi of left lower limb, including hip: Secondary | ICD-10-CM | POA: Diagnosis not present

## 2017-10-24 DIAGNOSIS — L309 Dermatitis, unspecified: Secondary | ICD-10-CM | POA: Diagnosis not present

## 2017-10-24 DIAGNOSIS — Z08 Encounter for follow-up examination after completed treatment for malignant neoplasm: Secondary | ICD-10-CM | POA: Diagnosis not present

## 2017-10-24 DIAGNOSIS — D225 Melanocytic nevi of trunk: Secondary | ICD-10-CM | POA: Diagnosis not present

## 2017-10-24 DIAGNOSIS — D2262 Melanocytic nevi of left upper limb, including shoulder: Secondary | ICD-10-CM | POA: Diagnosis not present

## 2017-10-24 DIAGNOSIS — L570 Actinic keratosis: Secondary | ICD-10-CM | POA: Diagnosis not present

## 2017-10-24 DIAGNOSIS — Z85828 Personal history of other malignant neoplasm of skin: Secondary | ICD-10-CM | POA: Diagnosis not present

## 2017-12-01 DIAGNOSIS — Z23 Encounter for immunization: Secondary | ICD-10-CM | POA: Diagnosis not present

## 2017-12-08 ENCOUNTER — Encounter: Payer: Self-pay | Admitting: Nurse Practitioner

## 2017-12-08 ENCOUNTER — Ambulatory Visit (INDEPENDENT_AMBULATORY_CARE_PROVIDER_SITE_OTHER): Payer: PPO | Admitting: Nurse Practitioner

## 2017-12-08 VITALS — BP 124/64 | HR 60 | Ht 71.0 in | Wt 184.2 lb

## 2017-12-08 DIAGNOSIS — I2510 Atherosclerotic heart disease of native coronary artery without angina pectoris: Secondary | ICD-10-CM

## 2017-12-08 DIAGNOSIS — I495 Sick sinus syndrome: Secondary | ICD-10-CM | POA: Diagnosis not present

## 2017-12-08 DIAGNOSIS — I5022 Chronic systolic (congestive) heart failure: Secondary | ICD-10-CM

## 2017-12-08 DIAGNOSIS — E785 Hyperlipidemia, unspecified: Secondary | ICD-10-CM | POA: Diagnosis not present

## 2017-12-08 DIAGNOSIS — I255 Ischemic cardiomyopathy: Secondary | ICD-10-CM

## 2017-12-08 NOTE — Progress Notes (Signed)
Office Visit    Patient Name: Christopher Daniels Date of Encounter: 12/08/2017  Primary Care Provider:  Rusty Aus, MD Primary Cardiologist:  Virl Axe, MD  Chief Complaint    83 year old male with a history of CAD status post inferior STEMI with RCA and subsequent LAD stenting in December 2017, ischemic cardiomyopathy with an EF 36%, HFrEF, tachybradycardia syndrome status post biventricular pacemaker, CLL, and iron deficiency anemia, who presents for follow-up related to CAD and heart failure.  Past Medical History    Past Medical History:  Diagnosis Date  . Arthritis    "joints; fingers, knees" (03/06/2017)  . CAD (coronary artery disease)    a. 01/2016 Inf STEMI->PCI/Thrombectomy/DES to mid RCA (4.0 x 30 Resolute DES), EF 25-30%; b. 01/2016 Staged PCI/DES to mLAD (3.0x22 Resolute DES); c. 10/2016 Cath: LM nl, LAD patent stent, 30d, D2 70, LCX nl, OM1/2 nl, RCA patent mid/dist stent.  . CLL (chronic lymphocytic leukemia) ([place])    "never taken any RX; just monitor this twice/year" (03/06/2017)  . Diverticulitis   . GERD (gastroesophageal reflux disease)   . H/O Bell's palsy 1960s  . HFrEF (heart failure with reduced ejection fraction) ([place])   . High cholesterol   . History of kidney stones   . HOH (hard of hearing)   . Iron deficiency anemia   . Ischemic cardiomyopathy    a. 01/2016 EF 25-30%; b. 04/2016 f/u echo: EF 36%, mild glob HK, basal and mid inflat, inf, infsept, mid apical inf AK, mildly dil LA/RA, mild AI/MR, trace TR; c. 06/2016 Cardiac MRI: EF 36%, apical AK, mod diff HK, septal-lateral dyssynchrony  . LBBB (left bundle branch block)   . Mobitz type 2 second degree AV block 03/06/2017  . Myocardial infarction ([place]) 01/27/2016  . Presence of permanent cardiac pacemaker   . Skin cancer    a. on head years ago  . Tachy-brady syndrome ([place])    a. 02/2017 s/p SJM 3262 Quadra Allur MP CRT-P.   Past Surgical History:  Procedure Laterality Date  . BACK SURGERY      . BI-VENTRICULAR PACEMAKER INSERTION (CRT-P)  03/06/2017  . BIV PACEMAKER INSERTION CRT-P N/A 03/06/2017   Procedure: BIV PACEMAKER INSERTION CRT-P;  Surgeon: Evans Lance, MD;  Location: [place] [facility];  Service: Cardiovascular;  Laterality: N/A;  . CARDIAC CATHETERIZATION N/A 07/29/2014   Procedure: Left Heart Cath;  Surgeon: Teodoro Spray, MD;  Location: [place] [facility];  Service: Cardiovascular;  Laterality: N/A;  . CARDIAC CATHETERIZATION N/A 01/27/2016   Procedure: Left Heart Cath and Coronary Angiography;  Surgeon: [name], MD;  Location: [place] [facility];  Service: Cardiovascular;  Laterality: N/A;  . CARDIAC CATHETERIZATION N/A 01/27/2016   Procedure: Coronary Stent Intervention;  Surgeon: [name], MD;  Location: [place] [facility];  Service: Cardiovascular;  Laterality: N/A;  . CARDIAC CATHETERIZATION N/A 02/13/2016   Procedure: Coronary Stent Intervention;  Surgeon: [name], MD;  Location: [place] [facility];  Service: Cardiovascular;  Laterality: N/A;  . CARDIAC CATHETERIZATION N/A 02/13/2016   Procedure: Left Heart Cath and Coronary Angiography;  Surgeon: [name], MD;  Location: [place] [facility];  Service: Cardiovascular;  Laterality: N/A;  . CARDIAC CATHETERIZATION    . CATARACT EXTRACTION W/PHACO Right 07/18/2014   Procedure: CATARACT EXTRACTION PHACO AND INTRAOCULAR LENS PLACEMENT (IOC);  Surgeon: Estill Cotta, MD;  Location: [facility];  Service: Ophthalmology;  Laterality: Right;  [place] 03:11 AP% 28.4 CDE 75.64  .  CATARACT EXTRACTION W/PHACO Left 06/19/2015   Procedure: CATARACT EXTRACTION PHACO AND INTRAOCULAR LENS PLACEMENT (IOC);  Surgeon: Estill Cotta, MD;  Location: [facility];  Service: Ophthalmology;  Laterality: Left;  [place] 02:36.5 AP% 27.7 CDE 69.51 fluid pack lot # [id number]  . INGUINAL HERNIA REPAIR Left 2010  . LEFT HEART CATH AND CORONARY ANGIOGRAPHY N/A 11/18/2016   Procedure: LEFT HEART CATH AND CORONARY ANGIOGRAPHY;  Surgeon: Wellington Hampshire, MD;  Location: [place] [facility];  Service: Cardiovascular;  Laterality: N/A;  . LUMBAR [place]   "ruptured disc; took a piece off my buttocks and fixed it"  . SKIN CANCER EXCISION     "top of my head"  . TONSILLECTOMY      Allergies  Allergies  Allergen Reactions  . Niaspan [Niacin Er] Anaphylaxis and Other (See Comments)    Episode happened 10 to 15 years ago.  Has not tried any other similar meds since  . Adhesive [Tape] Other (See Comments)    Skin is very sensitive; PLEASE USE PAPER TAPE!!    History of Present Illness    83 year old male with the above complex past medical history including CAD status post inferior MI with stenting of the right coronary artery in December 2017 and subsequent staged drug-eluting stent placement to the LAD.  Following MI, his EF was 30 to 35% by echocardiogram in December 2017.  Follow-up echo in March 2018 revealed an EF of 36%.  Cardiac MRI corroborated this-EF 36% with apical akinesis, moderate diffuse hypokinesis, and septal-lateral dyssynchrony.  He is followed by Dr. Caryl Comes and was managed conservatively.  His last catheterization was in September 2018 revealing patent LAD and RCA stents.  His other history includes hyperlipidemia, ischemic cardiomyopathy, HFrEF, and tachybradycardia syndrome with placement of a [employer] CRT-P in January 2019 in the setting of intermittent atrial tachycardia with a baseline sinus bradycardia.    He was last seen in clinic approximately 6 months ago, and since then, he reports doing well.  He has remained active and was working outside and around his house regularly until about 2 weeks ago, when he hurt his back working on his tractor.  Also about 2 weeks ago, he started having lower abdominal discomfort and was diagnosed with diverticulitis.  This was managed with Cipro as an outpatient.  Though he had improvement in abdominal discomfort, he says he was having some palpitations while  on Cipro though when he checked his heart rate it was stable.  He denies chest pain, dyspnea, PND, orthopnea, dizziness, syncope, edema, or early satiety.  Home Medications    Prior to Admission medications   Medication Sig Start Date End Date Taking? Authorizing Provider  aspirin 81 MG tablet Take 81 mg by mouth daily.     [provider]  atorvastatin (LIPITOR) 80 MG tablet Take 1 tablet (80 mg total) by mouth every evening. 10/07/17   Theora Gianotti, NP  BRILINTA 90 MG TABS tablet TAKE ONE TABLET BY MOUTH TWICE DAILY 09/09/17   Deboraha Sprang, MD  carboxymethylcellulose (REFRESH TEARS) 0.5 % SOLN Place 1 drop into both eyes 3 (three) times daily as needed (for dry eyes).     [provider]  carvedilol (COREG) 3.125 MG tablet Take 1 tablet (3.125 mg total) by mouth 2 (two) times daily. 03/07/17 03/07/18  Baldwin Jamaica, PA-C  co-enzyme Q-10 30 MG capsule Take 30 mg by mouth daily.    [provider]  Cyanocobalamin (B-12)  [place] 500 mcg under the tongue every other day.     [provider]  Iron-Vitamin C (VITRON-C) 65-125 MG TABS Take 1 tablet by mouth 2 (two) times daily.    [provider]  lisinopril (PRINIVIL,ZESTRIL) 5 MG tablet TAKE ONE TABLET BY MOUTH EVERY DAY 06/30/17   Deboraha Sprang, MD  nitroGLYCERIN (NITROSTAT) 0.4 MG SL tablet Place 1 tablet (0.4 mg total) under the tongue every 5 (five) minutes x 3 doses as needed for chest pain. Patient not taking: Reported on 06/12/2017 01/29/16   [name], MD  pantoprazole (PROTONIX) 40 MG tablet Take 40 mg by mouth 2 (two) times daily.     [provider]  spironolactone (ALDACTONE) 25 MG tablet Take 0.5 tablets (12.5 mg total) by mouth daily. 08/26/17   Deboraha Sprang, MD    Review of Systems    Recent bout of diverticulitis/abdominal discomfort which has since improved.  He denies chest pain, palpitations, dyspnea, pnd, orthopnea, n, v, dizziness, syncope,  edema, weight gain, or early satiety.  All other systems reviewed and are otherwise negative except as noted above.  Physical Exam    VS:  BP 124/64 (BP Location: Left Arm, Patient Position: Sitting, Cuff Size: Normal)   Pulse 60   Ht 5\' 11"  (1.803 m)   Wt 184 lb 4 oz (83.6 kg)   BMI 25.70 kg/m  , BMI Body mass index is 25.7 kg/m. GEN: Well nourished, well developed, in no acute distress. HEENT: normal. Neck: Supple, no JVD, carotid bruits, or masses. Cardiac: RRR, no murmurs, rubs, or gallops. No clubbing, cyanosis, edema.  Radials/DP/PT 2+ and equal bilaterally.  Respiratory:  Respirations regular and unlabored, clear to auscultation bilaterally. GI: Soft, nontender, nondistended, BS + x 4. MS: no deformity or atrophy. Skin: warm and dry, no rash. Neuro:  Strength and sensation are intact. Psych: Normal affect.  Accessory Clinical Findings    ECG personally reviewed by me today - AV paced, 60 - no acute changes.  Assessment & Plan    1.  Coronary artery disease: Status post prior inferior STEMI in late 2017 with drug-eluting stent placement to the RCA and LAD.  Subsequent catheterization in September 2018 showed patency of prior placed stents with no targets for intervention.  He has been doing well since then without symptoms or limitations.  He remains on aspirin, Brilinta, statin, and beta-blocker therapy.  As he is coming up on 2 years since his MI, would recommend switching Brilinta to 60 mg twice daily when he is due for refill.  2.  HFrEF/ischemic cardiomyopathy: EF 36% by cardiac MRI in May 2018.  Euvolemic on exam and overall doing well.  He remains on beta-blocker, ACE inhibitor, and Spironolactone therapy.  He had labs in late June which showed stable renal function and potassium of 4.5.  3.  Tachybradycardia syndrome: Status post [place] medical CRT-P in January 2019.  He recently had palpitations in the setting of Cipro therapy for diverticulitis though says that  when he checked his heart rate, they were normal.  He is due for remote device check this week and has follow-up with elective physiology in January.  4.  Iron deficiency anemia: H&H stable by labs in June-13.1/39.8.  Followed by heme-onc.  5.  Hyperlipidemia: Remains on statin therapy with an LDL of 62 in June of this year.  6.  Disposition: He has follow-up remote device check later this week and then follow-up with Dr. Caryl Comes  in January.  Murray Hodgkins, NP 12/08/2017, 1:33 PM

## 2017-12-08 NOTE — Patient Instructions (Signed)
Medication Instructions:  - Your physician recommends that you continue on your current medications as directed. Please refer to the Current Medication list given to you today.  If you need a refill on your cardiac medications before your next appointment, please call your pharmacy.   Lab work: - none ordered  If you have labs (blood work) drawn today and your tests are completely normal, you will receive your results only by: Marland Kitchen MyChart Message (if you have MyChart) OR . A paper copy in the mail If you have any lab test that is abnormal or we need to change your treatment, we will call you to review the results.  Testing/Procedures: - none ordered  Follow-Up: At [facility], you and your health needs are our priority.  As part of our continuing mission to provide you with exceptional heart care, we have created designated Provider Care Teams.  These Care Teams include your primary Cardiologist (physician) and Advanced Practice Providers (APPs -  Physician Assistants and Nurse Practitioners) who all work together to provide you with the care you need, when you need it. . with Dr. Caryl Comes as scheduled  Any Other Special Instructions Will Be Listed Below (If Applicable). - N/A

## 2017-12-09 ENCOUNTER — Ambulatory Visit (INDEPENDENT_AMBULATORY_CARE_PROVIDER_SITE_OTHER): Payer: PPO | Admitting: *Deleted

## 2017-12-09 DIAGNOSIS — I495 Sick sinus syndrome: Secondary | ICD-10-CM

## 2017-12-10 NOTE — Progress Notes (Signed)
Remote pacemaker transmission.   

## 2017-12-11 ENCOUNTER — Inpatient Hospital Stay: Payer: PPO | Attending: Oncology

## 2017-12-11 DIAGNOSIS — C9110 Chronic lymphocytic leukemia of B-cell type not having achieved remission: Secondary | ICD-10-CM | POA: Diagnosis not present

## 2017-12-11 LAB — CBC WITH DIFFERENTIAL/PLATELET
ABS IMMATURE GRANULOCYTES: 0.03 10*3/uL (ref 0.00–0.07)
BASOS PCT: 1 %
Basophils Absolute: 0.1 10*3/uL (ref 0.0–0.1)
Eosinophils Absolute: 0.1 10*3/uL (ref 0.0–0.5)
Eosinophils Relative: 1 %
HCT: 34.1 % — ABNORMAL LOW (ref 39.0–52.0)
Hemoglobin: 10.9 g/dL — ABNORMAL LOW (ref 13.0–17.0)
IMMATURE GRANULOCYTES: 0 %
LYMPHS PCT: 67 %
Lymphs Abs: 8.7 10*3/uL — ABNORMAL HIGH (ref 0.7–4.0)
MCH: 30.8 pg (ref 26.0–34.0)
MCHC: 32 g/dL (ref 30.0–36.0)
MCV: 96.3 fL (ref 80.0–100.0)
MONO ABS: 0.7 10*3/uL (ref 0.1–1.0)
Monocytes Relative: 5 %
NEUTROS ABS: 3.3 10*3/uL (ref 1.7–7.7)
NEUTROS PCT: 26 %
PLATELETS: 252 10*3/uL (ref 150–400)
RBC: 3.54 MIL/uL — AB (ref 4.22–5.81)
RDW: 13.1 % (ref 11.5–15.5)
WBC: 12.9 10*3/uL — AB (ref 4.0–10.5)
nRBC: 0 % (ref 0.0–0.2)

## 2017-12-11 LAB — BASIC METABOLIC PANEL
Anion gap: 6 (ref 5–15)
BUN: 13 mg/dL (ref 8–23)
CHLORIDE: 103 mmol/L (ref 98–111)
CO2: 26 mmol/L (ref 22–32)
Calcium: 9 mg/dL (ref 8.9–10.3)
Creatinine, Ser: 1.04 mg/dL (ref 0.61–1.24)
GFR calc Af Amer: >60 mL/min (ref >60)
Glucose, Bld: 84 mg/dL (ref 70–99)
POTASSIUM: 4.3 mmol/L (ref 3.5–5.1)
SODIUM: 135 mmol/L (ref 135–145)

## 2017-12-16 ENCOUNTER — Encounter: Payer: Self-pay | Admitting: Oncology

## 2017-12-23 DIAGNOSIS — C44719 Basal cell carcinoma of skin of left lower limb, including hip: Secondary | ICD-10-CM | POA: Diagnosis not present

## 2018-01-02 ENCOUNTER — Other Ambulatory Visit: Payer: Self-pay

## 2018-01-02 MED ORDER — ATORVASTATIN CALCIUM 80 MG PO TABS: 80.0000 mg | ORAL_TABLET | Freq: Every evening | ORAL | 3 refills | Status: AC

## 2018-01-08 DIAGNOSIS — M62830 Muscle spasm of back: Secondary | ICD-10-CM | POA: Diagnosis not present

## 2018-01-19 DIAGNOSIS — G5701 Lesion of sciatic nerve, right lower limb: Secondary | ICD-10-CM | POA: Diagnosis not present

## 2018-02-06 ENCOUNTER — Other Ambulatory Visit: Payer: Self-pay | Admitting: *Deleted

## 2018-02-06 MED ORDER — SPIRONOLACTONE 25 MG PO TABS: 12.5000 mg | ORAL_TABLET | Freq: Every day | ORAL | 1 refills | Status: DC

## 2018-02-14 LAB — CUP PACEART REMOTE DEVICE CHECK
Brady Statistic AP VP Percent: 87 %
Brady Statistic AP VS Percent: 1 %
Brady Statistic AS VP Percent: 11 %
Brady Statistic RA Percent Paced: 87 %
Date Time Interrogation Session: 20191015072012
Implantable Lead Implant Date: 20190110
Implantable Lead Location: 753858
Implantable Pulse Generator Implant Date: 20190110
Lead Channel Impedance Value: 630 Ohm
Lead Channel Impedance Value: 750 Ohm
Lead Channel Pacing Threshold Amplitude: 0.5 V
Lead Channel Pacing Threshold Amplitude: 1.5 V
Lead Channel Pacing Threshold Pulse Width: 0.5 ms
Lead Channel Pacing Threshold Pulse Width: 0.8 ms
Lead Channel Sensing Intrinsic Amplitude: 12 mV
Lead Channel Setting Pacing Amplitude: 2 V
Lead Channel Setting Pacing Amplitude: 2.5 V
Lead Channel Setting Pacing Pulse Width: 0.5 ms
Lead Channel Setting Sensing Sensitivity: 2 mV
MDC IDC LEAD IMPLANT DT: 20190110
MDC IDC LEAD IMPLANT DT: 20190110
MDC IDC LEAD LOCATION: 753859
MDC IDC LEAD LOCATION: 753860
MDC IDC MSMT BATTERY REMAINING LONGEVITY: 88 mo
MDC IDC MSMT BATTERY REMAINING PERCENTAGE: 95.5 %
MDC IDC MSMT BATTERY VOLTAGE: 2.98 V
MDC IDC MSMT LEADCHNL RA IMPEDANCE VALUE: 400 Ohm
MDC IDC MSMT LEADCHNL RA PACING THRESHOLD AMPLITUDE: 0.5 V
MDC IDC MSMT LEADCHNL RA PACING THRESHOLD PULSEWIDTH: 0.5 ms
MDC IDC MSMT LEADCHNL RA SENSING INTR AMPL: 3.1 mV
MDC IDC PG SERIAL: 8942222
MDC IDC SET LEADCHNL LV PACING AMPLITUDE: 2.5 V
MDC IDC SET LEADCHNL LV PACING PULSEWIDTH: 0.5 ms
MDC IDC STAT BRADY AS VS PERCENT: 1 %

## 2018-02-20 DIAGNOSIS — E782 Mixed hyperlipidemia: Secondary | ICD-10-CM | POA: Diagnosis not present

## 2018-02-20 DIAGNOSIS — I420 Dilated cardiomyopathy: Secondary | ICD-10-CM | POA: Diagnosis not present

## 2018-02-20 DIAGNOSIS — E538 Deficiency of other specified B group vitamins: Secondary | ICD-10-CM | POA: Diagnosis not present

## 2018-02-20 DIAGNOSIS — Z125 Encounter for screening for malignant neoplasm of prostate: Secondary | ICD-10-CM | POA: Diagnosis not present

## 2018-02-27 DIAGNOSIS — E538 Deficiency of other specified B group vitamins: Secondary | ICD-10-CM | POA: Diagnosis not present

## 2018-02-27 DIAGNOSIS — Z0000 Encounter for general adult medical examination without abnormal findings: Secondary | ICD-10-CM | POA: Diagnosis not present

## 2018-02-27 DIAGNOSIS — I420 Dilated cardiomyopathy: Secondary | ICD-10-CM | POA: Diagnosis not present

## 2018-02-27 DIAGNOSIS — D500 Iron deficiency anemia secondary to blood loss (chronic): Secondary | ICD-10-CM | POA: Diagnosis not present

## 2018-02-27 DIAGNOSIS — C9110 Chronic lymphocytic leukemia of B-cell type not having achieved remission: Secondary | ICD-10-CM | POA: Diagnosis not present

## 2018-03-02 ENCOUNTER — Other Ambulatory Visit: Payer: Self-pay | Admitting: Physician Assistant

## 2018-03-09 DIAGNOSIS — Z86010 Personal history of colonic polyps: Secondary | ICD-10-CM | POA: Diagnosis not present

## 2018-03-09 DIAGNOSIS — R935 Abnormal findings on diagnostic imaging of other abdominal regions, including retroperitoneum: Secondary | ICD-10-CM | POA: Diagnosis not present

## 2018-03-09 DIAGNOSIS — C9110 Chronic lymphocytic leukemia of B-cell type not having achieved remission: Secondary | ICD-10-CM | POA: Diagnosis not present

## 2018-03-09 DIAGNOSIS — D509 Iron deficiency anemia, unspecified: Secondary | ICD-10-CM | POA: Diagnosis not present

## 2018-03-09 DIAGNOSIS — E538 Deficiency of other specified B group vitamins: Secondary | ICD-10-CM | POA: Diagnosis not present

## 2018-03-09 DIAGNOSIS — I420 Dilated cardiomyopathy: Secondary | ICD-10-CM | POA: Diagnosis not present

## 2018-03-09 DIAGNOSIS — K219 Gastro-esophageal reflux disease without esophagitis: Secondary | ICD-10-CM | POA: Diagnosis not present

## 2018-03-10 ENCOUNTER — Encounter: Payer: Self-pay | Admitting: Internal Medicine

## 2018-03-10 ENCOUNTER — Telehealth: Payer: Self-pay | Admitting: *Deleted

## 2018-03-10 ENCOUNTER — Ambulatory Visit: Payer: PPO | Admitting: Internal Medicine

## 2018-03-10 ENCOUNTER — Ambulatory Visit (INDEPENDENT_AMBULATORY_CARE_PROVIDER_SITE_OTHER): Payer: PPO

## 2018-03-10 VITALS — BP 115/60 | HR 62 | Ht 71.0 in | Wt 185.0 lb

## 2018-03-10 DIAGNOSIS — I447 Left bundle-branch block, unspecified: Secondary | ICD-10-CM

## 2018-03-10 DIAGNOSIS — I255 Ischemic cardiomyopathy: Secondary | ICD-10-CM

## 2018-03-10 DIAGNOSIS — R001 Bradycardia, unspecified: Secondary | ICD-10-CM

## 2018-03-10 DIAGNOSIS — I5022 Chronic systolic (congestive) heart failure: Secondary | ICD-10-CM | POA: Diagnosis not present

## 2018-03-10 DIAGNOSIS — Z950 Presence of cardiac pacemaker: Secondary | ICD-10-CM | POA: Diagnosis not present

## 2018-03-10 DIAGNOSIS — I442 Atrioventricular block, complete: Secondary | ICD-10-CM

## 2018-03-10 LAB — CUP PACEART INCLINIC DEVICE CHECK
Battery Voltage: 2.98 V
Brady Statistic RA Percent Paced: 85 %
Brady Statistic RV Percent Paced: 98 %
Implantable Lead Implant Date: 20190110
Implantable Lead Implant Date: 20190110
Implantable Lead Location: 753858
Implantable Lead Location: 753859
Lead Channel Impedance Value: 625 Ohm
Lead Channel Pacing Threshold Amplitude: 0.5 V
Lead Channel Pacing Threshold Pulse Width: 0.5 ms
Lead Channel Setting Pacing Amplitude: 2 V
Lead Channel Setting Pacing Amplitude: 2.5 V
Lead Channel Setting Sensing Sensitivity: 2 mV
MDC IDC LEAD IMPLANT DT: 20190110
MDC IDC LEAD LOCATION: 753860
MDC IDC MSMT BATTERY REMAINING LONGEVITY: 68 mo
MDC IDC MSMT LEADCHNL LV IMPEDANCE VALUE: 812.5 Ohm
MDC IDC MSMT LEADCHNL LV PACING THRESHOLD AMPLITUDE: 1.5 V
MDC IDC MSMT LEADCHNL LV PACING THRESHOLD PULSEWIDTH: 1.5 ms
MDC IDC MSMT LEADCHNL RA IMPEDANCE VALUE: 412.5 Ohm
MDC IDC MSMT LEADCHNL RA PACING THRESHOLD AMPLITUDE: 0.5 V
MDC IDC MSMT LEADCHNL RA SENSING INTR AMPL: 4.2 mV
MDC IDC MSMT LEADCHNL RV PACING THRESHOLD PULSEWIDTH: 0.5 ms
MDC IDC MSMT LEADCHNL RV SENSING INTR AMPL: 12 mV
MDC IDC PG IMPLANT DT: 20190110
MDC IDC SESS DTM: 20200114164407
MDC IDC SET LEADCHNL LV PACING AMPLITUDE: 2.5 V
MDC IDC SET LEADCHNL LV PACING PULSEWIDTH: 1.5 ms
MDC IDC SET LEADCHNL RV PACING PULSEWIDTH: 0.5 ms
Pulse Gen Model: 3262
Pulse Gen Serial Number: 8942222

## 2018-03-10 MED ORDER — LISINOPRIL 5 MG PO TABS: ORAL_TABLET | ORAL | Status: DC

## 2018-03-10 NOTE — Telephone Encounter (Signed)
   Primary Cardiologist: Virl Axe, MD  Chart reviewed as part of pre-operative protocol coverage.   Dr. Caryl Comes you saw this patient today and recommended "In anticipation of his colonoscopy for his anemia, he can stop his Brilinta for 5 days". Can you comment on ASA?  Please forward your response to P [facility] PREOP.   Thank you  Leanor Kail, PA 03/10/2018, 4:31 PM

## 2018-03-10 NOTE — Telephone Encounter (Signed)
   [facility] Pre-operative Risk Assessment    Request for surgical clearance:  1. What type of surgery is being performed? COLONOSCOPY AND ENDOSCOPY   2. When is this surgery scheduled? 04/20/18   3. What type of clearance is required (medical clearance vs. Pharmacy clearance to hold med vs. Both)? BOTH  4. Are there any medications that need to be held prior to surgery and how long?BRILINTA (REQUEST HOLD 5 DAYS PRIOR), ASA    5. Practice name and name of physician performing surgery? [facility]; Denice Paradise, NP   6. What is your office phone number [phone]    7.   What is your office fax number [phone]  8.   Anesthesia type (None, local, MAC, general) ? MONITORED ANESTHESIA   Christopher Daniels 03/10/2018, 10:23 AM  _________________________________________________________________   (provider comments below)

## 2018-03-10 NOTE — Progress Notes (Signed)
Patient Care Team: Rusty Aus, MD as PCP - General (Internal Medicine) Deboraha Sprang, MD as PCP - Cardiology (Cardiology)   HPI  Christopher Daniels is a 84 y.o. male seen in followup of sinus bradycardia; left ventricular dysfunction in the setting of a recent MI. He also has LBBB;  he is status post CRT-P implantation (GT) when he presented with higher grade heart block.  He has a history of ischemic heart disease.  12/17 STEMI     DATE TEST EF   12/17 LHC 25-30% RCA  Stent 100% >> 0  LAD Stent 80% >>0  5/18 cMRI   36 % + LGE apical septum--normal LV thickness  5/18 Echo   30-35 % LVH 1.3 cm  9/18 LHC  Patent Stents       Date Cr K Hgb  4/18  0.79 4.5    6/18  1.0 4.5 13.7  1/19 0.9 3.8   10/19 1.04 4.3 10.9 (11.2-12/19)        He is undergoing evaluation for his anemia.  Ferritins have been quite low in the past.  Colonoscopy is anticipated.  For his back in early December.  Other than that no limitations of SOB or CP or edema  Does have periodic significant lightheadedness precluding getting around.  Typically occurs once a week.  Associated with low blood pressure.  Taking his ACE inhibitor and Aldactone at night  Past Medical History:  Diagnosis Date  . Arthritis    "joints; fingers, knees" (03/06/2017)  . CAD (coronary artery disease)    a. 01/2016 Inf STEMI->PCI/Thrombectomy/DES to mid RCA (4.0 x 30 Resolute DES), EF 25-30%; b. 01/2016 Staged PCI/DES to mLAD (3.0x22 Resolute DES); c. 10/2016 Cath: LM nl, LAD patent stent, 30d, D2 70, LCX nl, OM1/2 nl, RCA patent mid/dist stent.  . CLL (chronic lymphocytic leukemia) ([place])    "never taken any RX; just monitor this twice/year" (03/06/2017)  . Diverticulitis   . GERD (gastroesophageal reflux disease)   . H/O Bell's palsy 1960s  . HFrEF (heart failure with reduced ejection fraction) ([place])   . High cholesterol   . History of kidney stones   . HOH (hard of hearing)   . Iron deficiency anemia   . Ischemic  cardiomyopathy    a. 01/2016 EF 25-30%; b. 04/2016 f/u echo: EF 36%, mild glob HK, basal and mid inflat, inf, infsept, mid apical inf AK, mildly dil LA/RA, mild AI/MR, trace TR; c. 06/2016 Cardiac MRI: EF 36%, apical AK, mod diff HK, septal-lateral dyssynchrony  . LBBB (left bundle branch block)   . Mobitz type 2 second degree AV block 03/06/2017  . Myocardial infarction ([place]) 01/27/2016  . Presence of permanent cardiac pacemaker   . Skin cancer    a. on head years ago  . Tachy-brady syndrome ([place])    a. 02/2017 s/p SJM 3262 Quadra Allur MP CRT-P.    Past Surgical History:  Procedure Laterality Date  . BACK SURGERY    . BI-VENTRICULAR PACEMAKER INSERTION (CRT-P)  03/06/2017  . BIV PACEMAKER INSERTION CRT-P N/A 03/06/2017   Procedure: BIV PACEMAKER INSERTION CRT-P;  Surgeon: Evans Lance, MD;  Location: [place] [facility];  Service: Cardiovascular;  Laterality: N/A;  . CARDIAC CATHETERIZATION N/A 07/29/2014   Procedure: Left Heart Cath;  Surgeon: Teodoro Spray, MD;  Location: [place] [facility];  Service: Cardiovascular;  Laterality: N/A;  . CARDIAC CATHETERIZATION N/A 01/27/2016   Procedure: Left Heart Cath and  Coronary Angiography;  Surgeon: [name], MD;  Location: [place] [facility];  Service: Cardiovascular;  Laterality: N/A;  . CARDIAC CATHETERIZATION N/A 01/27/2016   Procedure: Coronary Stent Intervention;  Surgeon: [name], MD;  Location: [place] [facility];  Service: Cardiovascular;  Laterality: N/A;  . CARDIAC CATHETERIZATION N/A 02/13/2016   Procedure: Coronary Stent Intervention;  Surgeon: [name], MD;  Location: [place] [facility];  Service: Cardiovascular;  Laterality: N/A;  . CARDIAC CATHETERIZATION N/A 02/13/2016   Procedure: Left Heart Cath and Coronary Angiography;  Surgeon: [name], MD;  Location: [place] [facility];  Service: Cardiovascular;  Laterality: N/A;  . CARDIAC CATHETERIZATION    . CATARACT EXTRACTION W/PHACO Right 07/18/2014   Procedure: CATARACT  EXTRACTION PHACO AND INTRAOCULAR LENS PLACEMENT (IOC);  Surgeon: Estill Cotta, MD;  Location: [facility];  Service: Ophthalmology;  Laterality: Right;  [place] 03:11 AP% 28.4 CDE 75.64  . CATARACT EXTRACTION W/PHACO Left 06/19/2015   Procedure: CATARACT EXTRACTION PHACO AND INTRAOCULAR LENS PLACEMENT (IOC);  Surgeon: Estill Cotta, MD;  Location: [facility];  Service: Ophthalmology;  Laterality: Left;  [place] 02:36.5 AP% 27.7 CDE 69.51 fluid pack lot # [id number]  . INGUINAL HERNIA REPAIR Left 2010  . LEFT HEART CATH AND CORONARY ANGIOGRAPHY N/A 11/18/2016   Procedure: LEFT HEART CATH AND CORONARY ANGIOGRAPHY;  Surgeon: Wellington Hampshire, MD;  Location: [place] [facility];  Service: Cardiovascular;  Laterality: N/A;  . LUMBAR [place]   "ruptured disc; took a piece off my buttocks and fixed it"  . SKIN CANCER EXCISION     "top of my head"  . TONSILLECTOMY      Current Outpatient Medications  Medication Sig Dispense Refill  . aspirin 81 MG tablet Take 81 mg by mouth daily.     Marland Kitchen atorvastatin (LIPITOR) 80 MG tablet Take 1 tablet (80 mg total) by mouth every evening. 90 tablet 3  . BRILINTA 90 MG TABS tablet TAKE ONE TABLET BY MOUTH TWICE DAILY 180 tablet 3  . carboxymethylcellulose (REFRESH TEARS) 0.5 % SOLN Place 1 drop into both eyes 3 (three) times daily as needed (for dry eyes).     . carvedilol (COREG) 3.125 MG tablet TAKE ONE TABLET BY MOUTH TWICE DAILY 180 tablet 3  . co-enzyme Q-10 30 MG capsule Take 30 mg by mouth daily.    . Cyanocobalamin (B-12) 500 MCG SUBL Place 500 mcg under the tongue every other day.     . Iron-Vitamin C (VITRON-C) 65-125 MG TABS Take 1 tablet by mouth 2 (two) times daily. Taking M,W F    . lisinopril (PRINIVIL,ZESTRIL) 5 MG tablet TAKE ONE TABLET BY MOUTH EVERY DAY 90 tablet 3  . nitroGLYCERIN (NITROSTAT) 0.4 MG SL tablet Place 1 tablet (0.4 mg total) under the tongue every 5 (five) minutes x 3 doses as needed for chest pain. 25 tablet 3  .  pantoprazole (PROTONIX) 40 MG tablet Take 40 mg by mouth 2 (two) times daily.     Marland Kitchen spironolactone (ALDACTONE) 25 MG tablet Take 0.5 tablets (12.5 mg total) by mouth daily. 45 tablet 1   No current facility-administered medications for this visit.     Allergies  Allergen Reactions  . Niaspan [Niacin Er] Anaphylaxis and Other (See Comments)    Episode happened 10 to 15 years ago.  Has not tried any other similar meds since  . Adhesive [Tape] Other (See Comments)    Skin is very sensitive; PLEASE USE PAPER TAPE!!  Social History  . Marital status: Married    Spouse name: N/A  . Number of children: N/A  . Years of education: N/A   Occupational History  . Not on file.   Social History Main Topics  . Smoking status: Former Smoker    Years: 4.00    Types: Cigars  . Smokeless tobacco: Never Used  . Alcohol use No  . Drug use: No  . Sexual activity: Not on file   Other Topics Concern  . Not on file     Family History  Problem Relation Age of Onset  . Heart failure Father      ROS:  Please see the history of present illness.     All other systems reviewed and negative.    BP 115/60 (BP Location: Left Arm, Patient Position: Sitting, Cuff Size: Normal)   Pulse 62   Ht 5\' 11"  (1.803 m)   Wt 185 lb (83.9 kg)   BMI 25.80 kg/m  Well developed and nourished in no acute distress HENT normal Neck supple with JVP-flat Clear Regular rate and rhythm, no murmurs or gallops Abd-soft with active BS No Clubbing cyanosis edema Skin-warm and dry A & Oriented  Grossly normal sensory and motor function  Cardiac Enzymes No results for input(s): CKTOTAL, CKMB, TROPONINI in the last 72 hours. CBC Lab Results  Component Value Date   WBC 12.2 (H) 06/17/2016   HGB 12.0 (L) 06/17/2016   HCT 35.4 (L) 06/17/2016   MCV 95.5 06/17/2016   PLT 230 06/17/2016   PROTIME: No results for input(s): LABPROT, INR in the last 72 hours. Chemistry No results for input(s): NA, K, CL,  CO2, BUN, CREATININE, CALCIUM, PROT, BILITOT, ALKPHOS, ALT, AST, GLUCOSE in the last 168 hours.  Invalid input(s): LABALBU Lipids Lab Results  Component Value Date   CHOL 141 01/27/2016   HDL 32 (L) 01/27/2016   LDLCALC 103 (H) 01/27/2016   TRIG 32 01/27/2016   BNP No results found for: PROBNP Thyroid Function Tests: No results for input(s): TSH, T4TOTAL, T3FREE, THYROIDAB in the last 72 hours.  Invalid input(s): FREET3 Miscellaneous No results found for: DDIMER  Radiology/Studies:  No results found.  EKG:  P synchronous pacing with 1) upright QRS v1 and neg lead 1 2) LVLL   Assessment and Plan:  Sinus bradycardia    High-grade heart block intermittent  Ischemic cardiomyopathy  status post DES 2   12/17  EF 36% MRI 5/18   Congestive heart failure class II  Left bundle branch block  CRT-pacemaker [employer] Jude   Hyperlipidemia  Orthostatic hypotension  BP remains low with symptomatic orthostatic hypotension will decrease his p.m. lisinopril  LV threshold is a little bit elevated.  However, EM-RN was able to find a good output with good capture margin  Euvolemic continue current meds  Without symptoms of ischemia  In anticipation of his colonoscopy for his anemia, he can stop his Brilinta for 5 days.            Current medicines are reviewed at length with the patient today .  The patient does not   have concerns regarding medicines.

## 2018-03-10 NOTE — Patient Instructions (Addendum)
Medication Instructions:  - Your physician has recommended you make the following change in your medication:   1) DECREASE lisinopril 5 mg- take 1/2 tablet (2.5 mg) by mouth once daily  If you need a refill on your cardiac medications before your next appointment, please call your pharmacy.   Lab work: - none ordered  If you have labs (blood work) drawn today and your tests are completely normal, you will receive your results only by: Marland Kitchen MyChart Message (if you have MyChart) OR . A paper copy in the mail If you have any lab test that is abnormal or we need to change your treatment, we will call you to review the results.  Testing/Procedures: - none ordered  Follow-Up: At [facility], you and your health needs are our priority.  As part of our continuing mission to provide you with exceptional heart care, we have created designated Provider Care Teams.  These Care Teams include your primary Cardiologist (physician) and Advanced Practice Providers (APPs -  Physician Assistants and Nurse Practitioners) who all work together to provide you with the care you need, when you need it. You will need a follow up appointment in 1 year with Dr. Caryl Comes.  Please call our office 2 months in advance to schedule this appointment.  Remote monitoring is used to monitor your Pacemaker of ICD from home. This monitoring reduces the number of office visits required to check your device to one time per year. It allows [place] to keep an eye on the functioning of your device to ensure it is working properly. You are scheduled for a device check from home on 06/09/2018. You may send your transmission at any time that day. If you have a wireless device, the transmission will be sent automatically. After your physician reviews your transmission, you will receive a postcard with your next transmission date.   Any Other Special Instructions Will Be Listed Below (If Applicable). - N/A

## 2018-03-11 NOTE — Progress Notes (Signed)
Remote pacemaker transmission.   

## 2018-03-14 LAB — CUP PACEART REMOTE DEVICE CHECK
Battery Remaining Longevity: 87 mo
Battery Remaining Percentage: 95.5 %
Battery Voltage: 2.98 V
Brady Statistic AP VP Percent: 86 %
Brady Statistic RA Percent Paced: 85 %
Date Time Interrogation Session: 20200114100456
Implantable Lead Implant Date: 20190110
Implantable Lead Location: 753858
Implantable Lead Location: 753860
Lead Channel Impedance Value: 390 Ohm
Lead Channel Impedance Value: 600 Ohm
Lead Channel Impedance Value: 740 Ohm
Lead Channel Pacing Threshold Amplitude: 1.5 V
Lead Channel Pacing Threshold Pulse Width: 0.5 ms
Lead Channel Pacing Threshold Pulse Width: 0.8 ms
Lead Channel Setting Pacing Amplitude: 2.5 V
Lead Channel Setting Pacing Pulse Width: 0.5 ms
Lead Channel Setting Sensing Sensitivity: 2 mV
MDC IDC LEAD IMPLANT DT: 20190110
MDC IDC LEAD IMPLANT DT: 20190110
MDC IDC LEAD LOCATION: 753859
MDC IDC MSMT LEADCHNL RA PACING THRESHOLD AMPLITUDE: 0.5 V
MDC IDC MSMT LEADCHNL RA SENSING INTR AMPL: 3.1 mV
MDC IDC MSMT LEADCHNL RV PACING THRESHOLD AMPLITUDE: 0.5 V
MDC IDC MSMT LEADCHNL RV PACING THRESHOLD PULSEWIDTH: 0.5 ms
MDC IDC MSMT LEADCHNL RV SENSING INTR AMPL: 12 mV
MDC IDC PG IMPLANT DT: 20190110
MDC IDC PG SERIAL: 8942222
MDC IDC SET LEADCHNL LV PACING PULSEWIDTH: 0.5 ms
MDC IDC SET LEADCHNL RA PACING AMPLITUDE: 2 V
MDC IDC SET LEADCHNL RV PACING AMPLITUDE: 2.5 V
MDC IDC STAT BRADY AP VS PERCENT: 1 %
MDC IDC STAT BRADY AS VP PERCENT: 12 %
MDC IDC STAT BRADY AS VS PERCENT: 1 %

## 2018-03-24 DIAGNOSIS — M545 Low back pain: Secondary | ICD-10-CM | POA: Diagnosis not present

## 2018-03-25 ENCOUNTER — Other Ambulatory Visit: Payer: Self-pay | Admitting: Physician Assistant

## 2018-03-25 DIAGNOSIS — M545 Low back pain, unspecified: Secondary | ICD-10-CM

## 2018-03-27 ENCOUNTER — Encounter
Admission: RE | Admit: 2018-03-27 | Discharge: 2018-03-27 | Disposition: A | Discharge status: Home / Self Care | Payer: PPO | Source: Ambulatory Visit | Attending: Physician Assistant | Admitting: Physician Assistant

## 2018-03-27 DIAGNOSIS — M545 Low back pain, unspecified: Secondary | ICD-10-CM

## 2018-03-27 MED ORDER — TECHNETIUM TC 99M MEDRONATE IV KIT
23.1900 | PACK | Freq: Once | INTRAVENOUS | Status: AC | PRN
  Administered 2018-03-27: 23.19 via INTRAVENOUS

## 2018-04-01 DIAGNOSIS — S32010A Wedge compression fracture of first lumbar vertebra, initial encounter for closed fracture: Secondary | ICD-10-CM | POA: Diagnosis not present

## 2018-04-02 NOTE — Telephone Encounter (Signed)
Creola Corn could you have Dr. Caryl Comes say if Mr. Winegar could hold asa and plavix for his colonoscopy?  Thanks.

## 2018-04-02 NOTE — Telephone Encounter (Signed)
Christopher Daniels   pci 12/17  Although there should be an indication for this colonoscopy in this 84 year old man besides routine screening

## 2018-04-03 NOTE — Telephone Encounter (Signed)
   Primary Cardiologist: Virl Axe, MD  Chart reviewed as part of pre-operative protocol coverage. Patient was contacted 04/03/2018 in reference to pre-operative risk assessment for pending surgery as outlined below.  DOMINIE BENEDICK was last seen on 03/10/18 by Dr. Caryl Comes.  Since that day, RAJIV PARLATO has done well from a cardiac standpoint. He has no new cardiac complaints; however, he has compression in her lumbar spine and is unable to walk daily as he has been. Mobility limited by back pain and he was told to rest and to not do activity.   Per Dr. Olin Pia note: "In anticipation of his colonoscopy for his anemia, he can stop his Brilinta for 5 days".  He may also hold antiplatelets if necessary, per requesting provider.   Therefore, based on ACC/AHA guidelines, the patient would be at acceptable risk for the planned procedure without further cardiovascular testing.   I will route this recommendation to the requesting party via Epic fax function and remove from pre-op pool.  Please call with questions.  Tami Lin Duke, PA 04/03/2018, 8:27 AM

## 2018-04-13 ENCOUNTER — Telehealth: Payer: Self-pay | Admitting: Internal Medicine

## 2018-04-13 NOTE — Telephone Encounter (Signed)
Spoke with patient's daughter, ok per DPR. Discussed patient's clearance and reiterated clearance encounter noted: "Per Dr. Olin Pia note: "In anticipation of his colonoscopy for his anemia, he can stop his Brilinta for 5 days".  He may also hold antiplatelets if necessary, per requesting provider.   Therefore, based on ACC/AHA guidelines, the patient would be at acceptable risk for the planned procedure without further cardiovascular testing."  She verbalized understanding. She thinks it will help the patient knowing his daughter called to make sure this was all ok. He needed reassurance.

## 2018-04-13 NOTE — Telephone Encounter (Signed)
Patient daughter calling States that patient was cleared to be off medication for surgery but states the patient is a bit agitated and worried about stopping medication Would like to discuss with nurse to help aid in explaining to patient that it is ok to stop  Please call to discuss - best number to contact is [phone]

## 2018-04-17 ENCOUNTER — Encounter: Payer: Self-pay | Admitting: *Deleted

## 2018-04-20 ENCOUNTER — Ambulatory Visit: Payer: PPO | Admitting: Anesthesiology

## 2018-04-20 ENCOUNTER — Encounter: Payer: Self-pay | Admitting: *Deleted

## 2018-04-20 ENCOUNTER — Ambulatory Visit
Admission: RE | Admit: 2018-04-20 | Discharge: 2018-04-20 | Disposition: A | Discharge status: Home / Self Care | Payer: PPO | Attending: Unknown Physician Specialty | Admitting: Unknown Physician Specialty

## 2018-04-20 ENCOUNTER — Encounter
Admission: RE | Disposition: A | Discharge status: Home / Self Care | Payer: Self-pay | Source: Home / Self Care | Attending: Unknown Physician Specialty

## 2018-04-20 DIAGNOSIS — K5730 Diverticulosis of large intestine without perforation or abscess without bleeding: Secondary | ICD-10-CM | POA: Insufficient documentation

## 2018-04-20 DIAGNOSIS — I252 Old myocardial infarction: Secondary | ICD-10-CM | POA: Diagnosis not present

## 2018-04-20 DIAGNOSIS — Z86010 Personal history of colonic polyps: Secondary | ICD-10-CM | POA: Diagnosis not present

## 2018-04-20 DIAGNOSIS — K317 Polyp of stomach and duodenum: Secondary | ICD-10-CM | POA: Insufficient documentation

## 2018-04-20 DIAGNOSIS — Z7902 Long term (current) use of antithrombotics/antiplatelets: Secondary | ICD-10-CM | POA: Diagnosis not present

## 2018-04-20 DIAGNOSIS — K219 Gastro-esophageal reflux disease without esophagitis: Secondary | ICD-10-CM | POA: Insufficient documentation

## 2018-04-20 DIAGNOSIS — Z950 Presence of cardiac pacemaker: Secondary | ICD-10-CM | POA: Diagnosis not present

## 2018-04-20 DIAGNOSIS — Z87891 Personal history of nicotine dependence: Secondary | ICD-10-CM | POA: Diagnosis not present

## 2018-04-20 DIAGNOSIS — Z79899 Other long term (current) drug therapy: Secondary | ICD-10-CM | POA: Diagnosis not present

## 2018-04-20 DIAGNOSIS — K640 First degree hemorrhoids: Secondary | ICD-10-CM | POA: Diagnosis not present

## 2018-04-20 DIAGNOSIS — D509 Iron deficiency anemia, unspecified: Secondary | ICD-10-CM | POA: Diagnosis not present

## 2018-04-20 DIAGNOSIS — K449 Diaphragmatic hernia without obstruction or gangrene: Secondary | ICD-10-CM | POA: Insufficient documentation

## 2018-04-20 DIAGNOSIS — E7800 Pure hypercholesterolemia, unspecified: Secondary | ICD-10-CM | POA: Diagnosis not present

## 2018-04-20 DIAGNOSIS — K648 Other hemorrhoids: Secondary | ICD-10-CM | POA: Diagnosis not present

## 2018-04-20 DIAGNOSIS — K5790 Diverticulosis of intestine, part unspecified, without perforation or abscess without bleeding: Secondary | ICD-10-CM | POA: Diagnosis not present

## 2018-04-20 DIAGNOSIS — Z7982 Long term (current) use of aspirin: Secondary | ICD-10-CM | POA: Insufficient documentation

## 2018-04-20 DIAGNOSIS — I2510 Atherosclerotic heart disease of native coronary artery without angina pectoris: Secondary | ICD-10-CM | POA: Insufficient documentation

## 2018-04-20 HISTORY — PX: ESOPHAGOGASTRODUODENOSCOPY (EGD) WITH PROPOFOL: SHX5813

## 2018-04-20 HISTORY — PX: COLONOSCOPY WITH PROPOFOL: SHX5780

## 2018-04-20 SURGERY — COLONOSCOPY WITH PROPOFOL
Anesthesia: General

## 2018-04-20 MED ORDER — PROPOFOL 500 MG/50ML IV EMUL
INTRAVENOUS | Status: AC
  Filled 2018-04-20: qty 50

## 2018-04-20 MED ORDER — FENTANYL CITRATE (PF) 100 MCG/2ML IJ SOLN
INTRAMUSCULAR | Status: AC
  Filled 2018-04-20: qty 2

## 2018-04-20 MED ORDER — SODIUM CHLORIDE 0.9 % IV SOLN: INTRAVENOUS | Status: DC

## 2018-04-20 MED ORDER — SODIUM CHLORIDE 0.9 % IV SOLN
INTRAVENOUS | Status: DC
  Administered 2018-04-20: 1000 mL via INTRAVENOUS

## 2018-04-20 MED ORDER — LIDOCAINE HCL (CARDIAC) PF 100 MG/5ML IV SOSY
PREFILLED_SYRINGE | INTRAVENOUS | Status: DC | PRN
  Administered 2018-04-20: 30 mg via INTRAVENOUS

## 2018-04-20 MED ORDER — PROPOFOL 500 MG/50ML IV EMUL
INTRAVENOUS | Status: DC | PRN
  Administered 2018-04-20: 120 ug/kg/min via INTRAVENOUS

## 2018-04-20 MED ORDER — LIDOCAINE HCL (PF) 2 % IJ SOLN
INTRAMUSCULAR | Status: AC
  Filled 2018-04-20: qty 10

## 2018-04-20 MED ORDER — FENTANYL CITRATE (PF) 100 MCG/2ML IJ SOLN
INTRAMUSCULAR | Status: DC | PRN
  Administered 2018-04-20: 50 ug via INTRAVENOUS

## 2018-04-20 NOTE — H&P (Signed)
Primary Care Physician:  Rusty Aus, MD Primary Gastroenterologist:  Dr. Vira Agar  Pre-Procedure History & Physical: HPI:  Christopher Daniels is a 84 y.o. male is here for an endoscopy and colonoscopy.   Past Medical History:  Diagnosis Date  . Arthritis    "joints; fingers, knees" (03/06/2017)  . CAD (coronary artery disease)    a. 01/2016 Inf STEMI->PCI/Thrombectomy/DES to mid RCA (4.0 x 30 Resolute DES), EF 25-30%; b. 01/2016 Staged PCI/DES to mLAD (3.0x22 Resolute DES); c. 10/2016 Cath: LM nl, LAD patent stent, 30d, D2 70, LCX nl, OM1/2 nl, RCA patent mid/dist stent.  . CLL (chronic lymphocytic leukemia) ([place])    "never taken any RX; just monitor this twice/year" (03/06/2017)  . Diverticulitis   . GERD (gastroesophageal reflux disease)   . H/O Bell's palsy 1960s  . HFrEF (heart failure with reduced ejection fraction) ([place])   . High cholesterol   . History of kidney stones   . HOH (hard of hearing)   . Iron deficiency anemia   . Ischemic cardiomyopathy    a. 01/2016 EF 25-30%; b. 04/2016 f/u echo: EF 36%, mild glob HK, basal and mid inflat, inf, infsept, mid apical inf AK, mildly dil LA/RA, mild AI/MR, trace TR; c. 06/2016 Cardiac MRI: EF 36%, apical AK, mod diff HK, septal-lateral dyssynchrony  . LBBB (left bundle branch block)   . Mobitz type 2 second degree AV block 03/06/2017  . Myocardial infarction ([place]) 01/27/2016  . Presence of permanent cardiac pacemaker   . Skin cancer    a. on head years ago  . Tachy-brady syndrome ([place])    a. 02/2017 s/p SJM 3262 Quadra Allur MP CRT-P.    Past Surgical History:  Procedure Laterality Date  . BACK SURGERY    . BI-VENTRICULAR PACEMAKER INSERTION (CRT-P)  03/06/2017  . BIV PACEMAKER INSERTION CRT-P N/A 03/06/2017   Procedure: BIV PACEMAKER INSERTION CRT-P;  Surgeon: Evans Lance, MD;  Location: [place] [facility];  Service: Cardiovascular;  Laterality: N/A;  . CARDIAC CATHETERIZATION N/A 07/29/2014   Procedure: Left Heart Cath;   Surgeon: Teodoro Spray, MD;  Location: [place] [facility];  Service: Cardiovascular;  Laterality: N/A;  . CARDIAC CATHETERIZATION N/A 01/27/2016   Procedure: Left Heart Cath and Coronary Angiography;  Surgeon: [name], MD;  Location: [place] [facility];  Service: Cardiovascular;  Laterality: N/A;  . CARDIAC CATHETERIZATION N/A 01/27/2016   Procedure: Coronary Stent Intervention;  Surgeon: [name], MD;  Location: [place] [facility];  Service: Cardiovascular;  Laterality: N/A;  . CARDIAC CATHETERIZATION N/A 02/13/2016   Procedure: Coronary Stent Intervention;  Surgeon: [name], MD;  Location: [place] [facility];  Service: Cardiovascular;  Laterality: N/A;  . CARDIAC CATHETERIZATION N/A 02/13/2016   Procedure: Left Heart Cath and Coronary Angiography;  Surgeon: [name], MD;  Location: [place] [facility];  Service: Cardiovascular;  Laterality: N/A;  . CARDIAC CATHETERIZATION    . CATARACT EXTRACTION W/PHACO Right 07/18/2014   Procedure: CATARACT EXTRACTION PHACO AND INTRAOCULAR LENS PLACEMENT (IOC);  Surgeon: Estill Cotta, MD;  Location: [facility];  Service: Ophthalmology;  Laterality: Right;  [place] 03:11 AP% 28.4 CDE 75.64  . CATARACT EXTRACTION W/PHACO Left 06/19/2015   Procedure: CATARACT EXTRACTION PHACO AND INTRAOCULAR LENS PLACEMENT (IOC);  Surgeon: Estill Cotta, MD;  Location: [facility];  Service: Ophthalmology;  Laterality: Left;  [place] 02:36.5 AP% 27.7 CDE 69.51 fluid pack lot # [id number]  . EYE SURGERY    . INGUINAL HERNIA REPAIR  Left 2010  . INSERT / REPLACE / REMOVE PACEMAKER    . LEFT HEART CATH AND CORONARY ANGIOGRAPHY N/A 11/18/2016   Procedure: LEFT HEART CATH AND CORONARY ANGIOGRAPHY;  Surgeon: Wellington Hampshire, MD;  Location: [place] [facility];  Service: Cardiovascular;  Laterality: N/A;  . LUMBAR [place]   "ruptured disc; took a piece off my buttocks and fixed it"  . SKIN CANCER EXCISION     "top of my head"  . TONSILLECTOMY      Prior to Admission  medications   Medication Sig Start Date End Date Taking? Authorizing Provider  aspirin 81 MG tablet Take 81 mg by mouth daily.    Yes [provider]  atorvastatin (LIPITOR) 80 MG tablet Take 1 tablet (80 mg total) by mouth every evening. 01/02/18  Yes Theora Gianotti, NP  BRILINTA 90 MG TABS tablet TAKE ONE TABLET BY MOUTH TWICE DAILY 09/09/17  Yes Deboraha Sprang, MD  carvedilol (COREG) 3.125 MG tablet TAKE ONE TABLET BY MOUTH TWICE DAILY 03/05/18  Yes Deboraha Sprang, MD  co-enzyme Q-10 30 MG capsule Take 30 mg by mouth daily.   Yes [provider]  Cyanocobalamin (B-12) 500 MCG SUBL Place 500 mcg under the tongue every other day.    Yes [provider]  Iron-Vitamin C (VITRON-C) 65-125 MG TABS Take 1 tablet by mouth 2 (two) times daily. Taking M,W F   Yes [provider]  lisinopril (PRINIVIL,ZESTRIL) 5 MG tablet Take 1/2 tablet (2.5 mg) by mouth once daily 03/10/18  Yes Deboraha Sprang, MD  pantoprazole (PROTONIX) 40 MG tablet Take 40 mg by mouth 2 (two) times daily.    Yes [provider]  spironolactone (ALDACTONE) 25 MG tablet Take 0.5 tablets (12.5 mg total) by mouth daily. 02/06/18  Yes Deboraha Sprang, MD  traMADol (ULTRAM) 50 MG tablet Take 50 mg by mouth every 6 (six) hours as needed.   Yes [provider]  carboxymethylcellulose (REFRESH TEARS) 0.5 % SOLN Place 1 drop into both eyes 3 (three) times daily as needed (for dry eyes).     [provider]  nitroGLYCERIN (NITROSTAT) 0.4 MG SL tablet Place 1 tablet (0.4 mg total) under the tongue every 5 (five) minutes x 3 doses as needed for chest pain. 01/29/16   [name], MD    Allergies as of 03/11/2018 - Review Complete 03/10/2018  Allergen Reaction Noted  . Niaspan [niacin er] Anaphylaxis and Other (See Comments) 07/29/2014  . Adhesive [tape] Other (See Comments) 03/06/2017    Family History  Problem Relation Age of Onset  . Heart failure Father     Social  History   Socioeconomic History  . Marital status: Married    Spouse name: Not on file  . Number of children: Not on file  . Years of education: Not on file  . Highest education level: Not on file  Occupational History  . Not on file  Social Needs  . Financial resource strain: Not on file  . Food insecurity:    Worry: Not on file    Inability: Not on file  . Transportation needs:    Medical: Not on file    Non-medical: Not on file  Tobacco Use  . Smoking status: Former Smoker    Years: 4.00    Types: Pipe, Cigars    Last attempt to quit: 1960    Years since quitting: 60.1  . Smokeless tobacco: Former Systems developer  Types: Sarina Ser    Quit date: 19  Substance and Sexual Activity  . Alcohol use: No  . Drug use: Never  . Sexual activity: Not Currently  Lifestyle  . Physical activity:    Days per week: Not on file    Minutes per session: Not on file  . Stress: Not on file  Relationships  . Social connections:    Talks on phone: Not on file    Gets together: Not on file    Attends religious service: Not on file    Active member of club or organization: Not on file    Attends meetings of clubs or organizations: Not on file    Relationship status: Not on file  . Intimate partner violence:    Fear of current or ex partner: Not on file    Emotionally abused: Not on file    Physically abused: Not on file    Forced sexual activity: Not on file  Other Topics Concern  . Not on file  Social History Narrative   Lives in [place] with his wife.  She has RA and he helps to take care of her.  He is very active in his yard and around the house.    Review of Systems: See HPI, otherwise negative ROS  Physical Exam: BP 119/70   Pulse 80   Temp 97.9 F (36.6 C) (Tympanic)   Resp 18   Ht 5\' 11"  (1.803 m)   Wt 82.6 kg   SpO2 100%   BMI 25.38 kg/m  General:   Alert,  pleasant and cooperative in NAD Head:  Normocephalic and atraumatic. Neck:  Supple; no masses or thyromegaly. Lungs:   Clear throughout to auscultation.    Heart:  Regular rate and rhythm. Abdomen:  Soft, nontender and nondistended. Normal bowel sounds, without guarding, and without rebound.   Neurologic:  Alert and  oriented x4;  grossly normal neurologically.  Impression/Plan: Kern Alberta is here for an endoscopy and colonoscopy to be performed for [facility] colon polyps and GERD with heartburn.  Risks, benefits, limitations, and alternatives regarding  endoscopy and colonoscopy have been reviewed with the patient.  Questions have been answered.  All parties agreeable.   Gaylyn Cheers, MD  04/20/2018, 10:43 AM

## 2018-04-20 NOTE — Anesthesia Preprocedure Evaluation (Signed)
Anesthesia Evaluation  Patient identified by MRN, date of birth, ID band Patient awake    Reviewed: Allergy & Precautions, H&P , NPO status , Patient's Chart, lab work & pertinent test results  History of Anesthesia Complications Negative for: history of anesthetic complications  Airway Mallampati: III  TM Distance: <3 FB Neck ROM: limited    Dental  (+) Chipped, Poor Dentition, Missing, Partial Lower, Upper Dentures   Pulmonary neg shortness of breath, former smoker,           Cardiovascular Exercise Tolerance: Good (-) angina+ CAD and + Past MI  (-) DOE + dysrhythmias + pacemaker      Neuro/Psych negative neurological ROS  negative psych ROS   GI/Hepatic Neg liver ROS, GERD  Medicated and Controlled,  Endo/Other  negative endocrine ROS  Renal/GU negative Renal ROS  negative genitourinary   Musculoskeletal  (+) Arthritis ,   Abdominal   Peds  Hematology negative hematology ROS (+)   Anesthesia Other Findings Past Medical History: No date: Arthritis     Comment:  "joints; fingers, knees" (03/06/2017) No date: CAD (coronary artery disease)     Comment:  a. 01/2016 Inf STEMI->PCI/Thrombectomy/DES to mid RCA               (4.0 x 30 Resolute DES), EF 25-30%; b. 01/2016 Staged               PCI/DES to mLAD (3.0x22 Resolute DES); c. 10/2016 Cath: LM              nl, LAD patent stent, 30d, D2 70, LCX nl, OM1/2 nl, RCA               patent mid/dist stent. No date: CLL (chronic lymphocytic leukemia) ([facility])     Comment:  "never taken any RX; just monitor this twice/year"               (03/06/2017) No date: Diverticulitis No date: GERD (gastroesophageal reflux disease) 1960s: H/O Bell's palsy No date: HFrEF (heart failure with reduced ejection fraction) ([place]) No date: High cholesterol No date: History of kidney stones No date: HOH (hard of hearing) No date: Iron deficiency anemia No date: Ischemic cardiomyopathy      Comment:  a. 01/2016 EF 25-30%; b. 04/2016 f/u echo: EF 36%, mild               glob HK, basal and mid inflat, inf, infsept, mid apical               inf AK, mildly dil LA/RA, mild AI/MR, trace TR; c. 06/2016              Cardiac MRI: EF 36%, apical AK, mod diff HK,               septal-lateral dyssynchrony No date: LBBB (left bundle branch block) 03/06/2017: Mobitz type 2 second degree AV block 01/27/2016: Myocardial infarction ([place]) No date: Presence of permanent cardiac pacemaker No date: Skin cancer     Comment:  a. on head years ago No date: Tachy-brady syndrome ([place])     Comment:  a. 02/2017 s/p SJM 3262 Quadra Allur MP CRT-P.  Past Surgical History: No date: BACK SURGERY 03/06/2017: BI-VENTRICULAR PACEMAKER INSERTION (CRT-P) 03/06/2017: BIV PACEMAKER INSERTION CRT-P; N/A     Comment:  Procedure: BIV PACEMAKER INSERTION CRT-P;  Surgeon:               Evans Lance, MD;  Location: [facility]  [facility];                Service: Cardiovascular;  Laterality: N/A; 07/29/2014: CARDIAC CATHETERIZATION; N/A     Comment:  Procedure: Left Heart Cath;  Surgeon: Teodoro Spray,               MD;  Location: [place] [facility];  Service:               Cardiovascular;  Laterality: N/A; 01/27/2016: CARDIAC CATHETERIZATION; N/A     Comment:  Procedure: Left Heart Cath and Coronary Angiography;                Surgeon: [name], MD;  Location: [place] [facility];                Service: Cardiovascular;  Laterality: N/A; 01/27/2016: CARDIAC CATHETERIZATION; N/A     Comment:  Procedure: Coronary Stent Intervention;  Surgeon: [name], MD;  Location: [place] [facility];  Service:               Cardiovascular;  Laterality: N/A; 02/13/2016: CARDIAC CATHETERIZATION; N/A     Comment:  Procedure: Coronary Stent Intervention;  Surgeon: [name], MD;  Location: [place] [facility];  Service:               Cardiovascular;  Laterality: N/A; 02/13/2016: CARDIAC CATHETERIZATION;  N/A     Comment:  Procedure: Left Heart Cath and Coronary Angiography;                Surgeon: [name], MD;  Location: [place] [facility];                Service: Cardiovascular;  Laterality: N/A; No date: CARDIAC CATHETERIZATION 07/18/2014: CATARACT EXTRACTION W/PHACO; Right     Comment:  Procedure: CATARACT EXTRACTION PHACO AND INTRAOCULAR               LENS PLACEMENT (IOC);  Surgeon: Estill Cotta, MD;                Location: [facility];  Service: Ophthalmology;  Laterality:              Right;  [place] 03:11 AP% 28.4 CDE 75.64 06/19/2015: CATARACT EXTRACTION W/PHACO; Left     Comment:  Procedure: CATARACT EXTRACTION PHACO AND INTRAOCULAR               LENS PLACEMENT (IOC);  Surgeon: Estill Cotta, MD;                Location: [facility];  Service: Ophthalmology;  Laterality:              Left;  [place] 02:36.5 AP% 27.7 CDE 69.51 fluid pack lot #               [id number] No date: EYE SURGERY 2010: [place]; Left No date: INSERT / REPLACE / REMOVE PACEMAKER 11/18/2016: LEFT HEART CATH AND CORONARY ANGIOGRAPHY; N/A     Comment:  Procedure: LEFT HEART CATH AND CORONARY ANGIOGRAPHY;                Surgeon: Wellington Hampshire, MD;  Location: [place]               [facility];  Service: Cardiovascular;  Laterality: N/A; 1983: [facility]  [facility]     Comment:  "ruptured disc; took a piece off my buttocks and fixed               it" No date: SKIN CANCER EXCISION     Comment:  "top of my head" No date: TONSILLECTOMY  BMI    Body Mass Index:  25.38 kg/m      Reproductive/Obstetrics negative OB ROS                             Anesthesia Physical Anesthesia Plan  ASA: IV  Anesthesia Plan: General   Post-op Pain Management:    Induction: Intravenous  PONV Risk Score and Plan: Propofol infusion and TIVA  Airway Management Planned: Natural Airway and Nasal Cannula  Additional Equipment:   Intra-op Plan:   Post-operative Plan:    Informed Consent: I have reviewed the patients History and Physical, chart, labs and discussed the procedure including the risks, benefits and alternatives for the proposed anesthesia with the patient or authorized representative who has indicated his/her understanding and acceptance.     Dental Advisory Given  Plan Discussed with: Anesthesiologist, CRNA and Surgeon  Anesthesia Plan Comments: (Patient consented for risks of anesthesia including but not limited to:  - adverse reactions to medications - risk of intubation if required - damage to teeth, lips or other oral mucosa - sore throat or hoarseness - Damage to heart, brain, lungs or loss of life  Patient voiced understanding.)        Anesthesia Quick Evaluation

## 2018-04-20 NOTE — Anesthesia Procedure Notes (Signed)
Performed by: Cook-Martin, Roshni Burbano Pre-anesthesia Checklist: Patient identified, Emergency Drugs available, Suction available, Patient being monitored and Timeout performed Patient Re-evaluated:Patient Re-evaluated prior to induction Oxygen Delivery Method: Nasal cannula Preoxygenation: Pre-oxygenation with 100% oxygen Induction Type: IV induction Placement Confirmation: positive ETCO2 and CO2 detector       

## 2018-04-20 NOTE — Op Note (Signed)
[facility] Patient Name: Christopher Daniels Procedure Date: 04/20/2018 10:44 AM MRN: [id number] Account #: [id number] Date of Birth: 07-Dec-1934 Admit Type: Outpatient Age: 84 Room: [facility] ENDO ROOM 1 Gender: Male Note Status: Finalized Procedure:            Colonoscopy Indications:          High risk colon cancer surveillance: Personal history                        of colonic polyps Providers:            Manya Silvas, MD Referring MD:         Clayborne Artist, MD (Referring MD), Rusty Aus, MD                        (Referring MD) Medicines:            Propofol per Anesthesia Complications:        No immediate complications. Procedure:            Pre-Anesthesia Assessment:                       - After reviewing the risks and benefits, the patient                        was deemed in satisfactory condition to undergo the                        procedure.                       After obtaining informed consent, the colonoscope was                        passed under direct vision. Throughout the procedure,                        the patient's blood pressure, pulse, and oxygen                        saturations were monitored continuously. The                        Colonoscope was introduced through the anus and                        advanced to the the cecum, identified by appendiceal                        orifice and ileocecal valve. The colonoscopy was                        performed without difficulty. The patient tolerated the                        procedure well. The quality of the bowel preparation                        was excellent. Findings:      Multiple small-mouthed diverticula were found in the sigmoid colon,       descending colon, transverse colon and  ascending colon.      Internal hemorrhoids were found during endoscopy. The hemorrhoids were       small and Grade I (internal hemorrhoids that do not prolapse).      The exam  was otherwise without abnormality. Impression:           - Diverticulosis in the sigmoid colon, in the                        descending colon, in the transverse colon and in the                        ascending colon.                       - Internal hemorrhoids.                       - The examination was otherwise normal.                       - No specimens collected. Recommendation:       - The findings and recommendations were discussed with                        the patient's family. Due to age I do not recommend a                        repeat colonoscopy. Manya Silvas, MD 04/20/2018 11:11:39 AM This report has been signed electronically. Number of Addenda: 0 Note Initiated On: 04/20/2018 10:44 AM Scope Withdrawal Time: 0 hours 4 minutes 1 second  Total Procedure Duration: 0 hours 7 minutes 24 seconds       [facility]

## 2018-04-20 NOTE — Op Note (Signed)
[facility]astroenter[facility] Patient Name: Christopher Daniels Procedure Date: 04/20/2018 10:45 AM MRN: [id number] Account #: [id number] Date of Birth: 1934-10-18 Admit Type: Outpatient Age: 84 Room: [facility] ENDO ROOM 1 Gender: Male Note Status: Finalized Procedure:            Upper GI endoscopy Indications:          Gastro-esophageal reflux disease Providers:            Manya Silvas, MD Referring MD:         Rusty Aus, MD (Referring MD) Medicines:            Propofol per Anesthesia Complications:        No immediate complications. Procedure:            Pre-Anesthesia Assessment:                       - After reviewing the risks and benefits, the patient                        was deemed in satisfactory condition to undergo the                        procedure.                       After obtaining informed consent, the endoscope was                        passed under direct vision. Throughout the procedure,                        the patient's blood pressure, pulse, and oxygen                        saturations were monitored continuously. The Endoscope                        was introduced through the mouth, and advanced to the                        second part of duodenum. The upper GI endoscopy was                        accomplished without difficulty. The patient tolerated                        the procedure well. Findings:      The examined esophagus was normal. GEJ at 36cm and 5cm hiatal hernia.      A medium-sized hiatal hernia was present. Few harmless gastric polyps.      The examined duodenum was normal. Impression:           - Normal esophagus.                       - Medium-sized hiatal hernia.                       - Normal examined duodenum.                       - No specimens collected. Recommendation:       -  Perform a colonoscopy today. Manya Silvas, MD 04/20/2018 10:57:12 AM This report has been signed electronically. Number of  Addenda: 0 Note Initiated On: 04/20/2018 10:45 AM      [facility]

## 2018-04-20 NOTE — Anesthesia Post-op Follow-up Note (Signed)
Anesthesia QCDR form completed.        

## 2018-04-20 NOTE — Transfer of Care (Signed)
    Immediate Anesthesia Transfer of Care Note  Patient: Christopher Daniels  Procedure(s) Performed: COLONOSCOPY WITH PROPOFOL (N/A ) ESOPHAGOGASTRODUODENOSCOPY (EGD) WITH PROPOFOL (N/A )  Patient Location: PACU  Anesthesia Type:General  Level of Consciousness: awake and sedated  Airway & Oxygen Therapy: Patient Spontanous Breathing and Patient connected to nasal cannula oxygen  Post-op Assessment: Report given to RN and Post -op Vital signs reviewed and stable  Post vital signs: Reviewed and stable  Last Vitals:  Vitals Value Taken Time  BP    Temp    Pulse    Resp    SpO2      Last Pain:  Vitals:   04/20/18 1017  TempSrc: Tympanic  PainSc: 0-No pain         Complications: No apparent anesthesia complications

## 2018-04-20 NOTE — Anesthesia Postprocedure Evaluation (Signed)
Anesthesia Post Note  Patient: Christopher Daniels  Procedure(s) Performed: COLONOSCOPY WITH PROPOFOL (N/A ) ESOPHAGOGASTRODUODENOSCOPY (EGD) WITH PROPOFOL (N/A )  Patient location during evaluation: Endoscopy Anesthesia Type: General Level of consciousness: awake and alert Pain management: pain level controlled Vital Signs Assessment: post-procedure vital signs reviewed and stable Respiratory status: spontaneous breathing, nonlabored ventilation, respiratory function stable and patient connected to nasal cannula oxygen Cardiovascular status: blood pressure returned to baseline and stable Postop Assessment: no apparent nausea or vomiting Anesthetic complications: no     Last Vitals:  Vitals:   04/20/18 1130 04/20/18 1140  BP: 129/68 128/65  Pulse: 61 (!) 59  Resp: 20 17  Temp:    SpO2: 98% 94%    Last Pain:  Vitals:   04/20/18 1140  TempSrc:   PainSc: 0-No pain                 Precious Haws Piscitello

## 2018-04-28 DIAGNOSIS — D2262 Melanocytic nevi of left upper limb, including shoulder: Secondary | ICD-10-CM | POA: Diagnosis not present

## 2018-04-28 DIAGNOSIS — D2261 Melanocytic nevi of right upper limb, including shoulder: Secondary | ICD-10-CM | POA: Diagnosis not present

## 2018-04-28 DIAGNOSIS — D2272 Melanocytic nevi of left lower limb, including hip: Secondary | ICD-10-CM | POA: Diagnosis not present

## 2018-04-28 DIAGNOSIS — X32XXXA Exposure to sunlight, initial encounter: Secondary | ICD-10-CM | POA: Diagnosis not present

## 2018-04-28 DIAGNOSIS — R208 Other disturbances of skin sensation: Secondary | ICD-10-CM | POA: Diagnosis not present

## 2018-04-28 DIAGNOSIS — Z08 Encounter for follow-up examination after completed treatment for malignant neoplasm: Secondary | ICD-10-CM | POA: Diagnosis not present

## 2018-04-28 DIAGNOSIS — L570 Actinic keratosis: Secondary | ICD-10-CM | POA: Diagnosis not present

## 2018-04-28 DIAGNOSIS — B079 Viral wart, unspecified: Secondary | ICD-10-CM | POA: Diagnosis not present

## 2018-04-28 DIAGNOSIS — D485 Neoplasm of uncertain behavior of skin: Secondary | ICD-10-CM | POA: Diagnosis not present

## 2018-04-28 DIAGNOSIS — Z85828 Personal history of other malignant neoplasm of skin: Secondary | ICD-10-CM | POA: Diagnosis not present

## 2018-05-04 ENCOUNTER — Other Ambulatory Visit: Payer: Self-pay | Admitting: Internal Medicine

## 2018-05-07 DIAGNOSIS — K449 Diaphragmatic hernia without obstruction or gangrene: Secondary | ICD-10-CM | POA: Diagnosis not present

## 2018-05-07 DIAGNOSIS — D509 Iron deficiency anemia, unspecified: Secondary | ICD-10-CM | POA: Diagnosis not present

## 2018-05-07 DIAGNOSIS — E538 Deficiency of other specified B group vitamins: Secondary | ICD-10-CM | POA: Diagnosis not present

## 2018-05-07 DIAGNOSIS — K219 Gastro-esophageal reflux disease without esophagitis: Secondary | ICD-10-CM | POA: Diagnosis not present

## 2018-05-07 DIAGNOSIS — Z86010 Personal history of colonic polyps: Secondary | ICD-10-CM | POA: Diagnosis not present

## 2018-05-07 DIAGNOSIS — C9110 Chronic lymphocytic leukemia of B-cell type not having achieved remission: Secondary | ICD-10-CM | POA: Diagnosis not present

## 2018-05-07 DIAGNOSIS — K5790 Diverticulosis of intestine, part unspecified, without perforation or abscess without bleeding: Secondary | ICD-10-CM | POA: Diagnosis not present

## 2018-05-08 ENCOUNTER — Telehealth: Payer: Self-pay | Admitting: Internal Medicine

## 2018-05-08 DIAGNOSIS — D509 Iron deficiency anemia, unspecified: Secondary | ICD-10-CM | POA: Diagnosis not present

## 2018-05-08 NOTE — Telephone Encounter (Signed)
New Message   Christopher Daniels with [facility] is calling to advise that the patient has a drop in his hemoglobin from 11.2 to 8.8. Patient is on Brilinta and Gertie Fey wanted to update Dr. Caryl Comes as an Juluis Rainier. No call back is needed. Results can be found in care everywhere.

## 2018-05-09 NOTE — Telephone Encounter (Signed)
Noted  

## 2018-05-13 ENCOUNTER — Other Ambulatory Visit (HOSPITAL_COMMUNITY): Payer: Self-pay | Admitting: Internal Medicine

## 2018-05-13 ENCOUNTER — Other Ambulatory Visit: Payer: Self-pay | Admitting: Internal Medicine

## 2018-05-13 ENCOUNTER — Other Ambulatory Visit: Payer: Self-pay | Admitting: Oncology

## 2018-05-13 ENCOUNTER — Other Ambulatory Visit: Payer: Self-pay

## 2018-05-13 ENCOUNTER — Ambulatory Visit
Admission: RE | Admit: 2018-05-13 | Discharge: 2018-05-13 | Disposition: A | Discharge status: Home / Self Care | Payer: PPO | Source: Ambulatory Visit | Attending: Internal Medicine | Admitting: Internal Medicine

## 2018-05-13 DIAGNOSIS — K5790 Diverticulosis of intestine, part unspecified, without perforation or abscess without bleeding: Secondary | ICD-10-CM | POA: Diagnosis not present

## 2018-05-13 DIAGNOSIS — I495 Sick sinus syndrome: Secondary | ICD-10-CM | POA: Diagnosis not present

## 2018-05-13 DIAGNOSIS — D649 Anemia, unspecified: Secondary | ICD-10-CM | POA: Diagnosis not present

## 2018-05-13 DIAGNOSIS — C9110 Chronic lymphocytic leukemia of B-cell type not having achieved remission: Secondary | ICD-10-CM | POA: Diagnosis not present

## 2018-05-13 DIAGNOSIS — R109 Unspecified abdominal pain: Secondary | ICD-10-CM

## 2018-05-13 DIAGNOSIS — D500 Iron deficiency anemia secondary to blood loss (chronic): Secondary | ICD-10-CM | POA: Diagnosis not present

## 2018-05-13 DIAGNOSIS — R1032 Left lower quadrant pain: Secondary | ICD-10-CM | POA: Diagnosis not present

## 2018-05-13 DIAGNOSIS — R634 Abnormal weight loss: Secondary | ICD-10-CM

## 2018-05-13 DIAGNOSIS — R079 Chest pain, unspecified: Secondary | ICD-10-CM

## 2018-05-13 DIAGNOSIS — K8020 Calculus of gallbladder without cholecystitis without obstruction: Secondary | ICD-10-CM | POA: Diagnosis not present

## 2018-05-13 DIAGNOSIS — I700 Atherosclerosis of aorta: Secondary | ICD-10-CM | POA: Diagnosis not present

## 2018-05-13 DIAGNOSIS — R0602 Shortness of breath: Secondary | ICD-10-CM | POA: Diagnosis not present

## 2018-05-13 DIAGNOSIS — I420 Dilated cardiomyopathy: Secondary | ICD-10-CM | POA: Diagnosis not present

## 2018-05-13 DIAGNOSIS — R1084 Generalized abdominal pain: Secondary | ICD-10-CM | POA: Diagnosis not present

## 2018-05-13 DIAGNOSIS — D509 Iron deficiency anemia, unspecified: Secondary | ICD-10-CM

## 2018-05-13 LAB — POCT I-STAT CREATININE: CREATININE: 1 mg/dL (ref 0.61–1.24)

## 2018-05-13 MED ORDER — IOHEXOL 300 MG/ML  SOLN
100.0000 mL | Freq: Once | INTRAMUSCULAR | Status: AC | PRN
  Administered 2018-05-13: 100 mL via INTRAVENOUS

## 2018-05-18 ENCOUNTER — Other Ambulatory Visit: Payer: Self-pay

## 2018-05-19 ENCOUNTER — Inpatient Hospital Stay: Payer: PPO | Attending: Oncology | Admitting: Oncology

## 2018-05-19 ENCOUNTER — Inpatient Hospital Stay: Payer: PPO

## 2018-05-19 ENCOUNTER — Encounter: Payer: Self-pay | Admitting: Oncology

## 2018-05-19 ENCOUNTER — Other Ambulatory Visit: Payer: Self-pay

## 2018-05-19 VITALS — BP 126/67 | HR 76 | Temp 97.6°F | Resp 20 | Ht 71.0 in | Wt 185.0 lb

## 2018-05-19 VITALS — BP 132/74 | HR 67 | Resp 17

## 2018-05-19 DIAGNOSIS — D509 Iron deficiency anemia, unspecified: Secondary | ICD-10-CM | POA: Diagnosis not present

## 2018-05-19 DIAGNOSIS — Z87891 Personal history of nicotine dependence: Secondary | ICD-10-CM

## 2018-05-19 DIAGNOSIS — C9110 Chronic lymphocytic leukemia of B-cell type not having achieved remission: Secondary | ICD-10-CM | POA: Diagnosis not present

## 2018-05-19 MED ORDER — SODIUM CHLORIDE 0.9 % IV SOLN
Freq: Once | INTRAVENOUS | Status: AC
  Administered 2018-05-19: 14:00:00 via INTRAVENOUS
  Filled 2018-05-19: qty 250

## 2018-05-19 MED ORDER — SODIUM CHLORIDE 0.9 % IV SOLN
510.0000 mg | Freq: Once | INTRAVENOUS | Status: AC
  Administered 2018-05-19: 510 mg via INTRAVENOUS
  Filled 2018-05-19: qty 17

## 2018-05-20 NOTE — Progress Notes (Signed)
[place]  [phone] [phone]  ID: Christopher Daniels OB: 11-29-1934  MR#: [id number]  JKK#:[id number]  Patient Care Team: Rusty Aus, MD as PCP - General (Internal Medicine) Deboraha Sprang, MD as PCP - Cardiology (Cardiology)  CHIEF COMPLAINT: CLL, iron deficiency anemia.  INTERVAL HISTORY: Patient returns to clinic today as an add-on after laboratory work indicated progressive iron deficiency anemia.  Colonoscopy and EGD were unrevealing.  Patient currently feels well and is asymptomatic.  He does not complain of weakness or fatigue.  He has no neurologic complaints.  He denies any recent fevers or illnesses.  He denies any night sweats.  He has a good appetite and denies weight loss. He denies any chest pain, cough, hemoptysis, or shortness of breath.  He denies any nausea, vomiting, constipation, or diarrhea.  He has no melena or hematochezia.  He has no urinary complaints.  Patient feels at his baseline offers no specific complaints today.  REVIEW OF SYSTEMS:   Review of Systems  Constitutional: Negative.  Negative for fever, malaise/fatigue and weight loss.  Respiratory: Negative.  Negative for cough and shortness of breath.   Cardiovascular: Negative.  Negative for chest pain and leg swelling.  Gastrointestinal: Negative.  Negative for abdominal pain, blood in stool, constipation and melena.  Genitourinary: Negative.  Negative for dysuria and hematuria.  Musculoskeletal: Negative.  Negative for back pain.  Skin: Negative.  Negative for rash.  Neurological: Negative.  Negative for sensory change, focal weakness and weakness.  Endo/Heme/Allergies: Does not bruise/bleed easily.  Psychiatric/Behavioral: Negative.  The patient is not nervous/anxious.     As per HPI. Otherwise, a complete review of systems is negative.  PAST MEDICAL HISTORY: Past Medical History:  Diagnosis Date   Arthritis    "joints; fingers, knees" (03/06/2017)    CAD (coronary artery disease)    a. 01/2016 Inf STEMI->PCI/Thrombectomy/DES to mid RCA (4.0 x 30 Resolute DES), EF 25-30%; b. 01/2016 Staged PCI/DES to mLAD (3.0x22 Resolute DES); c. 10/2016 Cath: LM nl, LAD patent stent, 30d, D2 70, LCX nl, OM1/2 nl, RCA patent mid/dist stent.   CLL (chronic lymphocytic leukemia) ([place])    "never taken any RX; just monitor this twice/year" (03/06/2017)   Diverticulitis    GERD (gastroesophageal reflux disease)    H/O Bell's palsy 1960s   HFrEF (heart failure with reduced ejection fraction) (H[facility])    High cholesterol    History of kidney stones    HOH (hard of hearing)    Iron deficiency anemia    Ischemic cardiomyopathy    a. 01/2016 EF 25-30%; b. 04/2016 f/u echo: EF 36%, mild glob HK, basal and mid inflat, inf, infsept, mid apical inf AK, mildly dil LA/RA, mild AI/MR, trace TR; c. 06/2016 Cardiac MRI: EF 36%, apical AK, mod diff HK, septal-lateral dyssynchrony   LBBB (left bundle branch block)    Mobitz type 2 second degree AV block 03/06/2017   Myocardial infarction ([place]) 01/27/2016   Presence of permanent cardiac pacemaker    Skin cancer    a. on head years ago   Tachy-brady syndrome ([place])    a. 02/2017 s/p SJM 3262 Quadra Allur MP CRT-P.    PAST SURGICAL HISTORY: Past Surgical History:  Procedure Laterality Date   BACK SURGERY     BI-VENTRICULAR PACEMAKER INSERTION (CRT-P)  03/06/2017   BIV PACEMAKER INSERTION CRT-P N/A 03/06/2017   Procedure: BIV PACEMAKER INSERTION CRT-P;  Surgeon: Evans Lance, MD;  Location: [place] [facility];  Service: Cardiovascular;  Laterality: N/A;   CARDIAC CATHETERIZATION N/A 07/29/2014   Procedure: Left Heart Cath;  Surgeon: Teodoro Spray, MD;  Location: [place] [facility];  Service: Cardiovascular;  Laterality: N/A;   CARDIAC CATHETERIZATION N/A 01/27/2016   Procedure: Left Heart Cath and Coronary Angiography;  Surgeon: [name], MD;  Location: [place] [facility];  Service: Cardiovascular;   Laterality: N/A;   CARDIAC CATHETERIZATION N/A 01/27/2016   Procedure: Coronary Stent Intervention;  Surgeon: [name], MD;  Location: [place] [facility];  Service: Cardiovascular;  Laterality: N/A;   CARDIAC CATHETERIZATION N/A 02/13/2016   Procedure: Coronary Stent Intervention;  Surgeon: [name], MD;  Location: [place] [facility];  Service: Cardiovascular;  Laterality: N/A;   CARDIAC CATHETERIZATION N/A 02/13/2016   Procedure: Left Heart Cath and Coronary Angiography;  Surgeon: [name], MD;  Location: [place] [facility];  Service: Cardiovascular;  Laterality: N/A;   CARDIAC CATHETERIZATION     CATARACT EXTRACTION W/PHACO Right 07/18/2014   Procedure: CATARACT EXTRACTION PHACO AND INTRAOCULAR LENS PLACEMENT (IOC);  Surgeon: Estill Cotta, MD;  Location: [facility];  Service: Ophthalmology;  Laterality: Right;  [place] 03:11 AP% 28.4 CDE 75.64   CATARACT EXTRACTION W/PHACO Left 06/19/2015   Procedure: CATARACT EXTRACTION PHACO AND INTRAOCULAR LENS PLACEMENT (IOC);  Surgeon: Estill Cotta, MD;  Location: [facility];  Service: Ophthalmology;  Laterality: Left;  [place] 02:36.5 AP% 27.7 CDE 69.51 fluid pack lot # [id number]   COLONOSCOPY WITH PROPOFOL N/A 04/20/2018   Procedure: COLONOSCOPY WITH PROPOFOL;  Surgeon: Manya Silvas, MD;  Location: [facility];  Service: Endoscopy;  Laterality: N/A;   ESOPHAGOGASTRODUODENOSCOPY (EGD) WITH PROPOFOL N/A 04/20/2018   Procedure: ESOPHAGOGASTRODUODENOSCOPY (EGD) WITH PROPOFOL;  Surgeon: Manya Silvas, MD;  Location: [facility];  Service: Endoscopy;  Laterality: N/A;   EYE SURGERY     INGUINAL HERNIA REPAIR Left 2010   INSERT / REPLACE / REMOVE PACEMAKER     LEFT HEART CATH AND CORONARY ANGIOGRAPHY N/A 11/18/2016   Procedure: LEFT HEART CATH AND CORONARY ANGIOGRAPHY;  Surgeon: Wellington Hampshire, MD;  Location: [place] [facility];  Service: Cardiovascular;  Laterality: N/A;   [place]   "ruptured disc; took a piece  off my buttocks and fixed it"   SKIN CANCER EXCISION     "top of my head"   TONSILLECTOMY      FAMILY HISTORY: Reviewed and unchanged. No reported history of malignancy or chronic disease.     ADVANCED DIRECTIVES:    HEALTH MAINTENANCE: Social History   Tobacco Use   Smoking status: Former Smoker    Years: 4.00    Types: Pipe, Cigars    Last attempt to quit: 1960    Years since quitting: 60.2   Smokeless tobacco: Former Systems developer    Types: [place] date: 1960  Substance Use Topics   Alcohol use: No   Drug use: Never     Colonoscopy:  PAP:  Bone density:  Lipid panel:  Allergies  Allergen Reactions   Niaspan [Niacin Er] Anaphylaxis and Other (See Comments)    Episode happened 10 to 15 years ago.  Has not tried any other similar meds since   Adhesive [Tape] Other (See Comments)    Skin is very sensitive; PLEASE USE PAPER TAPE!!    Current Outpatient Medications  Medication Sig Dispense Refill   aspirin 81 MG tablet Take 81 mg by mouth daily.      atorvastatin (LIPITOR) 80  MG tablet Take 1 tablet (80 mg total) by mouth every evening. 90 tablet 3   BRILINTA 90 MG TABS tablet TAKE ONE TABLET BY MOUTH TWICE DAILY 180 tablet 3   carboxymethylcellulose (REFRESH TEARS) 0.5 % SOLN Place 1 drop into both eyes 3 (three) times daily as needed (for dry eyes).      carvedilol (COREG) 3.125 MG tablet TAKE ONE TABLET BY MOUTH TWICE DAILY 180 tablet 3   co-enzyme Q-10 30 MG capsule Take 30 mg by mouth daily.     Cyanocobalamin (B-12) 500 MCG SUBL Place 500 mcg under the tongue every other day.      ferrous gluconate (FERGON) 324 MG tablet Take 1 tablet by mouth 2 (two) times daily.     lisinopril (PRINIVIL,ZESTRIL) 5 MG tablet Take 2.5 mg by mouth daily.     nitroGLYCERIN (NITROSTAT) 0.4 MG SL tablet Place 1 tablet (0.4 mg total) under the tongue every 5 (five) minutes x 3 doses as needed for chest pain. 25 tablet 3   pantoprazole (PROTONIX) 40 MG tablet Take 40  mg by mouth 2 (two) times daily.      spironolactone (ALDACTONE) 25 MG tablet Take 0.5 tablets (12.5 mg total) by mouth daily. 45 tablet 1   No current facility-administered medications for this visit.     OBJECTIVE: Vitals:   05/19/18 1313  BP: 126/67  Pulse: 76  Resp: 20  Temp: 97.6 F (36.4 C)     Body mass index is 25.8 kg/m.    ECOG FS:0 - Asymptomatic  General: Well-developed, well-nourished, no acute distress. Eyes: Pink conjunctiva, anicteric sclera. HEENT: Normocephalic, moist mucous membranes. Lungs: Clear to auscultation bilaterally. Heart: Regular rate and rhythm. No rubs, murmurs, or gallops. Abdomen: Soft, nontender, nondistended. No organomegaly noted, normoactive bowel sounds. Musculoskeletal: No edema, cyanosis, or clubbing. Neuro: Alert, answering all questions appropriately. Cranial nerves grossly intact. Skin: No rashes or petechiae noted. Psych: Normal affect.  LAB RESULTS:  Lab Results  Component Value Date   NA 135 12/11/2017   K 4.3 12/11/2017   CL 103 12/11/2017   CO2 26 12/11/2017   GLUCOSE 84 12/11/2017   BUN 13 12/11/2017   CREATININE 1.00 05/13/2018   CALCIUM 9.0 12/11/2017   PROT 5.6 (L) 01/27/2016   ALBUMIN 3.4 (L) 01/27/2016   AST 21 01/27/2016   ALT 17 01/27/2016   ALKPHOS 53 01/27/2016   BILITOT 0.7 01/27/2016   GFRNONAA >60 12/11/2017   GFRAA >60 12/11/2017    Lab Results  Component Value Date   WBC 12.9 (H) 12/11/2017   NEUTROABS 3.3 12/11/2017   HGB 10.9 (L) 12/11/2017   HCT 34.1 (L) 12/11/2017   MCV 96.3 12/11/2017   PLT 252 12/11/2017     STUDIES: Ct Chest W Contrast  Result Date: 05/13/2018 CLINICAL DATA:  Left lower quadrant pain, recent endoscopy. Patient has anemia. EXAM: CT CHEST, ABDOMEN, AND PELVIS WITH CONTRAST TECHNIQUE: Multidetector CT imaging of the chest, abdomen and pelvis was performed following the standard protocol during bolus administration of intravenous contrast. CONTRAST:  179m OMNIPAQUE  IOHEXOL 300 MG/ML  SOLN COMPARISON:  None. FINDINGS: CT CHEST FINDINGS Cardiovascular: No significant vascular findings. Normal heart size. No pericardial effusion. Mediastinum/Nodes: No enlarged mediastinal, hilar, or axillary lymph nodes. Thyroid gland, trachea, and esophagus demonstrate no significant findings. Lungs/Pleura: Lungs are clear. No pleural effusion or pneumothorax. Musculoskeletal: No acute osseous abnormality. No aggressive osseous lesion. Generalized osteopenia. Chronic T11 and T12 vertebral body compression fractures. CT ABDOMEN PELVIS FINDINGS Hepatobiliary:  No focal liver abnormality is seen. Cholelithiasis. No gallbladder wall thickening or pericholecystic inflammatory changes. No intrahepatic or extrahepatic biliary ductal dilatation. Pancreas: Unremarkable. No pancreatic ductal dilatation or surrounding inflammatory changes. Spleen: Normal in size without focal abnormality. Adrenals/Urinary Tract: Adrenal glands are unremarkable. Kidneys are normal, without renal calculi, focal lesion, or hydronephrosis. Bladder is unremarkable. Stomach/Bowel: Stomach is within normal limits. Appendix appears normal. No evidence of bowel wall thickening, distention, or inflammatory changes. Diverticulosis without evidence of diverticulitis. Vascular/Lymphatic: No significant vascular findings are present. No enlarged abdominal or pelvic lymph nodes. Abdominal aortic atherosclerosis. Reproductive: Prostate is unremarkable. Other: No abdominal wall hernia or abnormality. No abdominopelvic ascites. Prior ventral abdominal hernia repair. Musculoskeletal: Chronic compression fractures of the L1, L2, L3, L4 and L5 vertebral bodies. No aggressive osseous lesion. Mild osteoarthritis of sacroiliac joints. IMPRESSION: 1. No acute abnormality of the chest, abdomen or pelvis. 2.  Aortic Atherosclerosis (ICD10-I70.0). 3. Diverticulosis without evidence of diverticulitis. 4. Cholelithiasis. Electronically Signed   By:  Kathreen Devoid   On: 05/13/2018 11:41   Ct Abdomen Pelvis W Contrast  Result Date: 05/13/2018 CLINICAL DATA:  Left lower quadrant pain, recent endoscopy. Patient has anemia. EXAM: CT CHEST, ABDOMEN, AND PELVIS WITH CONTRAST TECHNIQUE: Multidetector CT imaging of the chest, abdomen and pelvis was performed following the standard protocol during bolus administration of intravenous contrast. CONTRAST:  158m OMNIPAQUE IOHEXOL 300 MG/ML  SOLN COMPARISON:  None. FINDINGS: CT CHEST FINDINGS Cardiovascular: No significant vascular findings. Normal heart size. No pericardial effusion. Mediastinum/Nodes: No enlarged mediastinal, hilar, or axillary lymph nodes. Thyroid gland, trachea, and esophagus demonstrate no significant findings. Lungs/Pleura: Lungs are clear. No pleural effusion or pneumothorax. Musculoskeletal: No acute osseous abnormality. No aggressive osseous lesion. Generalized osteopenia. Chronic T11 and T12 vertebral body compression fractures. CT ABDOMEN PELVIS FINDINGS Hepatobiliary: No focal liver abnormality is seen. Cholelithiasis. No gallbladder wall thickening or pericholecystic inflammatory changes. No intrahepatic or extrahepatic biliary ductal dilatation. Pancreas: Unremarkable. No pancreatic ductal dilatation or surrounding inflammatory changes. Spleen: Normal in size without focal abnormality. Adrenals/Urinary Tract: Adrenal glands are unremarkable. Kidneys are normal, without renal calculi, focal lesion, or hydronephrosis. Bladder is unremarkable. Stomach/Bowel: Stomach is within normal limits. Appendix appears normal. No evidence of bowel wall thickening, distention, or inflammatory changes. Diverticulosis without evidence of diverticulitis. Vascular/Lymphatic: No significant vascular findings are present. No enlarged abdominal or pelvic lymph nodes. Abdominal aortic atherosclerosis. Reproductive: Prostate is unremarkable. Other: No abdominal wall hernia or abnormality. No abdominopelvic ascites.  Prior ventral abdominal hernia repair. Musculoskeletal: Chronic compression fractures of the L1, L2, L3, L4 and L5 vertebral bodies. No aggressive osseous lesion. Mild osteoarthritis of sacroiliac joints. IMPRESSION: 1. No acute abnormality of the chest, abdomen or pelvis. 2.  Aortic Atherosclerosis (ICD10-I70.0). 3. Diverticulosis without evidence of diverticulitis. 4. Cholelithiasis. Electronically Signed   By: HKathreen Devoid  On: 05/13/2018 11:41    ASSESSMENT: CLL, Rai stage 0.  Iron deficiency anemia.  PLAN:    1.  CLL: Patient's most recent white blood cell count on May 07, 2018 was mildly decreased at 11.3 which is approximately his baseline.  CT in August 2014 was reviewed independently and did not reveal any underlying lymphadenopathy.  This does not need to be repeated unless there is concern of progression of disease.  No intervention is needed at this time.  Patient does not require bone marrow biopsy.  Continue to monitor along with iron deficiency. 2.  Iron deficiency anemia: On May 07, 2018 patient was noted to have a  hemoglobin of 8.8, ferritin of 8, total iron of 21, and a percent saturation of 6%.  Colonoscopy and EGD on April 20, 2018 did not reveal any distinct pathology or evidence of bleeding.  Proceed with 510 mg IV Feraheme today.  Return to clinic in 1 week for second infusion.  Patient will then return to clinic in 4 months with repeat laboratory work and further evaluation.  I spent a total of 30 minutes face-to-face with the patient of which greater than 50% of the visit was spent in counseling and coordination of care as detailed above.    Patient expressed understanding and was in agreement with this plan. He also understands that He can call clinic at any time with any questions, concerns, or complaints.    Lloyd Huger, MD   05/20/2018 6:57 AM

## 2018-05-25 ENCOUNTER — Other Ambulatory Visit: Payer: Self-pay

## 2018-05-25 DIAGNOSIS — M79671 Pain in right foot: Secondary | ICD-10-CM | POA: Diagnosis not present

## 2018-05-25 DIAGNOSIS — D2372 Other benign neoplasm of skin of left lower limb, including hip: Secondary | ICD-10-CM | POA: Diagnosis not present

## 2018-05-26 ENCOUNTER — Other Ambulatory Visit: Payer: Self-pay

## 2018-05-26 ENCOUNTER — Inpatient Hospital Stay: Payer: PPO

## 2018-05-26 VITALS — BP 106/66 | HR 74 | Temp 96.0°F | Resp 18

## 2018-05-26 DIAGNOSIS — D509 Iron deficiency anemia, unspecified: Secondary | ICD-10-CM

## 2018-05-26 MED ORDER — SODIUM CHLORIDE 0.9 % IV SOLN
510.0000 mg | Freq: Once | INTRAVENOUS | Status: AC
  Administered 2018-05-26: 510 mg via INTRAVENOUS
  Filled 2018-05-26: qty 17

## 2018-05-26 MED ORDER — SODIUM CHLORIDE 0.9 % IV SOLN
Freq: Once | INTRAVENOUS | Status: AC
  Administered 2018-05-26: 14:00:00 via INTRAVENOUS
  Filled 2018-05-26: qty 250

## 2018-06-01 ENCOUNTER — Other Ambulatory Visit: Payer: Self-pay | Admitting: Internal Medicine

## 2018-06-01 DIAGNOSIS — Z79899 Other long term (current) drug therapy: Secondary | ICD-10-CM | POA: Diagnosis not present

## 2018-06-01 DIAGNOSIS — E7800 Pure hypercholesterolemia, unspecified: Secondary | ICD-10-CM | POA: Diagnosis not present

## 2018-06-01 DIAGNOSIS — M109 Gout, unspecified: Secondary | ICD-10-CM | POA: Diagnosis not present

## 2018-06-01 DIAGNOSIS — M069 Rheumatoid arthritis, unspecified: Secondary | ICD-10-CM | POA: Diagnosis not present

## 2018-06-01 DIAGNOSIS — M461 Sacroiliitis, not elsewhere classified: Secondary | ICD-10-CM | POA: Diagnosis not present

## 2018-06-01 DIAGNOSIS — S39012A Strain of muscle, fascia and tendon of lower back, initial encounter: Secondary | ICD-10-CM | POA: Diagnosis not present

## 2018-06-01 DIAGNOSIS — M1990 Unspecified osteoarthritis, unspecified site: Secondary | ICD-10-CM | POA: Diagnosis not present

## 2018-06-01 DIAGNOSIS — J449 Chronic obstructive pulmonary disease, unspecified: Secondary | ICD-10-CM | POA: Diagnosis not present

## 2018-06-05 ENCOUNTER — Ambulatory Visit: Payer: PPO | Admitting: Oncology

## 2018-06-05 ENCOUNTER — Ambulatory Visit: Payer: PPO

## 2018-06-09 ENCOUNTER — Other Ambulatory Visit: Payer: Self-pay

## 2018-06-09 ENCOUNTER — Ambulatory Visit (INDEPENDENT_AMBULATORY_CARE_PROVIDER_SITE_OTHER): Payer: PPO | Admitting: *Deleted

## 2018-06-09 DIAGNOSIS — I442 Atrioventricular block, complete: Secondary | ICD-10-CM

## 2018-06-09 LAB — CUP PACEART REMOTE DEVICE CHECK
Battery Remaining Longevity: 74 mo
Battery Remaining Percentage: 95.5 %
Battery Voltage: 2.98 V
Brady Statistic AP VP Percent: 81 %
Brady Statistic AP VS Percent: 1 %
Brady Statistic AS VP Percent: 17 %
Brady Statistic AS VS Percent: 1.3 %
Brady Statistic RA Percent Paced: 80 %
Date Time Interrogation Session: 20200414102422
Implantable Lead Implant Date: 20190110
Implantable Lead Implant Date: 20190110
Implantable Lead Implant Date: 20190110
Implantable Lead Location: 753858
Implantable Lead Location: 753859
Implantable Lead Location: 753860
Implantable Pulse Generator Implant Date: 20190110
Lead Channel Impedance Value: 380 Ohm
Lead Channel Impedance Value: 590 Ohm
Lead Channel Impedance Value: 790 Ohm
Lead Channel Pacing Threshold Amplitude: 0.5 V
Lead Channel Pacing Threshold Amplitude: 0.5 V
Lead Channel Pacing Threshold Amplitude: 1.5 V
Lead Channel Pacing Threshold Pulse Width: 0.5 ms
Lead Channel Pacing Threshold Pulse Width: 0.5 ms
Lead Channel Pacing Threshold Pulse Width: 1.5 ms
Lead Channel Sensing Intrinsic Amplitude: 12 mV
Lead Channel Sensing Intrinsic Amplitude: 5 mV
Lead Channel Setting Pacing Amplitude: 2 V
Lead Channel Setting Pacing Amplitude: 2.5 V
Lead Channel Setting Pacing Amplitude: 2.5 V
Lead Channel Setting Pacing Pulse Width: 0.5 ms
Lead Channel Setting Pacing Pulse Width: 1.5 ms
Lead Channel Setting Sensing Sensitivity: 2 mV
Pulse Gen Model: 3262
Pulse Gen Serial Number: 8942222

## 2018-06-15 DIAGNOSIS — M79672 Pain in left foot: Secondary | ICD-10-CM | POA: Diagnosis not present

## 2018-06-15 DIAGNOSIS — D2372 Other benign neoplasm of skin of left lower limb, including hip: Secondary | ICD-10-CM | POA: Diagnosis not present

## 2018-06-16 ENCOUNTER — Encounter: Payer: Self-pay | Admitting: Cardiology

## 2018-06-16 NOTE — Progress Notes (Signed)
Remote pacemaker transmission.   

## 2018-06-16 NOTE — Progress Notes (Signed)
Letter  

## 2018-06-17 ENCOUNTER — Other Ambulatory Visit: Payer: PPO

## 2018-06-17 ENCOUNTER — Ambulatory Visit: Payer: PPO | Admitting: Oncology

## 2018-08-07 ENCOUNTER — Other Ambulatory Visit: Payer: PPO

## 2018-08-07 ENCOUNTER — Telehealth: Payer: Self-pay

## 2018-08-07 DIAGNOSIS — D649 Anemia, unspecified: Secondary | ICD-10-CM | POA: Diagnosis not present

## 2018-08-07 DIAGNOSIS — Z20822 Contact with and (suspected) exposure to covid-19: Secondary | ICD-10-CM

## 2018-08-07 NOTE — Telephone Encounter (Signed)
Rec'd call from [facility] with request to schedule pt. For COVID 19 Test.  Reported pt. Is starting to experience symptoms. Requesting physician is Dr. Emily Filbert.  Phone # [phone] and Fax # [phone].  Appt. Offered at 3:45 PM.  Advised pt. Should wear mask and drive up to test site, and to remain in car.  Stated she will give pt. the information.

## 2018-08-09 LAB — NOVEL CORONAVIRUS, NAA: SARS-CoV-2, NAA: NOT DETECTED

## 2018-08-20 DIAGNOSIS — E538 Deficiency of other specified B group vitamins: Secondary | ICD-10-CM | POA: Diagnosis not present

## 2018-08-20 DIAGNOSIS — I420 Dilated cardiomyopathy: Secondary | ICD-10-CM | POA: Diagnosis not present

## 2018-08-20 DIAGNOSIS — D500 Iron deficiency anemia secondary to blood loss (chronic): Secondary | ICD-10-CM | POA: Diagnosis not present

## 2018-08-20 DIAGNOSIS — M8588 Other specified disorders of bone density and structure, other site: Secondary | ICD-10-CM | POA: Diagnosis not present

## 2018-08-20 DIAGNOSIS — C9110 Chronic lymphocytic leukemia of B-cell type not having achieved remission: Secondary | ICD-10-CM | POA: Diagnosis not present

## 2018-08-27 DIAGNOSIS — E538 Deficiency of other specified B group vitamins: Secondary | ICD-10-CM | POA: Diagnosis not present

## 2018-08-27 DIAGNOSIS — E782 Mixed hyperlipidemia: Secondary | ICD-10-CM | POA: Diagnosis not present

## 2018-08-27 DIAGNOSIS — Z125 Encounter for screening for malignant neoplasm of prostate: Secondary | ICD-10-CM | POA: Diagnosis not present

## 2018-08-27 DIAGNOSIS — I420 Dilated cardiomyopathy: Secondary | ICD-10-CM | POA: Diagnosis not present

## 2018-09-08 ENCOUNTER — Ambulatory Visit (INDEPENDENT_AMBULATORY_CARE_PROVIDER_SITE_OTHER): Payer: PPO | Admitting: *Deleted

## 2018-09-08 DIAGNOSIS — I441 Atrioventricular block, second degree: Secondary | ICD-10-CM

## 2018-09-08 DIAGNOSIS — I420 Dilated cardiomyopathy: Secondary | ICD-10-CM

## 2018-09-08 LAB — CUP PACEART REMOTE DEVICE CHECK
Date Time Interrogation Session: 20200714095841
Implantable Lead Implant Date: 20190110
Implantable Lead Implant Date: 20190110
Implantable Lead Implant Date: 20190110
Implantable Lead Location: 753858
Implantable Lead Location: 753859
Implantable Lead Location: 753860
Implantable Pulse Generator Implant Date: 20190110
Pulse Gen Model: 3262
Pulse Gen Serial Number: 8942222

## 2018-09-10 DIAGNOSIS — M810 Age-related osteoporosis without current pathological fracture: Secondary | ICD-10-CM | POA: Diagnosis not present

## 2018-09-13 NOTE — Progress Notes (Signed)
[place]  [phone] [phone]  ID: Kern Alberta OB: [birth year]  MR#: [id number]  EHM#:[id number]  Patient Care Team: Rusty Aus, MD as PCP - General (Internal Medicine) Deboraha Sprang, MD as PCP - Cardiology (Cardiology)  CHIEF COMPLAINT: CLL, iron deficiency anemia.  INTERVAL HISTORY: Patient returns to clinic today for repeat laboratory work and further evaluation.  He feels significantly improved after receiving IV Feraheme several months ago.  He does not complain of weakness or fatigue today.  He has no neurologic complaints.  He denies any recent fevers or illnesses.  He denies any night sweats.  He has a good appetite and denies weight loss. He denies any chest pain, cough, hemoptysis, or shortness of breath.  He denies any nausea, vomiting, constipation, or diarrhea.  He has no melena or hematochezia.  He has no urinary complaints.  Patient was at his baseline offers no specific complaints today.  REVIEW OF SYSTEMS:   Review of Systems  Constitutional: Negative.  Negative for fever, malaise/fatigue and weight loss.  Respiratory: Negative.  Negative for cough and shortness of breath.   Cardiovascular: Negative.  Negative for chest pain and leg swelling.  Gastrointestinal: Negative.  Negative for abdominal pain, blood in stool, constipation and melena.  Genitourinary: Negative.  Negative for dysuria and hematuria.  Musculoskeletal: Negative.  Negative for back pain.  Skin: Negative.  Negative for rash.  Neurological: Negative.  Negative for sensory change, focal weakness and weakness.  Endo/Heme/Allergies: Does not bruise/bleed easily.  Psychiatric/Behavioral: Negative.  The patient is not nervous/anxious.     As per HPI. Otherwise, a complete review of systems is negative.  PAST MEDICAL HISTORY: Past Medical History:  Diagnosis Date  . Arthritis    "joints; fingers, knees" (03/06/2017)  . CAD (coronary artery disease)    a. 01/2016 Inf STEMI->PCI/Thrombectomy/DES to mid RCA (4.0 x 30 Resolute DES), EF 25-30%; b. 01/2016 Staged PCI/DES to mLAD (3.0x22 Resolute DES); c. 10/2016 Cath: LM nl, LAD patent stent, 30d, D2 70, LCX nl, OM1/2 nl, RCA patent mid/dist stent.  . CLL (chronic lymphocytic leukemia) ([place])    "never taken any RX; just monitor this twice/year" (03/06/2017)  . Diverticulitis   . GERD (gastroesophageal reflux disease)   . H/O Bell's palsy 1960s  . HFrEF (heart failure with reduced ejection fraction) ([place])   . High cholesterol   . History of kidney stones   . HOH (hard of hearing)   . Iron deficiency anemia   . Ischemic cardiomyopathy    a. 01/2016 EF 25-30%; b. 04/2016 f/u echo: EF 36%, mild glob HK, basal and mid inflat, inf, infsept, mid apical inf AK, mildly dil LA/RA, mild AI/MR, trace TR; c. 06/2016 Cardiac MRI: EF 36%, apical AK, mod diff HK, septal-lateral dyssynchrony  . LBBB (left bundle branch block)   . Mobitz type 2 second degree AV block 03/06/2017  . Myocardial infarction ([place]) 01/27/2016  . Presence of permanent cardiac pacemaker   . Skin cancer    a. on head years ago  . Tachy-brady syndrome ([place])    a. 02/2017 s/p SJM 3262 Quadra Allur MP CRT-P.    PAST SURGICAL HISTORY: Past Surgical History:  Procedure Laterality Date  . BACK SURGERY    . BI-VENTRICULAR PACEMAKER INSERTION (CRT-P)  03/06/2017  . BIV PACEMAKER INSERTION CRT-P N/A 03/06/2017   Procedure: BIV PACEMAKER INSERTION CRT-P;  Surgeon: Evans Lance, MD;  Location: [facility];  Service: Cardiovascular;  Laterality: N/A;  .  CARDIAC CATHETERIZATION N/A 07/29/2014   Procedure: Left Heart Cath;  Surgeon: Teodoro Spray, MD;  Location: [place] [facility];  Service: Cardiovascular;  Laterality: N/A;  . CARDIAC CATHETERIZATION N/A 01/27/2016   Procedure: Left Heart Cath and Coronary Angiography;  Surgeon: [name], MD;  Location: [place] [facility];  Service: Cardiovascular;  Laterality: N/A;  . CARDIAC  CATHETERIZATION N/A 01/27/2016   Procedure: Coronary Stent Intervention;  Surgeon: [name], MD;  Location: [place] [facility];  Service: Cardiovascular;  Laterality: N/A;  . CARDIAC CATHETERIZATION N/A 02/13/2016   Procedure: Coronary Stent Intervention;  Surgeon: [name], MD;  Location: [place] [facility];  Service: Cardiovascular;  Laterality: N/A;  . CARDIAC CATHETERIZATION N/A 02/13/2016   Procedure: Left Heart Cath and Coronary Angiography;  Surgeon: [name], MD;  Location: [place];  Service: Cardiovascular;  Laterality: N/A;  . CARDIAC CATHETERIZATION    . CATARACT EXTRACTION W/PHACO Right 07/18/2014   Procedure: CATARACT EXTRACTION PHACO AND INTRAOCULAR LENS PLACEMENT (IOC);  Surgeon: Estill Cotta, MD;  Location: [facility];  Service: Ophthalmology;  Laterality: Right;  [place] 03:11 AP% 28.4 CDE 75.64  . CATARACT EXTRACTION W/PHACO Left 06/19/2015   Procedure: CATARACT EXTRACTION PHACO AND INTRAOCULAR LENS PLACEMENT (IOC);  Surgeon: Estill Cotta, MD;  Location: [facility];  Service: Ophthalmology;  Laterality: Left;  [place] 02:36.5 AP% 27.7 CDE 69.51 fluid pack lot # [id number]  . COLONOSCOPY WITH PROPOFOL N/A 04/20/2018   Procedure: COLONOSCOPY WITH PROPOFOL;  Surgeon: Manya Silvas, MD;  Location: [facility];  Service: Endoscopy;  Laterality: N/A;  . ESOPHAGOGASTRODUODENOSCOPY (EGD) WITH PROPOFOL N/A 04/20/2018   Procedure: ESOPHAGOGASTRODUODENOSCOPY (EGD) WITH PROPOFOL;  Surgeon: Manya Silvas, MD;  Location: [facility];  Service: Endoscopy;  Laterality: N/A;  . EYE SURGERY    . INGUINAL HERNIA REPAIR Left 2010  . INSERT / REPLACE / REMOVE PACEMAKER    . LEFT HEART CATH AND CORONARY ANGIOGRAPHY N/A 11/18/2016   Procedure: LEFT HEART CATH AND CORONARY ANGIOGRAPHY;  Surgeon: Wellington Hampshire, MD;  Location: [place] [facility];  Service: Cardiovascular;  Laterality: N/A;  . LUMBAR [place]   "ruptured disc; took a piece off my buttocks and fixed  it"  . SKIN CANCER EXCISION     "top of my head"  . TONSILLECTOMY      FAMILY HISTORY: Reviewed and unchanged. No reported history of malignancy or chronic disease.     ADVANCED DIRECTIVES:    HEALTH MAINTENANCE: Social History   Tobacco Use  . Smoking status: Former Smoker    Years: 4.00    Types: Pipe, Cigars    Quit date: 1960    Years since quitting: 60.6  . Smokeless tobacco: Former Systems developer    Types: Chew    Quit date: 1960  Substance Use Topics  . Alcohol use: No  . Drug use: Never     Colonoscopy:  PAP:  Bone density:  Lipid panel:  Allergies  Allergen Reactions  . Niaspan [Niacin Er] Anaphylaxis and Other (See Comments)    Episode happened 10 to 15 years ago.  Has not tried any other similar meds since  . Adhesive [Tape] Other (See Comments)    Skin is very sensitive; PLEASE USE PAPER TAPE!!    Current Outpatient Medications  Medication Sig Dispense Refill  . alendronate (FOSAMAX) 70 MG tablet Take 1 tablet by mouth once a week.     Marland Kitchen aspirin 81 MG tablet Take 81 mg by mouth  daily.     . atorvastatin (LIPITOR) 80 MG tablet Take 1 tablet (80 mg total) by mouth every evening. 90 tablet 3  . BRILINTA 90 MG TABS tablet TAKE ONE TABLET BY MOUTH TWICE DAILY 180 tablet 3  . carboxymethylcellulose (REFRESH TEARS) 0.5 % SOLN Place 1 drop into both eyes 3 (three) times daily as needed (for dry eyes).     . carvedilol (COREG) 3.125 MG tablet TAKE ONE TABLET BY MOUTH TWICE DAILY 180 tablet 3  . co-enzyme Q-10 30 MG capsule Take 30 mg by mouth daily.    . Cyanocobalamin (B-12) 500 MCG SUBL Place 500 mcg under the tongue every other day.     . ferrous gluconate (FERGON) 324 MG tablet Take 1 tablet by mouth 2 (two) times daily.    Marland Kitchen lisinopril (PRINIVIL,ZESTRIL) 5 MG tablet Take 2.5 mg by mouth daily.    . pantoprazole (PROTONIX) 40 MG tablet Take 40 mg by mouth 2 (two) times daily.     Marland Kitchen spironolactone (ALDACTONE) 25 MG tablet TAKE 1/2 TABLET BY MOUTH DAILY 45 tablet 1   . sucralfate (CARAFATE) 1 GM/10ML suspension Take 1 g by mouth 4 (four) times daily.    . nitroGLYCERIN (NITROSTAT) 0.4 MG SL tablet Place 1 tablet (0.4 mg total) under the tongue every 5 (five) minutes x 3 doses as needed for chest pain. (Patient not taking: Reported on 09/17/2018) 25 tablet 3   No current facility-administered medications for this visit.     OBJECTIVE: Vitals:   09/17/18 1354  BP: 122/78  Pulse: 78  Temp: (!) 97.3 F (36.3 C)     Body mass index is 26.98 kg/m.    ECOG FS:0 - Asymptomatic  General: Well-developed, well-nourished, no acute distress. Eyes: Pink conjunctiva, anicteric sclera. HEENT: Normocephalic, moist mucous membranes. Lungs: Clear to auscultation bilaterally. Heart: Regular rate and rhythm. No rubs, murmurs, or gallops. Abdomen: Soft, nontender, nondistended. No organomegaly noted, normoactive bowel sounds. Musculoskeletal: No edema, cyanosis, or clubbing. Neuro: Alert, answering all questions appropriately. Cranial nerves grossly intact. Skin: No rashes or petechiae noted. Psych: Normal affect.  LAB RESULTS:  Lab Results  Component Value Date   NA 135 12/11/2017   K 4.3 12/11/2017   CL 103 12/11/2017   CO2 26 12/11/2017   GLUCOSE 84 12/11/2017   BUN 13 12/11/2017   CREATININE 1.00 05/13/2018   CALCIUM 9.0 12/11/2017   PROT 5.6 (L) 01/27/2016   ALBUMIN 3.4 (L) 01/27/2016   AST 21 01/27/2016   ALT 17 01/27/2016   ALKPHOS 53 01/27/2016   BILITOT 0.7 01/27/2016   GFRNONAA >60 12/11/2017   GFRAA >60 12/11/2017    Lab Results  Component Value Date   WBC 7.4 09/17/2018   NEUTROABS 2.8 09/17/2018   HGB 11.4 (L) 09/17/2018   HCT 35.1 (L) 09/17/2018   MCV 99.4 09/17/2018   PLT 203 09/17/2018   Lab Results  Component Value Date   IRON 75 09/17/2018   TIBC 271 09/17/2018   IRONPCTSAT 28 09/17/2018   Lab Results  Component Value Date   FERRITIN 26 09/17/2018     STUDIES: No results found.  ASSESSMENT: CLL, Rai stage 0.   Iron deficiency anemia.  PLAN:    1.  CLL: Patient's white blood cell count is within normal limits today. CT in August 2014 was reviewed independently and did not reveal any underlying lymphadenopathy.  This does not need to be repeated unless there is concern of progression of disease.  No intervention is  needed at this time.  Patient does not require bone marrow biopsy.  Repeat flow cytometry at next clinic visit.  Continue to monitor along with iron deficiency. 2.  Iron deficiency anemia: Patient's hemoglobin has significantly improved and is now nearly within normal limits.  His iron stores are now within normal limits. Colonoscopy and EGD on April 20, 2018 did not reveal any distinct pathology or evidence of bleeding.  Patient does not require additional IV Feraheme today.  Return to clinic in 4 months with repeat laboratory work, further evaluation, and consideration of additional treatment.     Patient expressed understanding and was in agreement with this plan. He also understands that He can call clinic at any time with any questions, concerns, or complaints.    Lloyd Huger, MD   09/18/2018 7:16 AM

## 2018-09-17 ENCOUNTER — Encounter: Payer: Self-pay | Admitting: Oncology

## 2018-09-17 ENCOUNTER — Inpatient Hospital Stay (HOSPITAL_BASED_OUTPATIENT_CLINIC_OR_DEPARTMENT_OTHER): Payer: PPO | Admitting: Oncology

## 2018-09-17 ENCOUNTER — Inpatient Hospital Stay: Payer: PPO

## 2018-09-17 ENCOUNTER — Other Ambulatory Visit: Payer: Self-pay

## 2018-09-17 ENCOUNTER — Inpatient Hospital Stay: Payer: PPO | Attending: Oncology

## 2018-09-17 VITALS — BP 122/78 | HR 78 | Temp 97.3°F | Ht 69.0 in | Wt 182.7 lb

## 2018-09-17 DIAGNOSIS — D509 Iron deficiency anemia, unspecified: Secondary | ICD-10-CM

## 2018-09-17 DIAGNOSIS — Z87891 Personal history of nicotine dependence: Secondary | ICD-10-CM

## 2018-09-17 DIAGNOSIS — C9110 Chronic lymphocytic leukemia of B-cell type not having achieved remission: Secondary | ICD-10-CM | POA: Insufficient documentation

## 2018-09-17 LAB — CBC WITH DIFFERENTIAL/PLATELET
Abs Immature Granulocytes: 0.01 10*3/uL (ref 0.00–0.07)
Basophils Absolute: 0.1 10*3/uL (ref 0.0–0.1)
Basophils Relative: 1 %
Eosinophils Absolute: 0.2 10*3/uL (ref 0.0–0.5)
Eosinophils Relative: 3 %
HCT: 35.1 % — ABNORMAL LOW (ref 39.0–52.0)
Hemoglobin: 11.4 g/dL — ABNORMAL LOW (ref 13.0–17.0)
Immature Granulocytes: 0 %
Lymphocytes Relative: 49 %
Lymphs Abs: 3.6 10*3/uL (ref 0.7–4.0)
MCH: 32.3 pg (ref 26.0–34.0)
MCHC: 32.5 g/dL (ref 30.0–36.0)
MCV: 99.4 fL (ref 80.0–100.0)
Monocytes Absolute: 0.7 10*3/uL (ref 0.1–1.0)
Monocytes Relative: 10 %
Neutro Abs: 2.8 10*3/uL (ref 1.7–7.7)
Neutrophils Relative %: 37 %
Platelets: 203 10*3/uL (ref 150–400)
RBC: 3.53 MIL/uL — ABNORMAL LOW (ref 4.22–5.81)
RDW: 14.2 % (ref 11.5–15.5)
WBC: 7.4 10*3/uL (ref 4.0–10.5)
nRBC: 0 % (ref 0.0–0.2)

## 2018-09-17 LAB — IRON AND TIBC
Iron: 75 ug/dL (ref 45–182)
Saturation Ratios: 28 % (ref 17.9–39.5)
TIBC: 271 ug/dL (ref 250–450)
UIBC: 196 ug/dL

## 2018-09-17 LAB — FERRITIN: Ferritin: 26 ng/mL (ref 24–336)

## 2018-09-17 NOTE — Progress Notes (Signed)
Patient stated that he had been tired and fatigued. Patient denied fever, chills, diarrhea, constipation or blood loss.

## 2018-09-24 NOTE — Progress Notes (Signed)
Remote pacemaker transmission.   

## 2018-10-12 ENCOUNTER — Other Ambulatory Visit: Payer: Self-pay | Admitting: Internal Medicine

## 2018-10-13 ENCOUNTER — Other Ambulatory Visit: Payer: Self-pay | Admitting: Internal Medicine

## 2018-10-13 MED ORDER — TICAGRELOR 90 MG PO TABS: 90.0000 mg | ORAL_TABLET | Freq: Two times a day (BID) | ORAL | 3 refills | Status: DC

## 2018-10-13 NOTE — Telephone Encounter (Signed)
°*  STAT* If patient is at the pharmacy, call can be transferred to refill team.   1. Which medications need to be refilled? (please list name of each medication and dose if known) BRILINTA 90 mg 1 TABLET 2 TIMES DAILY   2. Which pharmacy/location (including street and city if local pharmacy) is medication to be sent to? [facility]   3. Do they need a 30 day or 90 day supply? 90 day

## 2018-10-20 DIAGNOSIS — K5903 Drug induced constipation: Secondary | ICD-10-CM | POA: Diagnosis not present

## 2018-10-20 DIAGNOSIS — K219 Gastro-esophageal reflux disease without esophagitis: Secondary | ICD-10-CM | POA: Diagnosis not present

## 2018-10-20 DIAGNOSIS — D500 Iron deficiency anemia secondary to blood loss (chronic): Secondary | ICD-10-CM | POA: Diagnosis not present

## 2018-12-02 DIAGNOSIS — Z23 Encounter for immunization: Secondary | ICD-10-CM | POA: Diagnosis not present

## 2018-12-09 ENCOUNTER — Ambulatory Visit (INDEPENDENT_AMBULATORY_CARE_PROVIDER_SITE_OTHER): Payer: PPO | Admitting: *Deleted

## 2018-12-09 DIAGNOSIS — I441 Atrioventricular block, second degree: Secondary | ICD-10-CM

## 2018-12-09 DIAGNOSIS — I420 Dilated cardiomyopathy: Secondary | ICD-10-CM

## 2018-12-10 LAB — CUP PACEART REMOTE DEVICE CHECK
Battery Remaining Longevity: 69 mo
Battery Remaining Percentage: 95.5 %
Battery Voltage: 2.96 V
Brady Statistic AP VP Percent: 84 %
Brady Statistic AP VS Percent: 1 %
Brady Statistic AS VP Percent: 14 %
Brady Statistic AS VS Percent: 1.2 %
Brady Statistic RA Percent Paced: 83 %
Date Time Interrogation Session: 20201013060015
Implantable Lead Implant Date: 20190110
Implantable Lead Implant Date: 20190110
Implantable Lead Implant Date: 20190110
Implantable Lead Location: 753858
Implantable Lead Location: 753859
Implantable Lead Location: 753860
Implantable Pulse Generator Implant Date: 20190110
Lead Channel Impedance Value: 380 Ohm
Lead Channel Impedance Value: 560 Ohm
Lead Channel Impedance Value: 800 Ohm
Lead Channel Pacing Threshold Amplitude: 0.5 V
Lead Channel Pacing Threshold Amplitude: 0.5 V
Lead Channel Pacing Threshold Amplitude: 1.5 V
Lead Channel Pacing Threshold Pulse Width: 0.5 ms
Lead Channel Pacing Threshold Pulse Width: 0.5 ms
Lead Channel Pacing Threshold Pulse Width: 1.5 ms
Lead Channel Sensing Intrinsic Amplitude: 12 mV
Lead Channel Sensing Intrinsic Amplitude: 3.2 mV
Lead Channel Setting Pacing Amplitude: 2 V
Lead Channel Setting Pacing Amplitude: 2.5 V
Lead Channel Setting Pacing Amplitude: 2.5 V
Lead Channel Setting Pacing Pulse Width: 0.5 ms
Lead Channel Setting Pacing Pulse Width: 1.5 ms
Lead Channel Setting Sensing Sensitivity: 2 mV
Pulse Gen Model: 3262
Pulse Gen Serial Number: 8942222

## 2018-12-15 ENCOUNTER — Other Ambulatory Visit: Payer: Self-pay | Admitting: Physician Assistant

## 2018-12-15 IMAGING — CT CT CHEST W/ CM
2 of 3 series · 14 of 31 positions shown, 17 images · IV contrast (iopamidol)
Comparison: CT chest, abdomen, and pelvis June 16, 2012

CLINICAL DATA: Iron deficiency anemia. History of chronic
lymphocytic leukemia.

EXAM:
CT CHEST, ABDOMEN, AND PELVIS WITH CONTRAST
TECHNIQUE: Multidetector CT imaging of the chest, abdomen and pelvis was
performed following the standard protocol during bolus
administration of intravenous contrast.
CONTRAST:  100mL YHIO15-PKK IOPAMIDOL (YHIO15-PKK) INJECTION 61%

[Series 2: cap with · axial · 0.78mm/px · z∈[-965,-455]mm · 10 of 128 slices shown, 13 images]
[im 13/128  mediastinal]
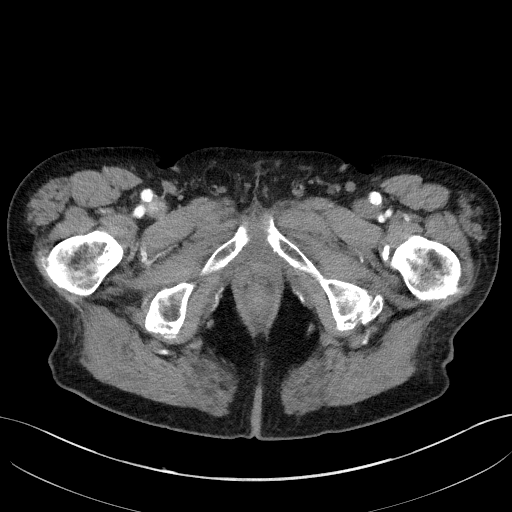
[im 13/128  lung]
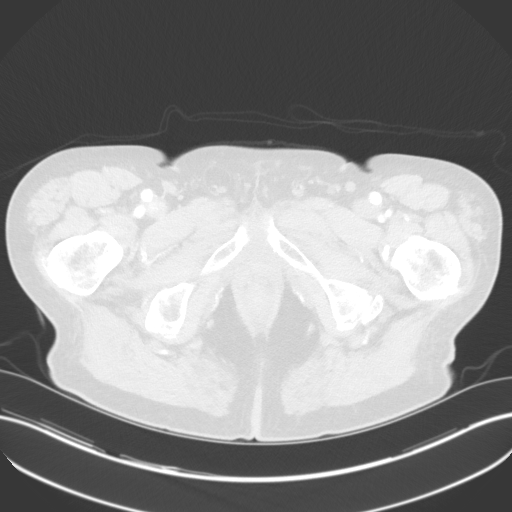
[im 26/128  lung]
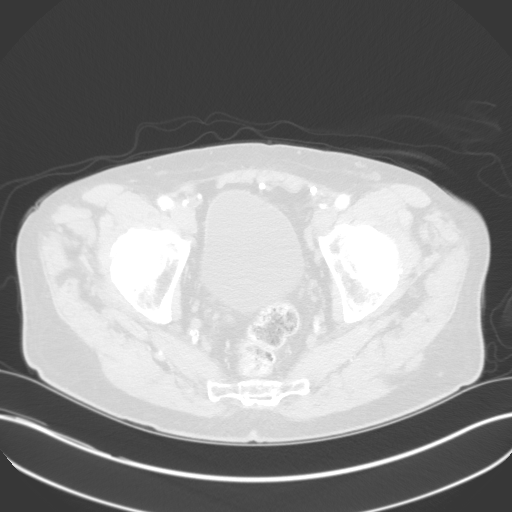
[im 39/128  lung]
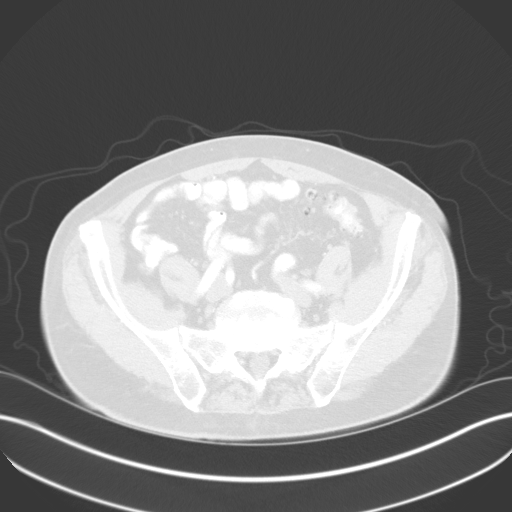
[im 51/128  lung]
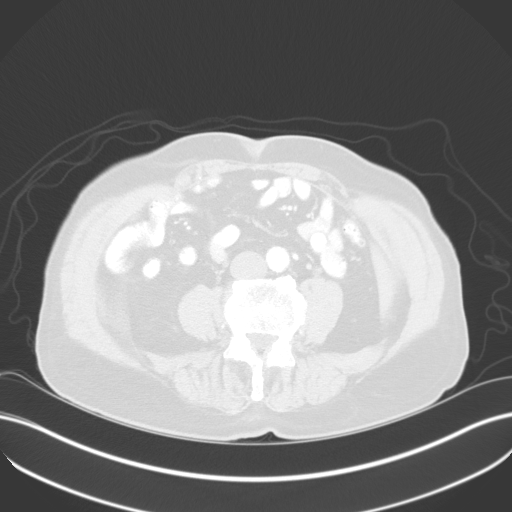
[im 63/128  mediastinal]
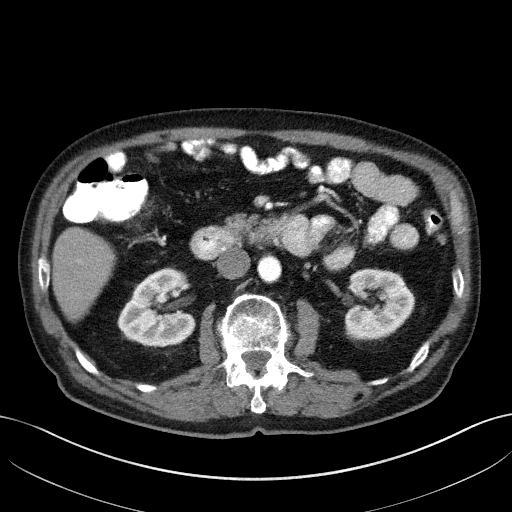
[im 63/128  lung]
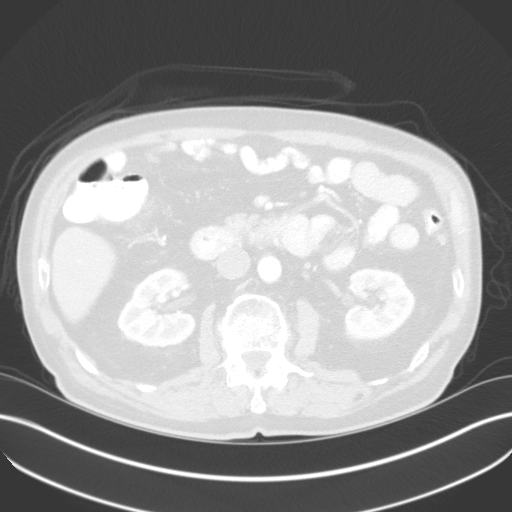
[im 64/128  lung]
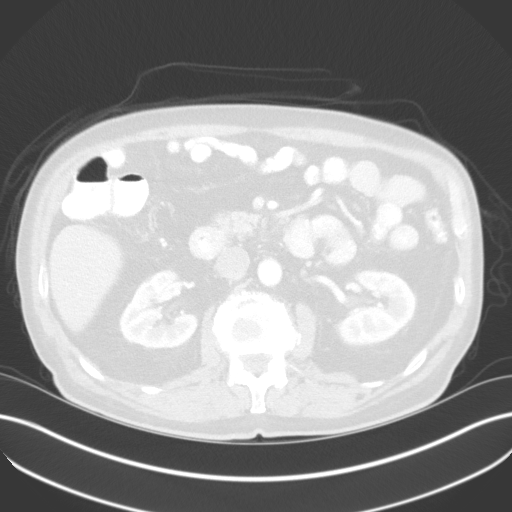
[im 77/128  lung]
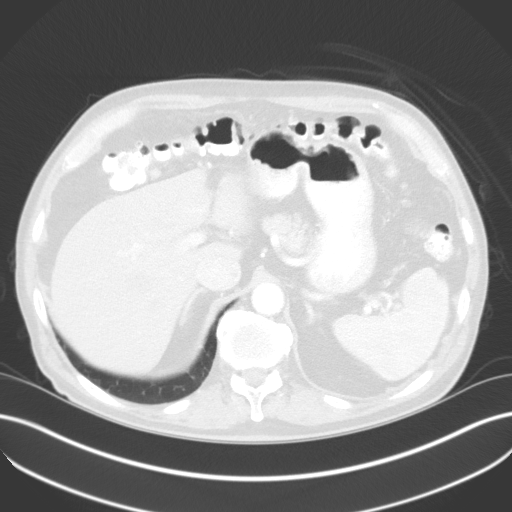
[im 89/128  lung]
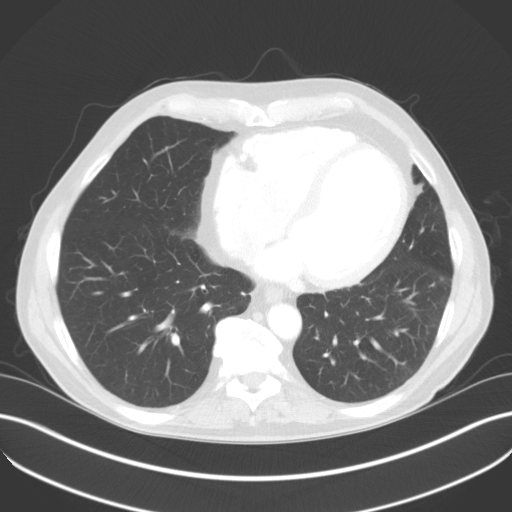
[im 102/128  mediastinal]
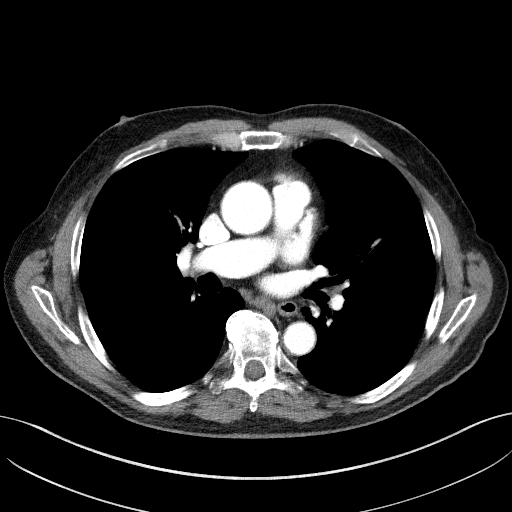
[im 102/128  lung]
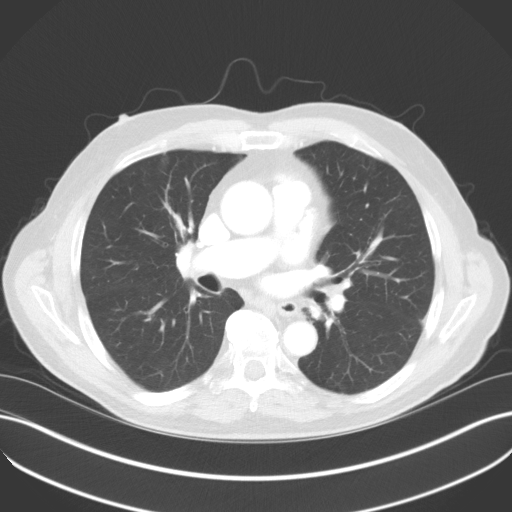
[im 115/128  lung]
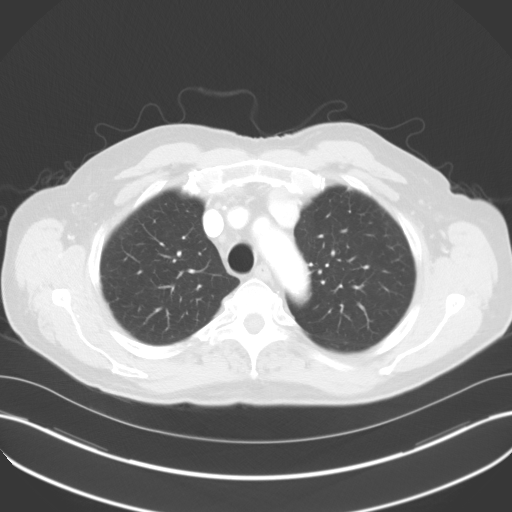

[Series 4: lung · axial · 0.78mm/px · z∈[-672,-554]mm · 4 of 153 slices shown]
[im 12/153  lung]
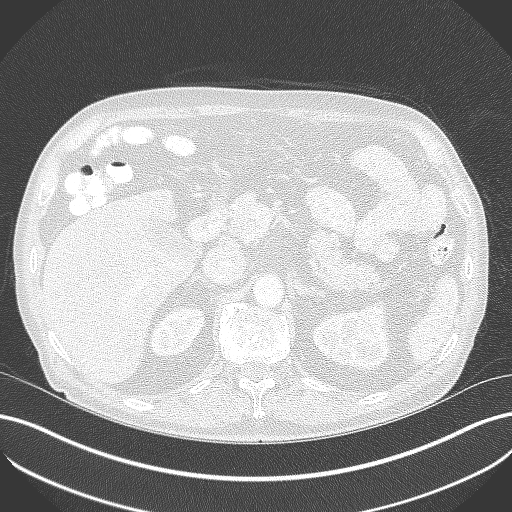
[im 36/153  lung]
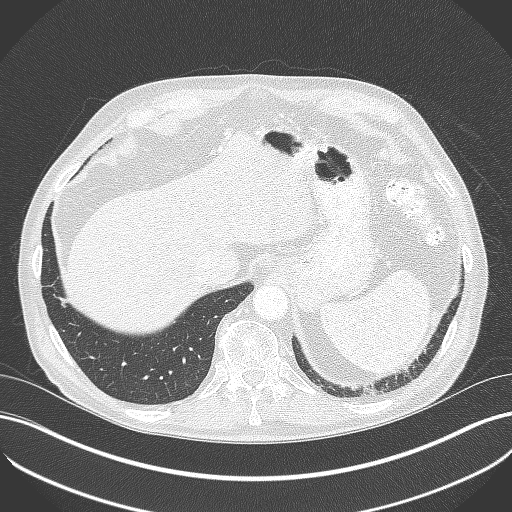
[im 59/153  lung]
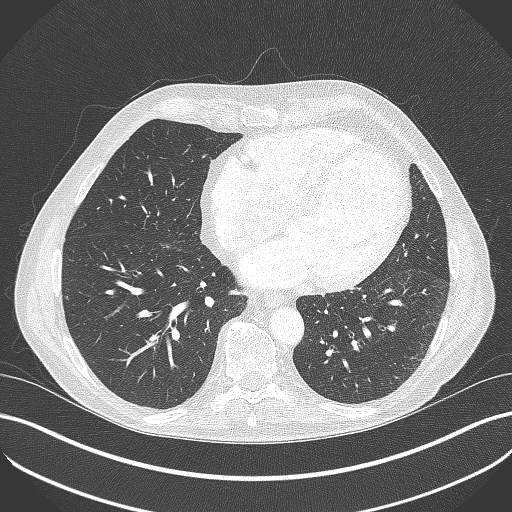
[im 71/153  lung]
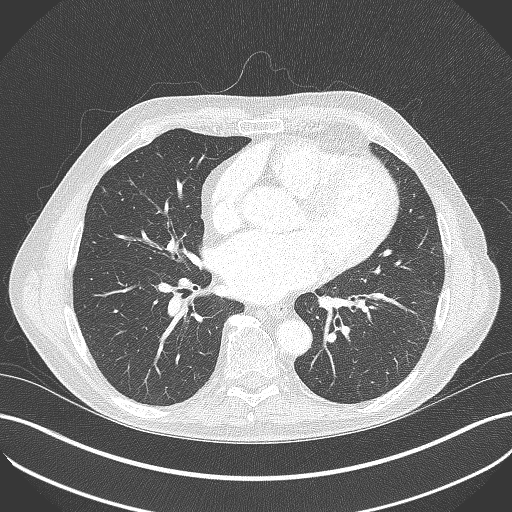

[14 of 31 positions shown; findings below may reference images not displayed]

FINDINGS: CT CHEST FINDINGS

Cardiovascular: There is no appreciable thoracic aortic aneurysm or
dissection. The visualized great vessels appear normal. There are
foci of atherosclerotic calcification in the aorta. There is
calcification in several coronary artery sites, most notably in the
left anterior descending coronary artery. Pericardium is not
appreciably thickened.

Mediastinum/Nodes: Thyroid appears normal. There is no appreciable
thoracic adenopathy. No esophageal lesions are evident.

Lungs/Pleura: There is no edema or consolidation. There are
scattered areas of mild lung scarring. There is no appreciable
pleural effusion or pleural thickening.

Musculoskeletal: There is degenerative change throughout the
thoracic spine. There is chronic appearing anterior wedging of the
T9 vertebral body. There are no blastic or lytic bone lesions.

CT ABDOMEN PELVIS FINDINGS

Hepatobiliary: There is a degree of hepatic steatosis. No focal
liver lesions are appreciable. There is cholelithiasis. Gallbladder
wall is not appreciably thickened. There is no biliary duct
dilatation.

Pancreas: There is no pancreatic mass or inflammatory focus.

Spleen: Spleen is normal in size and contour. No splenic lesions are
evident.

Adrenals/Urinary Tract: Adrenals appear normal bilaterally. Kidneys
bilaterally show no evident mass or hydronephrosis on either side.
There is no renal or ureteral calculus on either side. Urinary
bladder is midline with wall thickness within normal limits.

Stomach/Bowel: There are multiple diverticula in the descending
colon and sigmoid colon regions. No diverticulitis evident. There
appears to be a degree of localized wall thickening in the distal
gastric antrum and proximal duodenum. No other bowel wall thickening
evident. No mesenteric thickening. There is no evident bowel
obstruction. No free air or portal venous air. There is lipomatous
infiltration of the ileocecal valve.

Vascular/Lymphatic: There is atherosclerotic calcification in the
aorta and common iliac arteries. No evident aneurysm. There is
moderately severe calcification in the proximal left renal artery.
Major mesenteric vessels are patent. There is no evident adenopathy
in the abdomen or pelvis.

Reproductive: There are a few small prostatic calculi. Prostate and
seminal vesicles are normal in size and contour. No evident pelvic
mass.

Other: There is no appendiceal region inflammation. There is no
ascites or abscess in the abdomen or pelvis. There is evidence of
previous left inguinal hernia repair. There is mild fat in the right
inguinal ring.

Musculoskeletal: There is anterior wedging of the L1 and L2
vertebral bodies. There is slight anterolisthesis of L4 on L5. There
is no demonstrable blastic or lytic bone lesion. No intramuscular or
abdominal wall lesion evident.
IMPRESSION: CT chest:

1.  No edema or consolidation.  No parenchymal lung nodular lesion.

2.  No evident adenopathy.

3. Aortic atherosclerosis. Foci of coronary artery calcification,
most notably in the left anterior descending coronary artery.

4.  Age uncertain anterior wedging of the T9 vertebral body.

CT abdomen and pelvis:

1. Wall thickening in the distal gastric antrum and proximal
duodenum. Suspect a degree of antral gastritis and proximal
duodenitis. Given the clinical history, direct visualization of
these areas may be warranted. No other bowel wall thickening. No
bowel obstruction. There is sigmoid diverticulosis without
diverticulitis.

2.  Hepatic steatosis without focal liver lesion.

3.  Cholelithiasis.

4. Aortoiliac atherosclerosis. Moderately severe calcification in
the left renal artery proximally. In this regard, question whether
patient is hypertensive on a renovascular basis.

5. Evidence of previous left inguinal hernia repair. There is fat in
the right inguinal ring.

6. Age uncertain anterior wedging at L1 and L2. Mild
spondylolisthesis at L4-5.

## 2018-12-22 NOTE — Progress Notes (Signed)
Remote pacemaker transmission.   

## 2018-12-29 DIAGNOSIS — D225 Melanocytic nevi of trunk: Secondary | ICD-10-CM | POA: Diagnosis not present

## 2018-12-29 DIAGNOSIS — L570 Actinic keratosis: Secondary | ICD-10-CM | POA: Diagnosis not present

## 2018-12-29 DIAGNOSIS — L820 Inflamed seborrheic keratosis: Secondary | ICD-10-CM | POA: Diagnosis not present

## 2018-12-29 DIAGNOSIS — L538 Other specified erythematous conditions: Secondary | ICD-10-CM | POA: Diagnosis not present

## 2018-12-29 DIAGNOSIS — X32XXXA Exposure to sunlight, initial encounter: Secondary | ICD-10-CM | POA: Diagnosis not present

## 2018-12-29 DIAGNOSIS — Z85828 Personal history of other malignant neoplasm of skin: Secondary | ICD-10-CM | POA: Diagnosis not present

## 2018-12-29 DIAGNOSIS — D2262 Melanocytic nevi of left upper limb, including shoulder: Secondary | ICD-10-CM | POA: Diagnosis not present

## 2018-12-29 DIAGNOSIS — D2261 Melanocytic nevi of right upper limb, including shoulder: Secondary | ICD-10-CM | POA: Diagnosis not present

## 2019-01-14 DIAGNOSIS — M810 Age-related osteoporosis without current pathological fracture: Secondary | ICD-10-CM | POA: Diagnosis not present

## 2019-01-20 NOTE — Progress Notes (Signed)
[place]  [phone] [phone]  ID: Kern Alberta OB: 1934-06-04  MR#: [id number]  OIT#:[id number]  Patient Care Team: Rusty Aus, MD as PCP - General (Internal Medicine) Deboraha Sprang, MD as PCP - Cardiology (Cardiology)  CHIEF COMPLAINT: CLL, iron deficiency anemia.  INTERVAL HISTORY: Patient returns to clinic today for repeat laboratory work and further evaluation.  He currently feels well and is asymptomatic.  He does not complain of any weakness or fatigue.  He has no neurologic complaints.  He has no fevers or night sweats.  He has a good appetite and denies weight loss. He denies any chest pain, cough, hemoptysis, or shortness of breath.  He denies any nausea, vomiting, constipation, or diarrhea.  He has no melena or hematochezia.  He has no urinary complaints.  Patient feels at his baseline and offers no specific complaints today.  REVIEW OF SYSTEMS:   Review of Systems  Constitutional: Negative.  Negative for fever, malaise/fatigue and weight loss.  Respiratory: Negative.  Negative for cough and shortness of breath.   Cardiovascular: Negative.  Negative for chest pain and leg swelling.  Gastrointestinal: Negative.  Negative for abdominal pain, blood in stool, constipation and melena.  Genitourinary: Negative.  Negative for dysuria and hematuria.  Musculoskeletal: Negative.  Negative for back pain.  Skin: Negative.  Negative for rash.  Neurological: Negative.  Negative for sensory change, focal weakness and weakness.  Endo/Heme/Allergies: Does not bruise/bleed easily.  Psychiatric/Behavioral: Negative.  The patient is not nervous/anxious.     As per HPI. Otherwise, a complete review of systems is negative.  PAST MEDICAL HISTORY: Past Medical History:  Diagnosis Date  . Arthritis    "joints; fingers, knees" (03/06/2017)  . CAD (coronary artery disease)    a. 01/2016 Inf STEMI->PCI/Thrombectomy/DES to mid RCA (4.0 x 30  Resolute DES), EF 25-30%; b. 01/2016 Staged PCI/DES to mLAD (3.0x22 Resolute DES); c. 10/2016 Cath: LM nl, LAD patent stent, 30d, D2 70, LCX nl, OM1/2 nl, RCA patent mid/dist stent.  . CLL (chronic lymphocytic leukemia) ([place])    "never taken any RX; just monitor this twice/year" (03/06/2017)  . Diverticulitis   . GERD (gastroesophageal reflux disease)   . H/O Bell's palsy 1960s  . HFrEF (heart failure with reduced ejection fraction) ([place])   . High cholesterol   . History of kidney stones   . HOH (hard of hearing)   . Iron deficiency anemia   . Ischemic cardiomyopathy    a. 01/2016 EF 25-30%; b. 04/2016 f/u echo: EF 36%, mild glob HK, basal and mid inflat, inf, infsept, mid apical inf AK, mildly dil LA/RA, mild AI/MR, trace TR; c. 06/2016 Cardiac MRI: EF 36%, apical AK, mod diff HK, septal-lateral dyssynchrony  . LBBB (left bundle branch block)   . Mobitz type 2 second degree AV block 03/06/2017  . Myocardial infarction ([place]) 01/27/2016  . Presence of permanent cardiac pacemaker   . Skin cancer    a. on head years ago  . Tachy-brady syndrome ([place])    a. 02/2017 s/p SJM 3262 Quadra Allur MP CRT-P.    PAST SURGICAL HISTORY: Past Surgical History:  Procedure Laterality Date  . BACK SURGERY    . BI-VENTRICULAR PACEMAKER INSERTION (CRT-P)  03/06/2017  . BIV PACEMAKER INSERTION CRT-P N/A 03/06/2017   Procedure: BIV PACEMAKER INSERTION CRT-P;  Surgeon: Evans Lance, MD;  Location: [facility];  Service: Cardiovascular;  Laterality: N/A;  . CARDIAC CATHETERIZATION N/A 07/29/2014   Procedure:  Left Heart Cath;  Surgeon: Teodoro Spray, MD;  Location: [facility];  Service: Cardiovascular;  Laterality: N/A;  . CARDIAC CATHETERIZATION N/A 01/27/2016   Procedure: Left Heart Cath and Coronary Angiography;  Surgeon: [name], MD;  Location: [place] [facility];  Service: Cardiovascular;  Laterality: N/A;  . CARDIAC CATHETERIZATION N/A 01/27/2016   Procedure: Coronary Stent Intervention;   Surgeon: [name], MD;  Location: [facility];  Service: Cardiovascular;  Laterality: N/A;  . CARDIAC CATHETERIZATION N/A 02/13/2016   Procedure: Coronary Stent Intervention;  Surgeon: [name], MD;  Location: [facility];  Service: Cardiovascular;  Laterality: N/A;  . CARDIAC CATHETERIZATION N/A 02/13/2016   Procedure: Left Heart Cath and Coronary Angiography;  Surgeon: [name], MD;  Location: [facility];  Service: Cardiovascular;  Laterality: N/A;  . CARDIAC CATHETERIZATION    . CATARACT EXTRACTION W/PHACO Right 07/18/2014   Procedure: CATARACT EXTRACTION PHACO AND INTRAOCULAR LENS PLACEMENT (IOC);  Surgeon: Estill Cotta, MD;  Location: [facility];  Service: Ophthalmology;  Laterality: Right;  [place] 03:11 AP% 28.4 CDE 75.64  . CATARACT EXTRACTION W/PHACO Left 06/19/2015   Procedure: CATARACT EXTRACTION PHACO AND INTRAOCULAR LENS PLACEMENT (IOC);  Surgeon: Estill Cotta, MD;  Location: [facility];  Service: Ophthalmology;  Laterality: Left;  [place] 02:36.5 AP% 27.7 CDE 69.51 fluid pack lot # [id number]  . COLONOSCOPY WITH PROPOFOL N/A 04/20/2018   Procedure: COLONOSCOPY WITH PROPOFOL;  Surgeon: Manya Silvas, MD;  Location: [facility]OSCOPY;  Service: Endoscopy;  Laterality: N/A;  . ESOPHAGOGASTRODUODENOSCOPY (EGD) WITH PROPOFOL N/A 04/20/2018   Procedure: ESOPHAGOGASTRODUODENOSCOPY (EGD) WITH PROPOFOL;  Surgeon: Manya Silvas, MD;  Location[facility] END[facility]COPY;  Service: Endoscopy;  Laterality: N/A;  . EYE SURGERY    . INGUINAL HERNIA REPAIR Left 2010  . INSERT / REPLACE / REMOVE PACEMAKER    . LEFT HEART CATH AND CORONARY ANGIOGRAPHY N/A 11/18/2016   Procedure: LEFT HEART CATH AND CORONARY ANGIOGRAPHY;  Surgeon: Wellington Hampshire, MD;  Location: [place] [facility];  Service: Cardiovascular;  Laterality: N/A;  . LUMBAR [place]   "ruptured disc; took a piece off my buttocks and fixed it"  . SKIN CANCER EXCISION     "top of my head"  . TONSILLECTOMY       FAMILY HISTORY: Reviewed and unchanged. No reported history of malignancy or chronic disease.     ADVANCED DIRECTIVES:    HEALTH MAINTENANCE: Social History   Tobacco Use  . Smoking status: Former Smoker    Years: 4.00    Types: Pipe, Cigars    Quit date: 1960    Years since quitting: 60.9  . Smokeless tobacco: Former Systems developer    Types: Chew    Quit date: 1960  Substance Use Topics  . Alcohol use: No  . Drug use: Never     Colonoscopy:  PAP:  Bone density:  Lipid panel:  Allergies  Allergen Reactions  . Niaspan [Niacin Er] Anaphylaxis and Other (See Comments)    Episode happened 10 to 15 years ago.  Has not tried any other similar meds since  . Adhesive [Tape] Other (See Comments)    Skin is very sensitive; PLEASE USE PAPER TAPE!!    Current Outpatient Medications  Medication Sig Dispense Refill  . alendronate (FOSAMAX) 70 MG tablet Take 1 tablet by mouth once a week.     Marland Kitchen aspirin 81 MG tablet Take 81 mg by mouth daily.     Marland Kitchen atorvastatin (  LIPITOR) 80 MG tablet Take 1 tablet (80 mg total) by mouth every evening. 90 tablet 3  . carboxymethylcellulose (REFRESH TEARS) 0.5 % SOLN Place 1 drop into both eyes 3 (three) times daily as needed (for dry eyes).     . carvedilol (COREG) 3.125 MG tablet TAKE 1 TABLET BY MOUTH TWICE DAILY 180 tablet 0  . co-enzyme Q-10 30 MG capsule Take 30 mg by mouth daily.    . Cyanocobalamin (B-12) 500 MCG SUBL Place 500 mcg under the tongue once a week.     . ferrous gluconate (FERGON) 324 MG tablet Take 1 tablet by mouth 2 (two) times daily.    Marland Kitchen lisinopril (PRINIVIL,ZESTRIL) 5 MG tablet Take 2.5 mg by mouth daily.    . pantoprazole (PROTONIX) 40 MG tablet Take 40 mg by mouth 2 (two) times daily.     Marland Kitchen spironolactone (ALDACTONE) 25 MG tablet TAKE 1/2 TABLET BY MOUTH DAILY 45 tablet 1  . ticagrelor (BRILINTA) 90 MG TABS tablet Take 1 tablet (90 mg total) by mouth 2 (two) times daily. 180 tablet 3  . nitroGLYCERIN (NITROSTAT) 0.4 MG SL tablet  Place 1 tablet (0.4 mg total) under the tongue every 5 (five) minutes x 3 doses as needed for chest pain. (Patient not taking: Reported on 09/17/2018) 25 tablet 3  . sucralfate (CARAFATE) 1 GM/10ML suspension Take 1 g by mouth 4 (four) times daily.     No current facility-administered medications for this visit.     OBJECTIVE: Vitals:   01/29/19 1321  BP: 124/68  Pulse: 75  Resp: 16  Temp: (!) 96.9 F (36.1 C)  SpO2: 100%     Body mass index is 27.22 kg/m.    ECOG FS:0 - Asymptomatic  General: Well-developed, well-nourished, no acute distress. Eyes: Pink conjunctiva, anicteric sclera. HEENT: Normocephalic, moist mucous membranes. Lungs: Clear to auscultation bilaterally. Heart: Regular rate and rhythm. No rubs, murmurs, or gallops. Abdomen: Soft, nontender, nondistended. No organomegaly noted, normoactive bowel sounds. Musculoskeletal: No edema, cyanosis, or clubbing. Neuro: Alert, answering all questions appropriately. Cranial nerves grossly intact. Skin: No rashes or petechiae noted. Psych: Normal affect.  LAB RESULTS:  Lab Results  Component Value Date   NA 135 12/11/2017   K 4.3 12/11/2017   CL 103 12/11/2017   CO2 26 12/11/2017   GLUCOSE 84 12/11/2017   BUN 13 12/11/2017   CREATININE 1.00 05/13/2018   CALCIUM 9.0 12/11/2017   PROT 5.6 (L) 01/27/2016   ALBUMIN 3.4 (L) 01/27/2016   AST 21 01/27/2016   ALT 17 01/27/2016   ALKPHOS 53 01/27/2016   BILITOT 0.7 01/27/2016   GFRNONAA >60 12/11/2017   GFRAA >60 12/11/2017    Lab Results  Component Value Date   WBC 8.8 01/29/2019   NEUTROABS 2.9 01/29/2019   HGB 11.6 (L) 01/29/2019   HCT 36.0 (L) 01/29/2019   MCV 102.0 (H) 01/29/2019   PLT 203 01/29/2019   Lab Results  Component Value Date   IRON 113 01/29/2019   TIBC 285 01/29/2019   IRONPCTSAT 40 (H) 01/29/2019   Lab Results  Component Value Date   FERRITIN 22 (L) 01/29/2019     STUDIES: No results found.  ASSESSMENT: CLL, Rai stage 0.  Iron  deficiency anemia.  PLAN:    1.  CLL: White blood cell count continues to be within normal limits.  Repeat flow cytometry is pending at time of dictation.  CT in August 2014 was reviewed independently and did not reveal any underlying lymphadenopathy.  This does not need to be repeated unless there is concern of progression of disease.  No intervention is needed at this time.  Patient does not require bone marrow biopsy.   2.  Iron deficiency anemia: Patient's hemoglobin has improved and is nearly within normal limits.  His iron stores have trended down slightly, but he remains asymptomatic. Colonoscopy and EGD on April 20, 2018 did not reveal any distinct pathology or evidence of bleeding.  He does not require additional IV Feraheme at this time, but may require repeat treatment at his next clinic visit.  Return to clinic in 6 months for repeat laboratory and further evaluation.   Patient expressed understanding and was in agreement with this plan. He also understands that He can call clinic at any time with any questions, concerns, or complaints.    Lloyd Huger, MD   01/30/2019 8:39 AM

## 2019-01-28 ENCOUNTER — Encounter: Payer: Self-pay | Admitting: Oncology

## 2019-01-28 NOTE — Progress Notes (Signed)
Patient prescreened for appointment. Patient has no concerns or questions.  

## 2019-01-29 ENCOUNTER — Inpatient Hospital Stay (HOSPITAL_BASED_OUTPATIENT_CLINIC_OR_DEPARTMENT_OTHER): Payer: PPO | Admitting: Oncology

## 2019-01-29 ENCOUNTER — Inpatient Hospital Stay: Payer: PPO

## 2019-01-29 ENCOUNTER — Inpatient Hospital Stay: Payer: PPO | Attending: Oncology

## 2019-01-29 ENCOUNTER — Other Ambulatory Visit: Payer: Self-pay

## 2019-01-29 VITALS — BP 124/68 | HR 75 | Temp 96.9°F | Resp 16 | Wt 184.3 lb

## 2019-01-29 DIAGNOSIS — C9110 Chronic lymphocytic leukemia of B-cell type not having achieved remission: Secondary | ICD-10-CM

## 2019-01-29 DIAGNOSIS — Z87891 Personal history of nicotine dependence: Secondary | ICD-10-CM | POA: Insufficient documentation

## 2019-01-29 DIAGNOSIS — D509 Iron deficiency anemia, unspecified: Secondary | ICD-10-CM | POA: Diagnosis not present

## 2019-01-29 LAB — FERRITIN: Ferritin: 22 ng/mL — ABNORMAL LOW (ref 24–336)

## 2019-01-29 LAB — CBC WITH DIFFERENTIAL/PLATELET
Abs Immature Granulocytes: 0.01 10*3/uL (ref 0.00–0.07)
Basophils Absolute: 0 10*3/uL (ref 0.0–0.1)
Basophils Relative: 1 %
Eosinophils Absolute: 0.2 10*3/uL (ref 0.0–0.5)
Eosinophils Relative: 2 %
HCT: 36 % — ABNORMAL LOW (ref 39.0–52.0)
Hemoglobin: 11.6 g/dL — ABNORMAL LOW (ref 13.0–17.0)
Immature Granulocytes: 0 %
Lymphocytes Relative: 55 %
Lymphs Abs: 4.9 10*3/uL — ABNORMAL HIGH (ref 0.7–4.0)
MCH: 32.9 pg (ref 26.0–34.0)
MCHC: 32.2 g/dL (ref 30.0–36.0)
MCV: 102 fL — ABNORMAL HIGH (ref 80.0–100.0)
Monocytes Absolute: 0.8 10*3/uL (ref 0.1–1.0)
Monocytes Relative: 9 %
Neutro Abs: 2.9 10*3/uL (ref 1.7–7.7)
Neutrophils Relative %: 33 %
Platelets: 203 10*3/uL (ref 150–400)
RBC: 3.53 MIL/uL — ABNORMAL LOW (ref 4.22–5.81)
RDW: 13.8 % (ref 11.5–15.5)
WBC: 8.8 10*3/uL (ref 4.0–10.5)
nRBC: 0 % (ref 0.0–0.2)

## 2019-01-29 LAB — IRON AND TIBC
Iron: 113 ug/dL (ref 45–182)
Saturation Ratios: 40 % — ABNORMAL HIGH (ref 17.9–39.5)
TIBC: 285 ug/dL (ref 250–450)
UIBC: 172 ug/dL

## 2019-01-29 NOTE — Progress Notes (Signed)
Patient is here today for a follow-up with no complaints.  

## 2019-02-03 LAB — COMP PANEL: LEUKEMIA/LYMPHOMA: Immunophenotypic Profile: 40

## 2019-02-23 DIAGNOSIS — I420 Dilated cardiomyopathy: Secondary | ICD-10-CM | POA: Diagnosis not present

## 2019-02-23 DIAGNOSIS — D500 Iron deficiency anemia secondary to blood loss (chronic): Secondary | ICD-10-CM | POA: Diagnosis not present

## 2019-02-23 DIAGNOSIS — E538 Deficiency of other specified B group vitamins: Secondary | ICD-10-CM | POA: Diagnosis not present

## 2019-02-23 DIAGNOSIS — E782 Mixed hyperlipidemia: Secondary | ICD-10-CM | POA: Diagnosis not present

## 2019-02-23 DIAGNOSIS — Z125 Encounter for screening for malignant neoplasm of prostate: Secondary | ICD-10-CM | POA: Diagnosis not present

## 2019-03-02 ENCOUNTER — Other Ambulatory Visit: Payer: Self-pay

## 2019-03-02 DIAGNOSIS — I495 Sick sinus syndrome: Secondary | ICD-10-CM | POA: Diagnosis not present

## 2019-03-02 DIAGNOSIS — D500 Iron deficiency anemia secondary to blood loss (chronic): Secondary | ICD-10-CM | POA: Diagnosis not present

## 2019-03-02 DIAGNOSIS — I700 Atherosclerosis of aorta: Secondary | ICD-10-CM | POA: Diagnosis not present

## 2019-03-02 DIAGNOSIS — C9110 Chronic lymphocytic leukemia of B-cell type not having achieved remission: Secondary | ICD-10-CM | POA: Diagnosis not present

## 2019-03-02 DIAGNOSIS — M818 Other osteoporosis without current pathological fracture: Secondary | ICD-10-CM | POA: Insufficient documentation

## 2019-03-02 DIAGNOSIS — E538 Deficiency of other specified B group vitamins: Secondary | ICD-10-CM | POA: Diagnosis not present

## 2019-03-02 DIAGNOSIS — E782 Mixed hyperlipidemia: Secondary | ICD-10-CM | POA: Diagnosis not present

## 2019-03-02 DIAGNOSIS — I420 Dilated cardiomyopathy: Secondary | ICD-10-CM | POA: Diagnosis not present

## 2019-03-02 DIAGNOSIS — Z0000 Encounter for general adult medical examination without abnormal findings: Secondary | ICD-10-CM | POA: Diagnosis not present

## 2019-03-02 NOTE — Telephone Encounter (Signed)
*  STAT* If patient is at the pharmacy, call can be transferred to refill team.   1. Which medications need to be refilled? (please list name of each medication and dose if known) Spironolactone  2. Which pharmacy/location (including street and city if local pharmacy) is medication to be sent to? [facility]  3. Do they need a 30 day or 90 day supply? [place]

## 2019-03-03 MED ORDER — SPIRONOLACTONE 25 MG PO TABS: 12.5000 mg | ORAL_TABLET | Freq: Every day | ORAL | 0 refills | Status: DC

## 2019-03-10 ENCOUNTER — Ambulatory Visit (INDEPENDENT_AMBULATORY_CARE_PROVIDER_SITE_OTHER): Payer: PPO | Admitting: *Deleted

## 2019-03-10 DIAGNOSIS — I441 Atrioventricular block, second degree: Secondary | ICD-10-CM

## 2019-03-10 LAB — CUP PACEART REMOTE DEVICE CHECK
Battery Remaining Longevity: 68 mo
Battery Remaining Percentage: 95.5 %
Battery Voltage: 2.96 V
Brady Statistic AP VP Percent: 85 %
Brady Statistic AP VS Percent: 1 %
Brady Statistic AS VP Percent: 13 %
Brady Statistic AS VS Percent: 1.1 %
Brady Statistic RA Percent Paced: 85 %
Date Time Interrogation Session: 20210112020013
Implantable Lead Implant Date: 20190110
Implantable Lead Implant Date: 20190110
Implantable Lead Implant Date: 20190110
Implantable Lead Location: 753858
Implantable Lead Location: 753859
Implantable Lead Location: 753860
Implantable Pulse Generator Implant Date: 20190110
Lead Channel Impedance Value: 380 Ohm
Lead Channel Impedance Value: 580 Ohm
Lead Channel Impedance Value: 760 Ohm
Lead Channel Pacing Threshold Amplitude: 0.5 V
Lead Channel Pacing Threshold Amplitude: 0.5 V
Lead Channel Pacing Threshold Amplitude: 1.5 V
Lead Channel Pacing Threshold Pulse Width: 0.5 ms
Lead Channel Pacing Threshold Pulse Width: 0.5 ms
Lead Channel Pacing Threshold Pulse Width: 1.5 ms
Lead Channel Sensing Intrinsic Amplitude: 12 mV
Lead Channel Sensing Intrinsic Amplitude: 5 mV
Lead Channel Setting Pacing Amplitude: 2 V
Lead Channel Setting Pacing Amplitude: 2.5 V
Lead Channel Setting Pacing Amplitude: 2.5 V
Lead Channel Setting Pacing Pulse Width: 0.5 ms
Lead Channel Setting Pacing Pulse Width: 1.5 ms
Lead Channel Setting Sensing Sensitivity: 2 mV
Pulse Gen Model: 3262
Pulse Gen Serial Number: 8942222

## 2019-03-16 ENCOUNTER — Encounter: Payer: Self-pay | Admitting: Internal Medicine

## 2019-03-16 ENCOUNTER — Other Ambulatory Visit: Payer: Self-pay

## 2019-03-16 ENCOUNTER — Ambulatory Visit: Payer: PPO | Admitting: Internal Medicine

## 2019-03-16 VITALS — BP 112/76 | HR 79 | Ht 70.0 in | Wt 188.8 lb

## 2019-03-16 DIAGNOSIS — I442 Atrioventricular block, complete: Secondary | ICD-10-CM | POA: Diagnosis not present

## 2019-03-16 DIAGNOSIS — R001 Bradycardia, unspecified: Secondary | ICD-10-CM

## 2019-03-16 DIAGNOSIS — I447 Left bundle-branch block, unspecified: Secondary | ICD-10-CM | POA: Diagnosis not present

## 2019-03-16 DIAGNOSIS — Z950 Presence of cardiac pacemaker: Secondary | ICD-10-CM

## 2019-03-16 DIAGNOSIS — I255 Ischemic cardiomyopathy: Secondary | ICD-10-CM | POA: Diagnosis not present

## 2019-03-16 DIAGNOSIS — I5022 Chronic systolic (congestive) heart failure: Secondary | ICD-10-CM

## 2019-03-16 NOTE — Progress Notes (Signed)
Patient Care Team: Rusty Aus, MD as PCP - General (Internal Medicine) Deboraha Sprang, MD as PCP - Cardiology (Cardiology)   HPI  Christopher Daniels is a 85 y.o. male seen in followup of sinus bradycardia; left ventricular dysfunction in the setting of a recent MI. He also has LBBB;  he is status post CRT-P implantation (GT) 2019 when he presented with higher grade heart block.  He has a history of ischemic heart disease.  12/17 STEMI     DATE TEST EF   12/17 LHC 25-30% RCA  Stent 100% >> 0  LAD Stent 80% >>0  5/18 cMRI   36 % + LGE apical septum--normal LV thickness  5/18 Echo   30-35 % LVH 1.3 cm  9/18 LHC  Patent Stents       Date Cr K Hgb  4/18  0.79 4.5    6/18  1.0 4.5 13.7  1/19 0.9 3.8   10/19 1.04 4.3 10.9 (11.2-12/19)  12/20 1.1 4.6 12.3   The patient denies chest pain, shortness of breath, nocturnal dyspnea, orthopnea or peripheral edema.  There have been no palpitations, lightheadedness or syncope.  He has been diagnosed with iron deficiency anemia.  Dr. Gary Fleet notes were reviewed.  3/20 saturation was 6%.  Now normal.        Past Medical History:  Diagnosis Date  . Arthritis    "joints; fingers, knees" (03/06/2017)  . CAD (coronary artery disease)    a. 01/2016 Inf STEMI->PCI/Thrombectomy/DES to mid RCA (4.0 x 30 Resolute DES), EF 25-30%; b. 01/2016 Staged PCI/DES to mLAD (3.0x22 Resolute DES); c. 10/2016 Cath: LM nl, LAD patent stent, 30d, D2 70, LCX nl, OM1/2 nl, RCA patent mid/dist stent.  . CLL (chronic lymphocytic leukemia) ([place])    "never taken any RX; just monitor this twice/year" (03/06/2017)  . Diverticulitis   . GERD (gastroesophageal reflux disease)   . H/O Bell's palsy 1960s  . HFrEF (heart failure with reduced ejection fraction) ([place])   . High cholesterol   . History of kidney stones   . HOH (hard of hearing)   . Iron deficiency anemia   . Ischemic cardiomyopathy    a. 01/2016 EF 25-30%; b. 04/2016 f/u echo: EF 36%, mild glob  HK, basal and mid inflat, inf, infsept, mid apical inf AK, mildly dil LA/RA, mild AI/MR, trace TR; c. 06/2016 Cardiac MRI: EF 36%, apical AK, mod diff HK, septal-lateral dyssynchrony  . LBBB (left bundle branch block)   . Mobitz type 2 second degree AV block 03/06/2017  . Myocardial infarction ([place]) 01/27/2016  . Presence of permanent cardiac pacemaker   . Skin cancer    a. on head years ago  . Tachy-brady syndrome ([place])    a. 02/2017 s/p SJM 3262 Quadra Allur MP CRT-P.    Past Surgical History:  Procedure Laterality Date  . BACK SURGERY    . BI-VENTRICULAR PACEMAKER INSERTION (CRT-P)  03/06/2017  . BIV PACEMAKER INSERTION CRT-P N/A 03/06/2017   Procedure: BIV PACEMAKER INSERTION CRT-P;  Surgeon: Evans Lance, MD;  Location: [place] [facility];  Service: Cardiovascular;  Laterality: N/A;  . CARDIAC CATHETERIZATION N/A 07/29/2014   Procedure: Left Heart Cath;  Surgeon: Teodoro Spray, MD;  Location: [place] [facility];  Service: Cardiovascular;  Laterality: N/A;  . CARDIAC CATHETERIZATION N/A 01/27/2016   Procedure: Left Heart Cath and Coronary Angiography;  Surgeon: [name], MD;  Location: [place] [facility];  Service: Cardiovascular;  Laterality: N/A;  . CARDIAC CATHETERIZATION N/A 01/27/2016   Procedure: Coronary Stent Intervention;  Surgeon: [name], MD;  Location: [place] [facility];  Service: Cardiovascular;  Laterality: N/A;  . CARDIAC CATHETERIZATION N/A 02/13/2016   Procedure: Coronary Stent Intervention;  Surgeon: [name], MD;  Location: [place] [facility];  Service: Cardiovascular;  Laterality: N/A;  . CARDIAC CATHETERIZATION N/A 02/13/2016   Procedure: Left Heart Cath and Coronary Angiography;  Surgeon: [name], MD;  Location: [place] [facility];  Service: Cardiovascular;  Laterality: N/A;  . CARDIAC CATHETERIZATION    . CATARACT EXTRACTION W/PHACO Right 07/18/2014   Procedure: CATARACT EXTRACTION PHACO AND INTRAOCULAR LENS PLACEMENT (IOC);  Surgeon: Estill Cotta,  MD;  Location: [facility];  Service: Ophthalmology;  Laterality: Right;  [place] 03:11 AP% 28.4 CDE 75.64  . CATARACT EXTRACTION W/PHACO Left 06/19/2015   Procedure: CATARACT EXTRACTION PHACO AND INTRAOCULAR LENS PLACEMENT (IOC);  Surgeon: Estill Cotta, MD;  Location: [facility];  Service: Ophthalmology;  Laterality: Left;  [place] 02:36.5 AP% 27.7 CDE 69.51 fluid pack lot # [id number]  . COLONOSCOPY WITH PROPOFOL N/A 04/20/2018   Procedure: COLONOSCOPY WITH PROPOFOL;  Surgeon: Manya Silvas, MD;  Location: [facility];  Service: Endoscopy;  Laterality: N/A;  . ESOPHAGOGASTRODUODENOSCOPY (EGD) WITH PROPOFOL N/A 04/20/2018   Procedure: ESOPHAGOGASTRODUODENOSCOPY (EGD) WITH PROPOFOL;  Surgeon: Manya Silvas, MD;  Location: [facility] ENDOSCOPY;  Service: Endoscopy;  Laterality: N/A;  . EYE SURGERY    . INGUINAL HERNIA REPAIR Left 2010  . INSERT / REPLACE / REMOVE PACEMAKER    . LEFT HEART CATH AND CORONARY ANGIOGRAPHY N/A 11/18/2016   Procedure: LEFT HEART CATH AND CORONARY ANGIOGRAPHY;  Surgeon: Wellington Hampshire, MD;  Location: [place] [facility];  Service: Cardiovascular;  Laterality: N/A;  . LUMBAR [place]   "ruptured disc; took a piece off my buttocks and fixed it"  . SKIN CANCER EXCISION     "top of my head"  . TONSILLECTOMY      Current Outpatient Medications  Medication Sig Dispense Refill  . aspirin 81 MG tablet Take 81 mg by mouth daily.     Marland Kitchen atorvastatin (LIPITOR) 80 MG tablet Take 1 tablet (80 mg total) by mouth every evening. 90 tablet 3  . carboxymethylcellulose (REFRESH TEARS) 0.5 % SOLN Place 1 drop into both eyes 3 (three) times daily as needed (for dry eyes).     . carvedilol (COREG) 3.125 MG tablet TAKE 1 TABLET BY MOUTH TWICE DAILY 180 tablet 0  . co-enzyme Q-10 30 MG capsule Take 30 mg by mouth daily.    . Cyanocobalamin (B-12) 500 MCG SUBL Place 500 mcg under the tongue once a week.     . ferrous gluconate (FERGON) 324 MG tablet Take 1 tablet by mouth 2 (two)  times daily.    Marland Kitchen lisinopril (PRINIVIL,ZESTRIL) 5 MG tablet Take 2.5 mg by mouth daily.    . nitroGLYCERIN (NITROSTAT) 0.4 MG SL tablet Place 1 tablet (0.4 mg total) under the tongue every 5 (five) minutes x 3 doses as needed for chest pain. 25 tablet 3  . pantoprazole (PROTONIX) 40 MG tablet Take 40 mg by mouth 2 (two) times daily.     Marland Kitchen spironolactone (ALDACTONE) 25 MG tablet Take 0.5 tablets (12.5 mg total) by mouth daily. 45 tablet 0  . ticagrelor (BRILINTA) 90 MG TABS tablet Take 1 tablet (90 mg total) by mouth 2 (two) times daily. 180 tablet 3   No current facility-administered medications for  this visit.    Allergies  Allergen Reactions  . Niaspan [Niacin Er] Anaphylaxis and Other (See Comments)    Episode happened 10 to 15 years ago.  Has not tried any other similar meds since  . Adhesive [Tape] Other (See Comments)    Skin is very sensitive; PLEASE USE PAPER TAPE!!         Social History  . Marital status: Married    Spouse name: N/A  . Number of children: N/A  . Years of education: N/A   Occupational History  . Not on file.   Social History Main Topics  . Smoking status: Former Smoker    Years: 4.00    Types: Cigars  . Smokeless tobacco: Never Used  . Alcohol use No  . Drug use: No  . Sexual activity: Not on file   Other Topics Concern  . Not on file     Family History  Problem Relation Age of Onset  . Heart failure Father      ROS:  Please see the history of present illness.     All other systems reviewed and negative.    BP 112/76 (BP Location: Right Arm, Patient Position: Sitting, Cuff Size: Normal)   Pulse 79   Ht 5\' 10"  (1.778 m)   Wt 188 lb 12 oz (85.6 kg)   BMI 27.08 kg/m  Well developed and well nourished in no acute distress HENT normal Neck supple with JVP-flat Clear Device pocket well healed; without hematoma or erythema.  There is no tethering  Regular rate and rhythm, no murmur Abd-soft with active BS No Clubbing cyanosis  edema Skin-warm and dry A & Oriented  Grossly normal sensory and motor function  ECG AV pacing with low voltage    Assessment and Plan:  Sinus bradycardia    High-grade heart block intermittent  Ischemic cardiomyopathy  status post DES 2   12/17  EF 36% MRI 5/18   Congestive heart failure class II  Left bundle branch block  CRT-pacemaker St Jude The patient's device was interrogated and the information was fully reviewed.  The device was reprogrammed to increase the detection rate for his atrial arrhythmias as he has an atrial tachycardia that is triggering mode switch  Hyperlipidemia  Orthostatic hypotension  Low voltage     He has iron deficiency anemia possibly also other anemia as hgb remains depressed in the setting of normal saturation and ferritin.  In the context of his orthostasis and his conduction system disease it raises the possibility of amyloid.  His MRI 2018 was not suggestive.  Will defer to Dr. Sabra Heck.      Current medicines are reviewed at length with the patient today .  The patient does not   have concerns regarding medicines.

## 2019-03-16 NOTE — Patient Instructions (Signed)
Medication Instructions:  - Your physician recommends that you continue on your current medications as directed. Please refer to the Current Medication list given to you today.  *If you need a refill on your cardiac medications before your next appointment, please call your pharmacy*  Lab Work: - none ordered  If you have labs (blood work) drawn today and your tests are completely normal, you will receive your results only by: . MyChart Message (if you have MyChart) OR . A paper copy in the mail If you have any lab test that is abnormal or we need to change your treatment, we will call you to review the results.  Testing/Procedures: - none ordered  Follow-Up: At [facility], you and your health needs are our priority.  As part of our continuing mission to provide you with exceptional heart care, we have created designated Provider Care Teams.  These Care Teams include your primary Cardiologist (physician) and Advanced Practice Providers (APPs -  Physician Assistants and Nurse Practitioners) who all work together to provide you with the care you need, when you need it.  Your next appointment:   1 year(s)  The format for your next appointment:   In Person  Provider:   Steven Klein, MD  Other Instructions - n/a  

## 2019-04-05 DIAGNOSIS — Z961 Presence of intraocular lens: Secondary | ICD-10-CM | POA: Diagnosis not present

## 2019-04-21 ENCOUNTER — Telehealth: Payer: Self-pay | Admitting: Internal Medicine

## 2019-04-21 DIAGNOSIS — K449 Diaphragmatic hernia without obstruction or gangrene: Secondary | ICD-10-CM | POA: Diagnosis not present

## 2019-04-21 DIAGNOSIS — K5730 Diverticulosis of large intestine without perforation or abscess without bleeding: Secondary | ICD-10-CM | POA: Diagnosis not present

## 2019-04-21 DIAGNOSIS — K219 Gastro-esophageal reflux disease without esophagitis: Secondary | ICD-10-CM | POA: Diagnosis not present

## 2019-04-21 DIAGNOSIS — C9110 Chronic lymphocytic leukemia of B-cell type not having achieved remission: Secondary | ICD-10-CM | POA: Diagnosis not present

## 2019-04-21 DIAGNOSIS — D500 Iron deficiency anemia secondary to blood loss (chronic): Secondary | ICD-10-CM | POA: Diagnosis not present

## 2019-04-21 NOTE — Telephone Encounter (Signed)
Pt c/o of Chest Pain: STAT if CP now or developed within 24 hours  1. Are you having CP right now? No   2. Are you experiencing any other symptoms (ex. SOB, nausea, vomiting, sweating)? Fatigue   3. How long have you been experiencing CP? 1 episode Saturday mild chest tightness relieved with nitro but fatigue lingers  4. Is your CP continuous or coming and going? 1 episode   5. Have you taken Nitroglycerin? Yes  Schedule with caitlin on 2/26 ?

## 2019-04-21 NOTE — Telephone Encounter (Signed)
I spoke with the patient. He confirms that he had 1 episode of chest pressure on Saturday that was relieved with 1 NTG.  He has just felt fatigued since then.  He denies any radiating symptoms/ nausea/ vomiting/ diaphoresis.   The patient states he has had an MI in the past, but symptoms on Saturday were not as severe as that.  He denies any symptoms of chest pressure since Saturday. The patient has been scheduled to see Laurann Montana, NP on 04/23/19 at 8:00 am.  I have advised the patient to please keep this appointment for further evaluation. However, he has also been advised if his symptoms reoccur and he has relief with NTG, then he should call 911/ report to the nearest ER.  The patient voices understanding of the above and is agreeable. He was very appreciative for the call back.
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
## 2019-04-23 ENCOUNTER — Ambulatory Visit (INDEPENDENT_AMBULATORY_CARE_PROVIDER_SITE_OTHER): Payer: PPO | Admitting: Family

## 2019-04-23 ENCOUNTER — Encounter: Payer: Self-pay | Admitting: Family

## 2019-04-23 ENCOUNTER — Other Ambulatory Visit: Payer: Self-pay

## 2019-04-23 VITALS — BP 90/52 | HR 64 | Ht 71.0 in | Wt 185.8 lb

## 2019-04-23 DIAGNOSIS — E785 Hyperlipidemia, unspecified: Secondary | ICD-10-CM | POA: Diagnosis not present

## 2019-04-23 DIAGNOSIS — Z950 Presence of cardiac pacemaker: Secondary | ICD-10-CM | POA: Diagnosis not present

## 2019-04-23 DIAGNOSIS — I5022 Chronic systolic (congestive) heart failure: Secondary | ICD-10-CM

## 2019-04-23 DIAGNOSIS — I959 Hypotension, unspecified: Secondary | ICD-10-CM | POA: Diagnosis not present

## 2019-04-23 DIAGNOSIS — K219 Gastro-esophageal reflux disease without esophagitis: Secondary | ICD-10-CM

## 2019-04-23 DIAGNOSIS — I255 Ischemic cardiomyopathy: Secondary | ICD-10-CM

## 2019-04-23 DIAGNOSIS — I2510 Atherosclerotic heart disease of native coronary artery without angina pectoris: Secondary | ICD-10-CM

## 2019-04-23 MED ORDER — TICAGRELOR 60 MG PO TABS: 60.0000 mg | ORAL_TABLET | Freq: Two times a day (BID) | ORAL | 3 refills | Status: DC

## 2019-04-23 NOTE — Patient Instructions (Signed)
Medication Instructions:  Your physician has recommended you make the following change in your medication:   We will change your Brilinta to 60mg  twice daily.  You may use up your current 90mg  tablets.   *If you need a refill on your cardiac medications before your next appointment, please call your pharmacy*  Lab Work: No lab work today  Testing/Procedures: You EKG today looked great!   Follow-Up: At [facility], you and your health needs are our priority.  As part of our continuing mission to provide you with exceptional heart care, we have created designated Provider Care Teams.  These Care Teams include your primary Cardiologist (physician) and Advanced Practice Providers (APPs -  Physician Assistants and Nurse Practitioners) who all work together to provide you with the care you need, when you need it.  We recommend signing up for the patient portal called "MyChart".  Sign up information is provided on this After Visit Summary.  MyChart is used to connect with patients for Virtual Visits (Telemedicine).  Patients are able to view lab/test results, encounter notes, upcoming appointments, etc.  Non-urgent messages can be sent to your provider as well.   To learn more about what you can do with MyChart, go to [email or web address].    Your next appointment:   1 year(s)  The format for your next appointment:   In Person  Provider:   Virl Axe, MD  Other Instructions

## 2019-06-07 ENCOUNTER — Other Ambulatory Visit: Payer: Self-pay | Admitting: Internal Medicine

## 2019-06-09 ENCOUNTER — Ambulatory Visit (INDEPENDENT_AMBULATORY_CARE_PROVIDER_SITE_OTHER): Payer: PPO | Admitting: *Deleted

## 2019-06-09 DIAGNOSIS — I441 Atrioventricular block, second degree: Secondary | ICD-10-CM

## 2019-06-09 LAB — CUP PACEART REMOTE DEVICE CHECK
Battery Remaining Longevity: 71 mo
Battery Remaining Percentage: 95.5 %
Battery Voltage: 2.96 V
Brady Statistic AP VP Percent: 89 %
Brady Statistic AP VS Percent: 1 %
Brady Statistic AS VP Percent: 10 %
Brady Statistic AS VS Percent: 1.1 %
Brady Statistic RA Percent Paced: 88 %
Date Time Interrogation Session: 20210414053600
Implantable Lead Implant Date: 20190110
Implantable Lead Implant Date: 20190110
Implantable Lead Implant Date: 20190110
Implantable Lead Location: 753858
Implantable Lead Location: 753859
Implantable Lead Location: 753860
Implantable Pulse Generator Implant Date: 20190110
Lead Channel Impedance Value: 410 Ohm
Lead Channel Impedance Value: 630 Ohm
Lead Channel Impedance Value: 860 Ohm
Lead Channel Pacing Threshold Amplitude: 0.5 V
Lead Channel Pacing Threshold Amplitude: 0.75 V
Lead Channel Pacing Threshold Amplitude: 1.75 V
Lead Channel Pacing Threshold Pulse Width: 0.5 ms
Lead Channel Pacing Threshold Pulse Width: 0.5 ms
Lead Channel Pacing Threshold Pulse Width: 1.5 ms
Lead Channel Sensing Intrinsic Amplitude: 12 mV
Lead Channel Sensing Intrinsic Amplitude: 4.7 mV
Lead Channel Setting Pacing Amplitude: 2 V
Lead Channel Setting Pacing Amplitude: 2.5 V
Lead Channel Setting Pacing Amplitude: 2.5 V
Lead Channel Setting Pacing Pulse Width: 0.5 ms
Lead Channel Setting Pacing Pulse Width: 1.5 ms
Lead Channel Setting Sensing Sensitivity: 2 mV
Pulse Gen Model: 3262
Pulse Gen Serial Number: 8942222

## 2019-06-09 NOTE — Progress Notes (Signed)
PPM Remote  

## 2019-06-11 DIAGNOSIS — M47816 Spondylosis without myelopathy or radiculopathy, lumbar region: Secondary | ICD-10-CM | POA: Diagnosis not present

## 2019-06-11 DIAGNOSIS — M5116 Intervertebral disc disorders with radiculopathy, lumbar region: Secondary | ICD-10-CM | POA: Diagnosis not present

## 2019-06-11 DIAGNOSIS — S32000A Wedge compression fracture of unspecified lumbar vertebra, initial encounter for closed fracture: Secondary | ICD-10-CM | POA: Diagnosis not present

## 2019-06-14 ENCOUNTER — Other Ambulatory Visit: Payer: Self-pay | Admitting: Internal Medicine

## 2019-07-30 ENCOUNTER — Other Ambulatory Visit: Payer: Self-pay | Admitting: Emergency Medicine

## 2019-07-30 ENCOUNTER — Encounter: Payer: Self-pay | Admitting: Oncology

## 2019-07-30 ENCOUNTER — Other Ambulatory Visit: Payer: Self-pay

## 2019-07-30 DIAGNOSIS — C9110 Chronic lymphocytic leukemia of B-cell type not having achieved remission: Secondary | ICD-10-CM

## 2019-07-30 NOTE — Progress Notes (Signed)
Patient prescreened for appointment. Patient has no concerns or questions.  

## 2019-07-30 NOTE — Progress Notes (Signed)
[place]  Telephone:(336) [phone] Fax:(336) [phone]  ID: Christopher Daniels OB: 1934/07/09  MR#: [id number]  VQQ#:[id number]  Patient Care Team: Rusty Aus, MD as PCP - General (Internal Medicine) Deboraha Sprang, MD as PCP - Cardiology (Cardiology) Lloyd Huger, MD as Consulting Physician (Oncology)  CHIEF COMPLAINT: CLL, iron deficiency anemia.  INTERVAL HISTORY: Patient returns to clinic today for repeat laboratory work and further evaluation.  Christopher Daniels continues to feel well and remains asymptomatic.  Christopher Daniels denies any weakness or fatigue.  Christopher Daniels has no neurologic complaints.  Christopher Daniels has no fevers or night sweats.  Christopher Daniels has a good appetite and denies weight loss. Christopher Daniels denies any chest pain, cough, hemoptysis, or shortness of breath.  Christopher Daniels denies any nausea, vomiting, constipation, or diarrhea.  Christopher Daniels has no melena or hematochezia.  Christopher Daniels has no urinary complaints.  Patient offers no specific complaints today.  REVIEW OF SYSTEMS:   Review of Systems  Constitutional: Negative.  Negative for fever, malaise/fatigue and weight loss.  Respiratory: Negative.  Negative for cough and shortness of breath.   Cardiovascular: Negative.  Negative for chest pain and leg swelling.  Gastrointestinal: Negative.  Negative for abdominal pain, blood in stool, constipation and melena.  Genitourinary: Negative.  Negative for dysuria and hematuria.  Musculoskeletal: Negative.  Negative for back pain.  Skin: Negative.  Negative for rash.  Neurological: Negative.  Negative for sensory change, focal weakness and weakness.  Endo/Heme/Allergies: Does not bruise/bleed easily.  Psychiatric/Behavioral: Negative.  The patient is not nervous/anxious.     As per HPI. Otherwise, a complete review of systems is negative.  PAST MEDICAL HISTORY: Past Medical History:  Diagnosis Date   Arthritis    "joints; fingers, knees" (03/06/2017)   CAD (coronary artery disease)    a. 01/2016 Inf STEMI->PCI/Thrombectomy/DES  to mid RCA (4.0 x 30 Resolute DES), EF 25-30%; b. 01/2016 Staged PCI/DES to mLAD (3.0x22 Resolute DES); c. 10/2016 Cath: LM nl, LAD patent stent, 30d, D2 70, LCX nl, OM1/2 nl, RCA patent mid/dist stent.   CLL (chronic lymphocytic leukemia) ([place])    "never taken any RX; just monitor this twice/year" (03/06/2017)   Diverticulitis    GERD (gastroesophageal reflux disease)    H/O Bell's palsy 1960s   HFrEF (heart failure with reduced ejection fraction) (H[facility])    High cholesterol    History of kidney stones    HOH (hard of hearing)    Iron deficiency anemia    Ischemic cardiomyopathy    a. 01/2016 EF 25-30%; b. 04/2016 f/u echo: EF 36%, mild glob HK, basal and mid inflat, inf, infsept, mid apical inf AK, mildly dil LA/RA, mild AI/MR, trace TR; c. 06/2016 Cardiac MRI: EF 36%, apical AK, mod diff HK, septal-lateral dyssynchrony   LBBB (left bundle branch block)    Mobitz type 2 second degree AV block 03/06/2017   Myocardial infarction ([place]) 01/27/2016   Presence of permanent cardiac pacemaker    Skin cancer    a. on head years ago   Tachy-brady syndrome ([place])    a. 02/2017 s/p SJM 3262 Quadra Allur MP CRT-P.    PAST SURGICAL HISTORY: Past Surgical History:  Procedure Laterality Date   BACK SURGERY     BI-VENTRICULAR PACEMAKER INSERTION (CRT-P)  03/06/2017   BIV PACEMAKER INSERTION CRT-P N/A 03/06/2017   Procedure: BIV PACEMAKER INSERTION CRT-P;  Surgeon: Evans Lance, MD;  Location: [place] [facility];  Service: Cardiovascular;  Laterality: N/A;   CARDIAC CATHETERIZATION N/A 07/29/2014  Procedure: Left Heart Cath;  Surgeon: Teodoro Spray, MD;  Location: [place] [facility];  Service: Cardiovascular;  Laterality: N/A;   CARDIAC CATHETERIZATION N/A 01/27/2016   Procedure: Left Heart Cath and Coronary Angiography;  Surgeon: [name], MD;  Location: [facility] [place] [facility];  Service: Cardiovascular;  Laterality: N/A;   CARDIAC CATHETERIZATION N/A 01/27/2016   Procedure: Coronary  Stent Intervention;  Surgeon: [name], MD;  Location: [place] [facility];  Service: Cardiovascular;  Laterality: N/A;   CARDIAC CATHETERIZATION N/A 02/13/2016   Procedure: Coronary Stent Intervention;  Surgeon: [name], MD;  Location: [place] [facility];  Service: Cardiovascular;  Laterality: N/A;   CARDIAC CATHETERIZATION N/A 02/13/2016   Procedure: Left Heart Cath and Coronary Angiography;  Surgeon: [name], MD;  Location: [facility];  Service: Cardiovascular;  Laterality: N/A;   CARDIAC CATHETERIZATION     CATARACT EXTRACTION W/PHACO Right 07/18/2014   Procedure: CATARACT EXTRACTION PHACO AND INTRAOCULAR LENS PLACEMENT (IOC);  Surgeon: Estill Cotta, MD;  Location: [facility];  Service: Ophthalmology;  Laterality: Right;  [place] 03:11 AP% 28.4 CDE 75.64   CATARACT EXTRACTION W/PHACO Left 06/19/2015   Procedure: CATARACT EXTRACTION PHACO AND INTRAOCULAR LENS PLACEMENT (IOC);  Surgeon: Estill Cotta, MD;  Location: [facility];  Service: Ophthalmology;  Laterality: Left;  [place] 02:36.5 AP% 27.7 CDE 69.51 fluid pack lot # [id number]   COLONOSCOPY WITH PROPOFOL N/A 04/20/2018   Procedure: COLONOSCOPY WITH PROPOFOL;  Surgeon: Manya Silvas, MD;  Location[facility];  Service: Endoscopy;  Laterality: N/A;   ESOPHAGOGASTRODUODENOSCOPY (EGD) WITH PROPOFOL N/A 04/20/2018   Procedure: ESOPHAGOGASTRODUODENOSCOPY (EGD) WITH PROPOFOL;  Surgeon: Manya Silvas, MD;  Location: [facility] ENDOSCOPY;  Service: Endoscopy;  Laterality: N/A;   EYE SURGERY     INGUINAL HERNIA REPAIR Left 2010   INSERT / REPLACE / REMOVE PACEMAKER     LEFT HEART CATH AND CORONARY ANGIOGRAPHY N/A 11/18/2016   Procedure: LEFT HEART CATH AND CORONARY ANGIOGRAPHY;  Surgeon: Wellington Hampshire, MD;  Location: [place] [facility];  Service: Cardiovascular;  Laterality: N/A;   [place]   "ruptured disc; took a piece off my buttocks and fixed it"   SKIN CANCER EXCISION     "top of my head"    TONSILLECTOMY      FAMILY HISTORY: Reviewed and unchanged. No reported history of malignancy or chronic disease.     ADVANCED DIRECTIVES:    HEALTH MAINTENANCE: Social History   Tobacco Use   Smoking status: Former Smoker    Years: 4.00    Types: Pipe, Cigars    Quit date: 1960    Years since quitting: 61.4   Smokeless tobacco: Former Systems developer    Types: Chew    Quit date: 1960  Substance Use Topics   Alcohol use: No   Drug use: Never     Colonoscopy:  PAP:  Bone density:  Lipid panel:  Allergies  Allergen Reactions   Niaspan [Niacin Er] Anaphylaxis and Other (See Comments)    Episode happened 10 to 15 years ago.  Has not tried any other similar meds since   Adhesive [Tape] Other (See Comments)    Skin is very sensitive; PLEASE USE PAPER TAPE!!    Current Outpatient Medications  Medication Sig Dispense Refill   aspirin 81 MG tablet Take 81 mg by mouth daily.      atorvastatin (LIPITOR) 80 MG tablet Take 1 tablet (80 mg total) by mouth every evening. 90 tablet 3  Calcium Carbonate-Vit D-Min (CALTRATE PLUS PO) Take by mouth 2 (two) times daily. Taking tablets  2 daily     carboxymethylcellulose (REFRESH TEARS) 0.5 % SOLN Place 1 drop into both eyes 3 (three) times daily as needed (for dry eyes).      carvedilol (COREG) 3.125 MG tablet TAKE 1 TABLET BY MOUTH TWICE DAILY 180 tablet 1   co-enzyme Q-10 30 MG capsule Take 30 mg by mouth daily.     Cyanocobalamin (B-12) 500 MCG SUBL Place 500 mcg under the tongue once a week.      ferrous gluconate (FERGON) 324 MG tablet Take 1 tablet by mouth 2 (two) times daily.     lisinopril (PRINIVIL,ZESTRIL) 5 MG tablet Take 2.5 mg by mouth daily.     pantoprazole (PROTONIX) 40 MG tablet Take 40 mg by mouth daily. Taking 1 tablet daily     spironolactone (ALDACTONE) 25 MG tablet TAKE 1/2 TABLET ONCE A DAY 45 tablet 2   ticagrelor (BRILINTA) 60 MG TABS tablet Take 1 tablet (60 mg total) by mouth 2 (two) times daily. 180  tablet 3   nitroGLYCERIN (NITROSTAT) 0.4 MG SL tablet Place 1 tablet (0.4 mg total) under the tongue every 5 (five) minutes x 3 doses as needed for chest pain. (Patient not taking: Reported on 07/30/2019) 25 tablet 3   traMADol (ULTRAM) 50 MG tablet Take 50 mg by mouth 2 (two) times daily as needed.     No current facility-administered medications for this visit.    OBJECTIVE: Vitals:   08/02/19 1309  BP: 116/76  Pulse: 68  Resp: 18  Temp: 97.9 F (36.6 C)  SpO2: 100%     Body mass index is 25.83 kg/m.    ECOG FS:0 - Asymptomatic  General: Well-developed, well-nourished, no acute distress. Eyes: Pink conjunctiva, anicteric sclera. HEENT: Normocephalic, moist mucous membranes. Lungs: No audible wheezing or coughing. Heart: Regular rate and rhythm. Abdomen: Soft, nontender, no obvious distention. Musculoskeletal: No edema, cyanosis, or clubbing. Neuro: Alert, answering all questions appropriately. Cranial nerves grossly intact. Skin: No rashes or petechiae noted. Psych: Normal affect.  LAB RESULTS:  Lab Results  Component Value Date   NA 135 12/11/2017   K 4.3 12/11/2017   CL 103 12/11/2017   CO2 26 12/11/2017   GLUCOSE 84 12/11/2017   BUN 13 12/11/2017   CREATININE 1.00 05/13/2018   CALCIUM 9.0 12/11/2017   PROT 5.6 (L) 01/27/2016   ALBUMIN 3.4 (L) 01/27/2016   AST 21 01/27/2016   ALT 17 01/27/2016   ALKPHOS 53 01/27/2016   BILITOT 0.7 01/27/2016   GFRNONAA >60 12/11/2017   GFRAA >60 12/11/2017    Lab Results  Component Value Date   WBC 11.1 (H) 08/02/2019   NEUTROABS 3.5 08/02/2019   HGB 12.6 (L) 08/02/2019   HCT 38.1 (L) 08/02/2019   MCV 99.5 08/02/2019   PLT 192 08/02/2019   Lab Results  Component Value Date   IRON 99 08/02/2019   TIBC 266 08/02/2019   IRONPCTSAT 37 08/02/2019   Lab Results  Component Value Date   FERRITIN 35 08/02/2019     STUDIES: No results found.  ASSESSMENT: CLL, Rai stage 0.  Iron deficiency anemia.  PLAN:    1.   CLL: Patient's white blood cell count is only mildly elevated at 11.1.  Previously, flow cytometry confirmed a monoclonal B-cell population consistent with CLL. CT in August 2014 was reviewed independently and did not reveal any underlying lymphadenopathy.  This does not need to  be repeated unless there is concern of progression of disease.  No interventions are needed at this time.  Patient does not require bone marrow biopsy.  Return to clinic in 6 months with repeat laboratory work and video assisted telemedicine visit.  If white blood cell count remained stable, patient can likely be transitioned to yearly visits.  2.  Iron deficiency anemia: Resolved.  Patient's hemoglobin is essentially within normal limits and iron stores continue to be within normal limits. Colonoscopy and EGD on April 20, 2018 did not reveal any distinct pathology or evidence of bleeding.  Patient does not require additional IV Feraheme.  Christopher Daniels last received treatment on May 26, 2018.  Christopher Daniels has been instructed to decrease his oral iron dose to 1 tabs a day.  To help reduce constipation.  Return to clinic in 6 months as above.  If iron stores remain within normal limits, may consider eliminating oral iron supplementation completely.      Patient expressed understanding and was in agreement with this plan. Christopher Daniels also understands that Christopher Daniels can call clinic at any time with any questions, concerns, or complaints.    Lloyd Huger, MD   08/03/2019 8:42 AM

## 2019-08-02 ENCOUNTER — Other Ambulatory Visit: Payer: Self-pay

## 2019-08-02 ENCOUNTER — Inpatient Hospital Stay: Payer: PPO | Admitting: Oncology

## 2019-08-02 ENCOUNTER — Encounter: Payer: Self-pay | Admitting: Oncology

## 2019-08-02 ENCOUNTER — Inpatient Hospital Stay: Payer: PPO

## 2019-08-02 ENCOUNTER — Inpatient Hospital Stay: Payer: PPO | Attending: Oncology

## 2019-08-02 VITALS — BP 116/76 | HR 68 | Temp 97.9°F | Resp 18 | Wt 185.2 lb

## 2019-08-02 DIAGNOSIS — D509 Iron deficiency anemia, unspecified: Secondary | ICD-10-CM | POA: Diagnosis not present

## 2019-08-02 DIAGNOSIS — Z87891 Personal history of nicotine dependence: Secondary | ICD-10-CM | POA: Insufficient documentation

## 2019-08-02 DIAGNOSIS — C9110 Chronic lymphocytic leukemia of B-cell type not having achieved remission: Secondary | ICD-10-CM | POA: Insufficient documentation

## 2019-08-02 LAB — IRON AND TIBC
Iron: 99 ug/dL (ref 45–182)
Saturation Ratios: 37 % (ref 17.9–39.5)
TIBC: 266 ug/dL (ref 250–450)
UIBC: 167 ug/dL

## 2019-08-02 LAB — CBC WITH DIFFERENTIAL/PLATELET
Abs Immature Granulocytes: 0.04 10*3/uL (ref 0.00–0.07)
Basophils Absolute: 0.1 10*3/uL (ref 0.0–0.1)
Basophils Relative: 1 %
Eosinophils Absolute: 0.1 10*3/uL (ref 0.0–0.5)
Eosinophils Relative: 1 %
HCT: 38.1 % — ABNORMAL LOW (ref 39.0–52.0)
Hemoglobin: 12.6 g/dL — ABNORMAL LOW (ref 13.0–17.0)
Immature Granulocytes: 0 %
Lymphocytes Relative: 59 %
Lymphs Abs: 6.6 10*3/uL — ABNORMAL HIGH (ref 0.7–4.0)
MCH: 32.9 pg (ref 26.0–34.0)
MCHC: 33.1 g/dL (ref 30.0–36.0)
MCV: 99.5 fL (ref 80.0–100.0)
Monocytes Absolute: 0.8 10*3/uL (ref 0.1–1.0)
Monocytes Relative: 8 %
Neutro Abs: 3.5 10*3/uL (ref 1.7–7.7)
Neutrophils Relative %: 31 %
Platelets: 192 10*3/uL (ref 150–400)
RBC: 3.83 MIL/uL — ABNORMAL LOW (ref 4.22–5.81)
RDW: 13.9 % (ref 11.5–15.5)
WBC: 11.1 10*3/uL — ABNORMAL HIGH (ref 4.0–10.5)
nRBC: 0 % (ref 0.0–0.2)

## 2019-08-02 LAB — FERRITIN: Ferritin: 35 ng/mL (ref 24–336)

## 2019-08-24 DIAGNOSIS — E538 Deficiency of other specified B group vitamins: Secondary | ICD-10-CM | POA: Diagnosis not present

## 2019-08-24 DIAGNOSIS — D500 Iron deficiency anemia secondary to blood loss (chronic): Secondary | ICD-10-CM | POA: Diagnosis not present

## 2019-08-24 DIAGNOSIS — E782 Mixed hyperlipidemia: Secondary | ICD-10-CM | POA: Diagnosis not present

## 2019-08-31 DIAGNOSIS — I420 Dilated cardiomyopathy: Secondary | ICD-10-CM | POA: Diagnosis not present

## 2019-08-31 DIAGNOSIS — C9110 Chronic lymphocytic leukemia of B-cell type not having achieved remission: Secondary | ICD-10-CM | POA: Diagnosis not present

## 2019-08-31 DIAGNOSIS — E538 Deficiency of other specified B group vitamins: Secondary | ICD-10-CM | POA: Diagnosis not present

## 2019-08-31 DIAGNOSIS — Z125 Encounter for screening for malignant neoplasm of prostate: Secondary | ICD-10-CM | POA: Diagnosis not present

## 2019-08-31 DIAGNOSIS — I700 Atherosclerosis of aorta: Secondary | ICD-10-CM | POA: Diagnosis not present

## 2019-08-31 DIAGNOSIS — D500 Iron deficiency anemia secondary to blood loss (chronic): Secondary | ICD-10-CM | POA: Diagnosis not present

## 2019-09-08 ENCOUNTER — Ambulatory Visit (INDEPENDENT_AMBULATORY_CARE_PROVIDER_SITE_OTHER): Payer: PPO | Admitting: *Deleted

## 2019-09-08 DIAGNOSIS — I441 Atrioventricular block, second degree: Secondary | ICD-10-CM | POA: Diagnosis not present

## 2019-09-08 LAB — CUP PACEART REMOTE DEVICE CHECK
Battery Remaining Longevity: 70 mo
Battery Remaining Percentage: 95.5 %
Battery Voltage: 2.96 V
Brady Statistic AP VP Percent: 87 %
Brady Statistic AP VS Percent: 1 %
Brady Statistic AS VP Percent: 12 %
Brady Statistic AS VS Percent: 1 %
Brady Statistic RA Percent Paced: 87 %
Date Time Interrogation Session: 20210714022431
Implantable Lead Implant Date: 20190110
Implantable Lead Implant Date: 20190110
Implantable Lead Implant Date: 20190110
Implantable Lead Location: 753858
Implantable Lead Location: 753859
Implantable Lead Location: 753860
Implantable Pulse Generator Implant Date: 20190110
Lead Channel Impedance Value: 400 Ohm
Lead Channel Impedance Value: 590 Ohm
Lead Channel Impedance Value: 840 Ohm
Lead Channel Pacing Threshold Amplitude: 0.5 V
Lead Channel Pacing Threshold Amplitude: 0.75 V
Lead Channel Pacing Threshold Amplitude: 1.75 V
Lead Channel Pacing Threshold Pulse Width: 0.5 ms
Lead Channel Pacing Threshold Pulse Width: 0.5 ms
Lead Channel Pacing Threshold Pulse Width: 1.5 ms
Lead Channel Sensing Intrinsic Amplitude: 12 mV
Lead Channel Sensing Intrinsic Amplitude: 5 mV
Lead Channel Setting Pacing Amplitude: 2 V
Lead Channel Setting Pacing Amplitude: 2.5 V
Lead Channel Setting Pacing Amplitude: 2.5 V
Lead Channel Setting Pacing Pulse Width: 0.5 ms
Lead Channel Setting Pacing Pulse Width: 1.5 ms
Lead Channel Setting Sensing Sensitivity: 2 mV
Pulse Gen Model: 3262
Pulse Gen Serial Number: 8942222

## 2019-09-09 NOTE — Progress Notes (Signed)
Remote pacemaker transmission.   

## 2019-09-14 ENCOUNTER — Other Ambulatory Visit: Payer: Self-pay | Admitting: Internal Medicine

## 2019-10-22 DIAGNOSIS — K5792 Diverticulitis of intestine, part unspecified, without perforation or abscess without bleeding: Secondary | ICD-10-CM | POA: Diagnosis not present

## 2019-11-16 DIAGNOSIS — N62 Hypertrophy of breast: Secondary | ICD-10-CM | POA: Diagnosis not present

## 2019-11-16 DIAGNOSIS — Z23 Encounter for immunization: Secondary | ICD-10-CM | POA: Diagnosis not present

## 2019-11-29 ENCOUNTER — Other Ambulatory Visit: Payer: Self-pay | Admitting: Internal Medicine

## 2019-12-01 NOTE — Telephone Encounter (Signed)
This is a [place] pt 

## 2019-12-08 ENCOUNTER — Ambulatory Visit (INDEPENDENT_AMBULATORY_CARE_PROVIDER_SITE_OTHER): Payer: PPO

## 2019-12-08 DIAGNOSIS — I441 Atrioventricular block, second degree: Secondary | ICD-10-CM

## 2019-12-10 LAB — CUP PACEART REMOTE DEVICE CHECK
Battery Remaining Longevity: 65 mo
Battery Remaining Percentage: 89 %
Battery Voltage: 2.95 V
Brady Statistic AP VP Percent: 88 %
Brady Statistic AP VS Percent: 1 %
Brady Statistic AS VP Percent: 11 %
Brady Statistic AS VS Percent: 1 %
Brady Statistic RA Percent Paced: 88 %
Date Time Interrogation Session: 20211013020013
Implantable Lead Implant Date: 20190110
Implantable Lead Implant Date: 20190110
Implantable Lead Implant Date: 20190110
Implantable Lead Location: 753858
Implantable Lead Location: 753859
Implantable Lead Location: 753860
Implantable Pulse Generator Implant Date: 20190110
Lead Channel Impedance Value: 410 Ohm
Lead Channel Impedance Value: 630 Ohm
Lead Channel Impedance Value: 840 Ohm
Lead Channel Pacing Threshold Amplitude: 0.5 V
Lead Channel Pacing Threshold Amplitude: 0.75 V
Lead Channel Pacing Threshold Amplitude: 1.75 V
Lead Channel Pacing Threshold Pulse Width: 0.5 ms
Lead Channel Pacing Threshold Pulse Width: 0.5 ms
Lead Channel Pacing Threshold Pulse Width: 1.5 ms
Lead Channel Sensing Intrinsic Amplitude: 12 mV
Lead Channel Sensing Intrinsic Amplitude: 3.9 mV
Lead Channel Setting Pacing Amplitude: 2 V
Lead Channel Setting Pacing Amplitude: 2.5 V
Lead Channel Setting Pacing Amplitude: 2.5 V
Lead Channel Setting Pacing Pulse Width: 0.5 ms
Lead Channel Setting Pacing Pulse Width: 1.5 ms
Lead Channel Setting Sensing Sensitivity: 2 mV
Pulse Gen Model: 3262
Pulse Gen Serial Number: 8942222

## 2019-12-14 NOTE — Progress Notes (Signed)
Remote pacemaker transmission.   

## 2019-12-30 DIAGNOSIS — L538 Other specified erythematous conditions: Secondary | ICD-10-CM | POA: Diagnosis not present

## 2019-12-30 DIAGNOSIS — Z85828 Personal history of other malignant neoplasm of skin: Secondary | ICD-10-CM | POA: Diagnosis not present

## 2019-12-30 DIAGNOSIS — D225 Melanocytic nevi of trunk: Secondary | ICD-10-CM | POA: Diagnosis not present

## 2019-12-30 DIAGNOSIS — L570 Actinic keratosis: Secondary | ICD-10-CM | POA: Diagnosis not present

## 2019-12-30 DIAGNOSIS — D2271 Melanocytic nevi of right lower limb, including hip: Secondary | ICD-10-CM | POA: Diagnosis not present

## 2019-12-30 DIAGNOSIS — D485 Neoplasm of uncertain behavior of skin: Secondary | ICD-10-CM | POA: Diagnosis not present

## 2019-12-30 DIAGNOSIS — L820 Inflamed seborrheic keratosis: Secondary | ICD-10-CM | POA: Diagnosis not present

## 2019-12-30 DIAGNOSIS — D2261 Melanocytic nevi of right upper limb, including shoulder: Secondary | ICD-10-CM | POA: Diagnosis not present

## 2019-12-30 DIAGNOSIS — X32XXXA Exposure to sunlight, initial encounter: Secondary | ICD-10-CM | POA: Diagnosis not present

## 2019-12-30 DIAGNOSIS — D2262 Melanocytic nevi of left upper limb, including shoulder: Secondary | ICD-10-CM | POA: Diagnosis not present

## 2020-01-10 DIAGNOSIS — M47816 Spondylosis without myelopathy or radiculopathy, lumbar region: Secondary | ICD-10-CM | POA: Diagnosis not present

## 2020-01-10 DIAGNOSIS — M4186 Other forms of scoliosis, lumbar region: Secondary | ICD-10-CM | POA: Diagnosis not present

## 2020-01-10 DIAGNOSIS — W19XXXA Unspecified fall, initial encounter: Secondary | ICD-10-CM | POA: Diagnosis not present

## 2020-01-10 DIAGNOSIS — Y92009 Unspecified place in unspecified non-institutional (private) residence as the place of occurrence of the external cause: Secondary | ICD-10-CM | POA: Diagnosis not present

## 2020-01-10 DIAGNOSIS — M7918 Myalgia, other site: Secondary | ICD-10-CM | POA: Diagnosis not present

## 2020-01-10 DIAGNOSIS — R102 Pelvic and perineal pain: Secondary | ICD-10-CM | POA: Diagnosis not present

## 2020-01-10 DIAGNOSIS — N200 Calculus of kidney: Secondary | ICD-10-CM | POA: Diagnosis not present

## 2020-01-19 DIAGNOSIS — M810 Age-related osteoporosis without current pathological fracture: Secondary | ICD-10-CM | POA: Diagnosis not present

## 2020-01-29 NOTE — Progress Notes (Unsigned)
[place]  Telephone:(336) [phone] Fax:(336) [phone]  ID: Christopher Daniels OB: 1934-04-09  MR#: [id number]  [id number]#:[id number]  Patient Care Team: Rusty Aus, MD as PCP - General (Internal Medicine) Deboraha Sprang, MD as PCP - Cardiology (Cardiology) Lloyd Huger, MD as Consulting Physician (Oncology)  I connected with Christopher Daniels on 02/02/20 at  1:15 PM EST by video enabled telemedicine visit and verified that I am speaking with the correct person using two identifiers.   I discussed the limitations, risks, security and privacy concerns of performing an evaluation and management service by telemedicine and the availability of in-person appointments. I also discussed with the patient that there may be a patient responsible charge related to this service. The patient expressed understanding and agreed to proceed.   Other persons participating in the visit and their role in the encounter: Patient, MD.  Patient's location: Home. Provider's location: Clinic.  CHIEF COMPLAINT: CLL, iron deficiency anemia.  INTERVAL HISTORY: Patient initially agreed to video assisted telemedicine visit for further evaluation, but secondary to difficulties appointment was finished by telephone.  He continues to feel well and remains asymptomatic. He denies any weakness or fatigue.  He has no neurologic complaints.  He has no fevers or night sweats.  He has a good appetite and denies weight loss. He denies any chest pain, cough, hemoptysis, or shortness of breath.  He denies any nausea, vomiting, constipation, or diarrhea.  He has no melena or hematochezia.  He has no urinary complaints.  Patient feels at his baseline offers no specific complaints today.  REVIEW OF SYSTEMS:   Review of Systems  Constitutional: Negative.  Negative for fever, malaise/fatigue and weight loss.  Respiratory: Negative.  Negative for cough and shortness of breath.   Cardiovascular: Negative.  Negative  for chest pain and leg swelling.  Gastrointestinal: Negative.  Negative for abdominal pain, blood in stool, constipation and melena.  Genitourinary: Negative.  Negative for dysuria and hematuria.  Musculoskeletal: Negative.  Negative for back pain.  Skin: Negative.  Negative for rash.  Neurological: Negative.  Negative for sensory change, focal weakness and weakness.  Endo/Heme/Allergies: Does not bruise/bleed easily.  Psychiatric/Behavioral: Negative.  The patient is not nervous/anxious.     As per HPI. Otherwise, a complete review of systems is negative.  PAST MEDICAL HISTORY: Past Medical History:  Diagnosis Date  . Arthritis    "joints; fingers, knees" (03/06/2017)  . CAD (coronary artery disease)    a. 01/2016 Inf STEMI->PCI/Thrombectomy/DES to mid RCA (4.0 x 30 Resolute DES), EF 25-30%; b. 01/2016 Staged PCI/DES to mLAD (3.0x22 Resolute DES); c. 10/2016 Cath: LM nl, LAD patent stent, 30d, D2 70, LCX nl, OM1/2 nl, RCA patent mid/dist stent.  . CLL (chronic lymphocytic leukemia) ([place])    "never taken any RX; just monitor this twice/year" (03/06/2017)  . Diverticulitis   . GERD (gastroesophageal reflux disease)   . H/O Bell's palsy 1960s  . HFrEF (heart failure with reduced ejection fraction) ([place])   . High cholesterol   . History of kidney stones   . HOH (hard of hearing)   . Iron deficiency anemia   . Ischemic cardiomyopathy    a. 01/2016 EF 25-30%; b. 04/2016 f/u echo: EF 36%, mild glob HK, basal and mid inflat, inf, infsept, mid apical inf AK, mildly dil LA/RA, mild AI/MR, trace TR; c. 06/2016 Cardiac MRI: EF 36%, apical AK, mod diff HK, septal-lateral dyssynchrony  . LBBB (left bundle branch block)   . Mobitz  type 2 second degree AV block 03/06/2017  . Myocardial infarction ([place]) 01/27/2016  . Presence of permanent cardiac pacemaker   . Skin cancer    a. on head years ago  . Tachy-brady syndrome ([place])    a. 02/2017 s/p SJM 3262 Quadra Allur MP CRT-P.    PAST SURGICAL  HISTORY: Past Surgical History:  Procedure Laterality Date  . BACK SURGERY    . BI-VENTRICULAR PACEMAKER INSERTION (CRT-P)  03/06/2017  . BIV PACEMAKER INSERTION CRT-P N/A 03/06/2017   Procedure: BIV PACEMAKER INSERTION CRT-P;  Surgeon: Evans Lance, MD;  Location: [place] [facility];  Service: Cardiovascular;  Laterality: N/A;  . CARDIAC CATHETERIZATION N/A 07/29/2014   Procedure: Left Heart Cath;  Surgeon: Teodoro Spray, MD;  Location: [place] [facility];  Service: Cardiovascular;  Laterality: N/A;  . CARDIAC CATHETERIZATION N/A 01/27/2016   Procedure: Left Heart Cath and Coronary Angiography;  Surgeon: [name], MD;  Location: [place] [facility];  Service: Cardiovascular;  Laterality: N/A;  . CARDIAC CATHETERIZATION N/A 01/27/2016   Procedure: Coronary Stent Intervention;  Surgeon: [name], MD;  Location: [place] [facility];  Service: Cardiovascular;  Laterality: N/A;  . CARDIAC CATHETERIZATION N/A 02/13/2016   Procedure: Coronary Stent Intervention;  Surgeon: [name], MD;  Location: [place] [facility];  Service: Cardiovascular;  Laterality: N/A;  . CARDIAC CATHETERIZATION N/A 02/13/2016   Procedure: Left Heart Cath and Coronary Angiography;  Surgeon: [name], MD;  Location: [place] [facility];  Service: Cardiovascular;  Laterality: N/A;  . CARDIAC CATHETERIZATION    . CATARACT EXTRACTION W/PHACO Right 07/18/2014   Procedure: CATARACT EXTRACTION PHACO AND INTRAOCULAR LENS PLACEMENT (IOC);  Surgeon: Estill Cotta, MD;  Location: [facility];  Service: Ophthalmology;  Laterality: Right;  [place] 03:11 AP% 28.4 CDE 75.64  . CATARACT EXTRACTION W/PHACO Left 06/19/2015   Procedure: CATARACT EXTRACTION PHACO AND INTRAOCULAR LENS PLACEMENT (IOC);  Surgeon: Estill Cotta, MD;  Location: [facility];  Service: Ophthalmology;  Laterality: Left;  [place] 02:36.5 AP% 27.7 CDE 69.51 fluid pack lot # [id number]  . COLONOSCOPY WITH PROPOFOL N/A 04/20/2018   Procedure: COLONOSCOPY WITH PROPOFOL;  Surgeon:  Manya Silvas, MD;  Location: [facility] ENDOSCOPY;  Service: Endoscopy;  Laterality: N/A;  . ESOPHAGOGASTRODUODENOSCOPY (EGD) WITH PROPOFOL N/A 04/20/2018   Procedure: ESOPHAGOGASTRODUODENOSCOPY (EGD) WITH PROPOFOL;  Surgeon: Manya Silvas, MD;  Location: [facility] ENDOSCOPY;  Service: Endoscopy;  Laterality: N/A;  . EYE SURGERY    . INGUINAL HERNIA REPAIR Left 2010  . INSERT / REPLACE / REMOVE PACEMAKER    . LEFT HEART CATH AND CORONARY ANGIOGRAPHY N/A 11/18/2016   Procedure: LEFT HEART CATH AND CORONARY ANGIOGRAPHY;  Surgeon: Wellington Hampshire, MD;  Location: [place] [facility];  Service: Cardiovascular;  Laterality: N/A;  . LUMBAR [place]   "ruptured disc; took a piece off my buttocks and fixed it"  . SKIN CANCER EXCISION     "top of my head"  . TONSILLECTOMY      FAMILY HISTORY: Reviewed and unchanged. No reported history of malignancy or chronic disease.     ADVANCED DIRECTIVES:    HEALTH MAINTENANCE: Social History   Tobacco Use  . Smoking status: Former Smoker    Years: 4.00    Types: Pipe, Cigars    Quit date: 1960    Years since quitting: 61.9  . Smokeless tobacco: Former Systems developer    Types: [place] date: Designer, fashion/clothing  . Vaping Use:  Never used  Substance Use Topics  . Alcohol use: No  . Drug use: Never     Colonoscopy:  PAP:  Bone density:  Lipid panel:  Allergies  Allergen Reactions  . Niaspan [Niacin Er] Anaphylaxis and Other (See Comments)    Episode happened 10 to 15 years ago.  Has not tried any other similar meds since  . Adhesive [Tape] Other (See Comments)    Skin is very sensitive; PLEASE USE PAPER TAPE!!    Current Outpatient Medications  Medication Sig Dispense Refill  . aspirin 81 MG tablet Take 81 mg by mouth daily.     Marland Kitchen atorvastatin (LIPITOR) 80 MG tablet Take 1 tablet (80 mg total) by mouth every evening. 90 tablet 3  . Calcium Carbonate-Vit D-Min (CALTRATE PLUS PO) Take by mouth 2 (two) times daily. Taking tablets  2  daily    . carboxymethylcellulose (REFRESH TEARS) 0.5 % SOLN Place 1 drop into both eyes 3 (three) times daily as needed (for dry eyes).     . carvedilol (COREG) 3.125 MG tablet TAKE 1 TABLET BY MOUTH TWICE DAILY 180 tablet 1  . co-enzyme Q-10 30 MG capsule Take 30 mg by mouth daily.    . Cyanocobalamin (B-12) 500 MCG SUBL Place 500 mcg under the tongue once a week.     Marland Kitchen lisinopril (PRINIVIL,ZESTRIL) 5 MG tablet Take 2.5 mg by mouth daily.    . pantoprazole (PROTONIX) 40 MG tablet Take 40 mg by mouth daily. Taking 1 tablet daily    . spironolactone (ALDACTONE) 25 MG tablet TAKE 1/2 TABLET ONCE A DAY 45 tablet 2  . ticagrelor (BRILINTA) 60 MG TABS tablet Take 1 tablet (60 mg total) by mouth 2 (two) times daily. 180 tablet 3  . traMADol (ULTRAM) 50 MG tablet Take 50 mg by mouth 2 (two) times daily as needed.    . ferrous gluconate (FERGON) 324 MG tablet Take 1 tablet by mouth 2 (two) times daily.    . nitroGLYCERIN (NITROSTAT) 0.4 MG SL tablet Place 1 tablet (0.4 mg total) under the tongue every 5 (five) minutes x 3 doses as needed for chest pain. (Patient not taking: Reported on 07/30/2019) 25 tablet 3   No current facility-administered medications for this visit.    OBJECTIVE: There were no vitals filed for this visit.   There is no height or weight on file to calculate BMI.    ECOG FS:0 - Asymptomatic  General: Well-developed, well-nourished, no acute distress. HEENT: Normocephalic. Neuro: Alert, answering all questions appropriately. Cranial nerves grossly intact. Psych: Normal affect.  LAB RESULTS:  Lab Results  Component Value Date   NA 135 12/11/2017   K 4.3 12/11/2017   CL 103 12/11/2017   CO2 26 12/11/2017   GLUCOSE 84 12/11/2017   BUN 13 12/11/2017   CREATININE 1.00 05/13/2018   CALCIUM 9.0 12/11/2017   PROT 5.6 (L) 01/27/2016   ALBUMIN 3.4 (L) 01/27/2016   AST 21 01/27/2016   ALT 17 01/27/2016   ALKPHOS 53 01/27/2016   BILITOT 0.7 01/27/2016   GFRNONAA >60 12/11/2017    GFRAA >60 12/11/2017    Lab Results  Component Value Date   WBC 9.6 02/01/2020   NEUTROABS 2.9 02/01/2020   HGB 12.2 (L) 02/01/2020   HCT 37.0 (L) 02/01/2020   MCV 100.0 02/01/2020   PLT 249 02/01/2020   Lab Results  Component Value Date   IRON 96 02/01/2020   TIBC 291 02/01/2020   IRONPCTSAT 33 02/01/2020   Lab  Results  Component Value Date   FERRITIN 25 02/01/2020     STUDIES: No results found.  ASSESSMENT: CLL, Rai stage 0.  Iron deficiency anemia.  PLAN:    1.  CLL: Patient's white blood cell count is within normal limits at 9.6 today.  Previously, flow cytometry confirmed a monoclonal B-cell population consistent with CLL. CT in August 2014 was reviewed independently and did not reveal any underlying lymphadenopathy.  This does not need to be repeated unless there is concern of progression of disease.  No interventions are needed at this time.  Patient does not require bone marrow biopsy.  Given minimal disease burden, patient can be transitioned to laboratory work and evaluation on a yearly basis.  Return to clinic in 1 year with video assisted telemedicine visit. 2.  Iron deficiency anemia: Resolved.  Patient's hemoglobin and iron stores continue to be within normal limits.  Colonoscopy and EGD on April 20, 2018 did not reveal any distinct pathology or evidence of bleeding.  Patient does not require additional IV Feraheme.  He last received treatment on May 26, 2018.  Continue oral iron supplementation.  Return to clinic in 1 year as above.  I provided 20 minutes of face-to-face video visit time during this encounter which included chart review, counseling, and coordination of care as documented above.   Patient expressed understanding and was in agreement with this plan. He also understands that He can call clinic at any time with any questions, concerns, or complaints.    Lloyd Huger, MD   02/02/2020 5:53 PM

## 2020-01-31 ENCOUNTER — Inpatient Hospital Stay: Payer: PPO

## 2020-02-01 ENCOUNTER — Encounter: Payer: Self-pay | Admitting: Oncology

## 2020-02-01 ENCOUNTER — Inpatient Hospital Stay: Payer: PPO | Attending: Oncology | Admitting: Oncology

## 2020-02-01 ENCOUNTER — Inpatient Hospital Stay: Payer: PPO

## 2020-02-01 ENCOUNTER — Other Ambulatory Visit: Payer: Self-pay

## 2020-02-01 DIAGNOSIS — C9110 Chronic lymphocytic leukemia of B-cell type not having achieved remission: Secondary | ICD-10-CM | POA: Insufficient documentation

## 2020-02-01 DIAGNOSIS — D509 Iron deficiency anemia, unspecified: Secondary | ICD-10-CM | POA: Diagnosis not present

## 2020-02-01 LAB — CBC WITH DIFFERENTIAL/PLATELET
Abs Immature Granulocytes: 0.03 10*3/uL (ref 0.00–0.07)
Basophils Absolute: 0.1 10*3/uL (ref 0.0–0.1)
Basophils Relative: 1 %
Eosinophils Absolute: 0.2 10*3/uL (ref 0.0–0.5)
Eosinophils Relative: 2 %
HCT: 37 % — ABNORMAL LOW (ref 39.0–52.0)
Hemoglobin: 12.2 g/dL — ABNORMAL LOW (ref 13.0–17.0)
Immature Granulocytes: 0 %
Lymphocytes Relative: 61 %
Lymphs Abs: 5.9 10*3/uL — ABNORMAL HIGH (ref 0.7–4.0)
MCH: 33 pg (ref 26.0–34.0)
MCHC: 33 g/dL (ref 30.0–36.0)
MCV: 100 fL (ref 80.0–100.0)
Monocytes Absolute: 0.6 10*3/uL (ref 0.1–1.0)
Monocytes Relative: 6 %
Neutro Abs: 2.9 10*3/uL (ref 1.7–7.7)
Neutrophils Relative %: 30 %
Platelets: 249 10*3/uL (ref 150–400)
RBC: 3.7 MIL/uL — ABNORMAL LOW (ref 4.22–5.81)
RDW: 13.7 % (ref 11.5–15.5)
WBC: 9.6 10*3/uL (ref 4.0–10.5)
nRBC: 0 % (ref 0.0–0.2)

## 2020-02-01 LAB — FERRITIN: Ferritin: 25 ng/mL (ref 24–336)

## 2020-02-01 LAB — IRON AND TIBC
Iron: 96 ug/dL (ref 45–182)
Saturation Ratios: 33 % (ref 17.9–39.5)
TIBC: 291 ug/dL (ref 250–450)
UIBC: 195 ug/dL

## 2020-02-01 NOTE — Progress Notes (Signed)
Patient denies any concerns today.  

## 2020-02-14 DIAGNOSIS — Z03818 Encounter for observation for suspected exposure to other biological agents ruled out: Secondary | ICD-10-CM | POA: Diagnosis not present

## 2020-03-01 DIAGNOSIS — E538 Deficiency of other specified B group vitamins: Secondary | ICD-10-CM | POA: Diagnosis not present

## 2020-03-01 DIAGNOSIS — Z125 Encounter for screening for malignant neoplasm of prostate: Secondary | ICD-10-CM | POA: Diagnosis not present

## 2020-03-01 DIAGNOSIS — D500 Iron deficiency anemia secondary to blood loss (chronic): Secondary | ICD-10-CM | POA: Diagnosis not present

## 2020-03-01 DIAGNOSIS — I420 Dilated cardiomyopathy: Secondary | ICD-10-CM | POA: Diagnosis not present

## 2020-03-08 ENCOUNTER — Ambulatory Visit (INDEPENDENT_AMBULATORY_CARE_PROVIDER_SITE_OTHER): Payer: PPO

## 2020-03-08 DIAGNOSIS — I441 Atrioventricular block, second degree: Secondary | ICD-10-CM

## 2020-03-10 DIAGNOSIS — I700 Atherosclerosis of aorta: Secondary | ICD-10-CM | POA: Diagnosis not present

## 2020-03-10 DIAGNOSIS — M818 Other osteoporosis without current pathological fracture: Secondary | ICD-10-CM | POA: Diagnosis not present

## 2020-03-10 DIAGNOSIS — I420 Dilated cardiomyopathy: Secondary | ICD-10-CM | POA: Diagnosis not present

## 2020-03-10 DIAGNOSIS — I495 Sick sinus syndrome: Secondary | ICD-10-CM | POA: Diagnosis not present

## 2020-03-10 DIAGNOSIS — M810 Age-related osteoporosis without current pathological fracture: Secondary | ICD-10-CM | POA: Diagnosis not present

## 2020-03-10 DIAGNOSIS — Z0000 Encounter for general adult medical examination without abnormal findings: Secondary | ICD-10-CM | POA: Diagnosis not present

## 2020-03-10 DIAGNOSIS — E538 Deficiency of other specified B group vitamins: Secondary | ICD-10-CM | POA: Diagnosis not present

## 2020-03-10 DIAGNOSIS — C9110 Chronic lymphocytic leukemia of B-cell type not having achieved remission: Secondary | ICD-10-CM | POA: Diagnosis not present

## 2020-03-10 LAB — CUP PACEART REMOTE DEVICE CHECK
Battery Remaining Longevity: 68 mo
Battery Remaining Percentage: 95.5 %
Battery Voltage: 2.96 V
Brady Statistic AP VP Percent: 86 %
Brady Statistic AP VS Percent: 1 %
Brady Statistic AS VP Percent: 11 %
Brady Statistic AS VS Percent: 2.5 %
Brady Statistic RA Percent Paced: 86 %
Date Time Interrogation Session: 20220112020019
Implantable Lead Implant Date: 20190110
Implantable Lead Implant Date: 20190110
Implantable Lead Implant Date: 20190110
Implantable Lead Location: 753858
Implantable Lead Location: 753859
Implantable Lead Location: 753860
Implantable Pulse Generator Implant Date: 20190110
Lead Channel Impedance Value: 390 Ohm
Lead Channel Impedance Value: 600 Ohm
Lead Channel Impedance Value: 740 Ohm
Lead Channel Pacing Threshold Amplitude: 0.5 V
Lead Channel Pacing Threshold Amplitude: 0.75 V
Lead Channel Pacing Threshold Amplitude: 1.75 V
Lead Channel Pacing Threshold Pulse Width: 0.5 ms
Lead Channel Pacing Threshold Pulse Width: 0.5 ms
Lead Channel Pacing Threshold Pulse Width: 1.5 ms
Lead Channel Sensing Intrinsic Amplitude: 12 mV
Lead Channel Sensing Intrinsic Amplitude: 3.5 mV
Lead Channel Setting Pacing Amplitude: 2 V
Lead Channel Setting Pacing Amplitude: 2.5 V
Lead Channel Setting Pacing Amplitude: 2.5 V
Lead Channel Setting Pacing Pulse Width: 0.5 ms
Lead Channel Setting Pacing Pulse Width: 1.5 ms
Lead Channel Setting Sensing Sensitivity: 2 mV
Pulse Gen Model: 3262
Pulse Gen Serial Number: 8942222

## 2020-03-20 NOTE — Progress Notes (Deleted)
Cardiology Office Note Date:  03/20/2020  Patient ID:  Christopher, Daniels 01-23-1935, MRN [id number] PCP:  Rusty Aus, MD  Cardiologist/Electrophysiologist: Dr. Caryl Comes  ***refresh   Chief Complaint: *** annual visit  History of Present Illness: Christopher Daniels is a 86 y.o. male with history of CAD (s/p inferior MI with stenting of RCA December 2017 and staged DES to LAD), LBBB, ICM, chronic CHF (systolic), bradycardia Mobitz II bock, w/CRT-P, orthostatic hypotension, GERD, iron def anemia.  He comes in today to be seen for Dr. Caryl Comes.  He saw him last Jan 2021, note mentions low voltage and In the context of his orthostasis and his conduction system disease it raises the possibility of amyloid.  His MRI 2018 was not suggestive.  Will defer to Dr. Sabra Heck.  Most recently saw Christopher Montana, NP Feb 2021, for an episode of CP, felt to have been most c/w his GERD, and atypical, no changes were made, planned for annual visit.  *** symptoms *** meds, CM, CAD *** volume *** labs, lipids *** ??? Device rep assist  Device information SJM CRT-P implanted 03/06/2017   Past Medical History:  Diagnosis Date  . Arthritis    "joints; fingers, knees" (03/06/2017)  . CAD (coronary artery disease)    a. 01/2016 Inf STEMI->PCI/Thrombectomy/DES to mid RCA (4.0 x 30 Resolute DES), EF 25-30%; b. 01/2016 Staged PCI/DES to mLAD (3.0x22 Resolute DES); c. 10/2016 Cath: LM nl, LAD patent stent, 30d, D2 70, LCX nl, OM1/2 nl, RCA patent mid/dist stent.  . CLL (chronic lymphocytic leukemia) ([place])    "never taken any RX; just monitor this twice/year" (03/06/2017)  . Diverticulitis   . GERD (gastroesophageal reflux disease)   . H/O Bell's palsy 1960s  . HFrEF (heart failure with reduced ejection fraction) ([place])   . High cholesterol   . History of kidney stones   . HOH (hard of hearing)   . Iron deficiency anemia   . Ischemic cardiomyopathy    a. 01/2016 EF 25-30%; b. 04/2016 f/u echo: EF 36%, mild glob  HK, basal and mid inflat, inf, infsept, mid apical inf AK, mildly dil LA/RA, mild AI/MR, trace TR; c. 06/2016 Cardiac MRI: EF 36%, apical AK, mod diff HK, septal-lateral dyssynchrony  . LBBB (left bundle branch block)   . Mobitz type 2 second degree AV block 03/06/2017  . Myocardial infarction ([place]) 01/27/2016  . Presence of permanent cardiac pacemaker   . Skin cancer    a. on head years ago  . Tachy-brady syndrome ([place])    a. 02/2017 s/p SJM 3262 Quadra Allur MP CRT-P.    Past Surgical History:  Procedure Laterality Date  . BACK SURGERY    . BI-VENTRICULAR PACEMAKER INSERTION (CRT-P)  03/06/2017  . BIV PACEMAKER INSERTION CRT-P N/A 03/06/2017   Procedure: BIV PACEMAKER INSERTION CRT-P;  Surgeon: Evans Lance, MD;  Location: [place] [facility];  Service: Cardiovascular;  Laterality: N/A;  . CARDIAC CATHETERIZATION N/A 07/29/2014   Procedure: Left Heart Cath;  Surgeon: Teodoro Spray, MD;  Location: [place] [facility];  Service: Cardiovascular;  Laterality: N/A;  . CARDIAC CATHETERIZATION N/A 01/27/2016   Procedure: Left Heart Cath and Coronary Angiography;  Surgeon: [name], MD;  Location: [place] [facility];  Service: Cardiovascular;  Laterality: N/A;  . CARDIAC CATHETERIZATION N/A 01/27/2016   Procedure: Coronary Stent Intervention;  Surgeon: [name], MD;  Location: [place] [facility];  Service: Cardiovascular;  Laterality: N/A;  . CARDIAC CATHETERIZATION N/A 02/13/2016  Procedure: Coronary Stent Intervention;  Surgeon: [name], MD;  Location: [place] [facility];  Service: Cardiovascular;  Laterality: N/A;  . CARDIAC CATHETERIZATION N/A 02/13/2016   Procedure: Left Heart Cath and Coronary Angiography;  Surgeon: [name], MD;  Location: [place] [facility];  Service: Cardiovascular;  Laterality: N/A;  . CARDIAC CATHETERIZATION    . CATARACT EXTRACTION W/PHACO Right 07/18/2014   Procedure: CATARACT EXTRACTION PHACO AND INTRAOCULAR LENS PLACEMENT (IOC);  Surgeon: Estill Cotta,  MD;  Location: [facility];  Service: Ophthalmology;  Laterality: Right;  [place] 03:11 AP% 28.4 CDE 75.64  . CATARACT EXTRACTION W/PHACO Left 06/19/2015   Procedure: CATARACT EXTRACTION PHACO AND INTRAOCULAR LENS PLACEMENT (IOC);  Surgeon: Estill Cotta, MD;  Location: [facility];  Service: Ophthalmology;  Laterality: Left;  [place] 02:36.5 AP% 27.7 CDE 69.51 fluid pack lot # [id number]  . COLONOSCOPY WITH PROPOFOL N/A 04/20/2018   Procedure: COLONOSCOPY WITH PROPOFOL;  Surgeon: Manya Silvas, MD;  Location: [facility];  Service: Endoscopy;  Laterality: N/A;  . ESOPHAGOGASTRODUODENOSCOPY (EGD) WITH PROPOFOL N/A 04/20/2018   Procedure: ESOPHAGOGASTRODUODENOSCOPY (EGD) WITH PROPOFOL;  Surgeon: Manya Silvas, MD;  Location: [facility] ENDOSCOPY;  Service: Endoscopy;  Laterality: N/A;  . EYE SURGERY    . INGUINAL HERNIA REPAIR Left 2010  . INSERT / REPLACE / REMOVE PACEMAKER    . LEFT HEART CATH AND CORONARY ANGIOGRAPHY N/A 11/18/2016   Procedure: LEFT HEART CATH AND CORONARY ANGIOGRAPHY;  Surgeon: Wellington Hampshire, MD;  Location: [place] [facility];  Service: Cardiovascular;  Laterality: N/A;  . LUMBAR [place]   "ruptured disc; took a piece off my buttocks and fixed it"  . SKIN CANCER EXCISION     "top of my head"  . TONSILLECTOMY      Current Outpatient Medications  Medication Sig Dispense Refill  . aspirin 81 MG tablet Take 81 mg by mouth daily.     Marland Kitchen atorvastatin (LIPITOR) 80 MG tablet Take 1 tablet (80 mg total) by mouth every evening. 90 tablet 3  . Calcium Carbonate-Vit D-Min (CALTRATE PLUS PO) Take by mouth 2 (two) times daily. Taking tablets  2 daily    . carboxymethylcellulose (REFRESH TEARS) 0.5 % SOLN Place 1 drop into both eyes 3 (three) times daily as needed (for dry eyes).     . carvedilol (COREG) 3.125 MG tablet TAKE 1 TABLET BY MOUTH TWICE DAILY 180 tablet 1  . co-enzyme Q-10 30 MG capsule Take 30 mg by mouth daily.    . Cyanocobalamin (B-12) 500 MCG SUBL Place 500  mcg under the tongue once a week.     . ferrous gluconate (FERGON) 324 MG tablet Take 1 tablet by mouth 2 (two) times daily.    Marland Kitchen lisinopril (PRINIVIL,ZESTRIL) 5 MG tablet Take 2.5 mg by mouth daily.    . nitroGLYCERIN (NITROSTAT) 0.4 MG SL tablet Place 1 tablet (0.4 mg total) under the tongue every 5 (five) minutes x 3 doses as needed for chest pain. (Patient not taking: Reported on 07/30/2019) 25 tablet 3  . pantoprazole (PROTONIX) 40 MG tablet Take 40 mg by mouth daily. Taking 1 tablet daily    . spironolactone (ALDACTONE) 25 MG tablet TAKE 1/2 TABLET ONCE A DAY 45 tablet 2  . ticagrelor (BRILINTA) 60 MG TABS tablet Take 1 tablet (60 mg total) by mouth 2 (two) times daily. 180 tablet 3  . traMADol (ULTRAM) 50 MG tablet Take 50 mg by mouth 2 (two) times daily as needed.  No current facility-administered medications for this visit.    Allergies:   Niaspan [niacin er] and Adhesive [tape]   Social History:  The patient  reports that he quit smoking about 62 years ago. His smoking use included pipe and cigars. He quit after 4.00 years of use. He quit smokeless tobacco use about 62 years ago.  His smokeless tobacco use included chew. He reports that he does not drink alcohol and does not use drugs.   Family History:  The patient's family history includes Heart failure in his father.  ROS:  Please see the history of present illness.    All other systems are reviewed and otherwise negative.   PHYSICAL EXAM:  VS:  There were no vitals taken for this visit. BMI: There is no height or weight on file to calculate BMI. Well nourished, well developed, in no acute distress HEENT: normocephalic, atraumatic Neck: no JVD, carotid bruits or masses Cardiac:  *** RRR; no significant murmurs, no rubs, or gallops Lungs:  *** CTA b/l, no wheezing, rhonchi or rales Abd: soft, nontender MS: no deformity or *** atrophy Ext: *** no edema Skin: warm and dry, no rash Neuro:  No gross deficits  appreciated Psych: euthymic mood, full affect  *** ICD site is stable, no tethering or discomfort   EKG:  Done today and reviewed by myself shows  ***  Device interrogation done today and reviewed by myself:  ***  11/18/2016: LHC  Mid RCA to Dist RCA lesion, 0 %stenosed.  Mid LAD lesion, 0 %stenosed.  Ost 2nd Diag to 2nd Diag lesion, 70 %stenosed.  Mid LAD to Dist LAD lesion, 30 %stenosed.   1. Significant underlying 2 vessel coronary artery disease with widely patent stents in the right coronary artery and mid LAD. Second diagonal is jailed by the LAD stent with 70% ostial stenosis. 2. Mildly elevated left ventricular end-diastolic pressure. Left ventricular angiography was not performed.  Recommendations: Continue medical therapy for coronary artery disease. The patient's anemia might be contributing to some of his symptoms.   07/01/2016: c.MRI IMPRESSION: 1. Mildly dilated LV with EF 36%. Akinetic apex with diffuse moderate hypokinesis in the remainder of the ventricle. Septal-lateral dyssynchrony.  2. Moderately dilated right ventricle with mildly decreased systolic function. The RV apex appears akinetic.  3.  Probably moderate mitral regurgitation.  4. Significant LGE noted in the apical septal wall and the true apex. These territories are likely nonviable.   05/15/2016: TTE ([facility]) LVEF 25-30%, mild conc LVH Grade II DD Mild global hypokinesis LA 38mm Mild RV dilation, normal RVEF Mild AI, MR, trace TR  Recent Labs: 02/01/2020: Hemoglobin 12.2; Platelets 249  No results found for requested labs within last 8760 hours.   CrCl cannot be calculated (Patient's most recent lab result is older than the maximum 21 days allowed.).   Wt Readings from Last 3 Encounters:  08/02/19 185 lb 3.2 oz (84 kg)  04/23/19 185 lb 12 oz (84.3 kg)  03/16/19 188 lb 12 oz (85.6 kg)     Other studies reviewed: Additional studies/records reviewed today  include: summarized above  ASSESSMENT AND PLAN:  1. CRT-P     ***  2. CAD     ***  3. ICM 4. Chronic CHF (systolic) 5. LBBB      ***  6. Orthostatic hypotension     ***  Disposition: F/u with ***  Current medicines are reviewed at length with the patient today.  The patient did not have any concerns  regarding medicines.  Venetia Night, PA-C 03/20/2020 10:55 AM     [facility] [address] [place] [address] [phone] (office)  [phone] (fax)

## 2020-03-22 NOTE — Progress Notes (Signed)
Remote pacemaker transmission.   

## 2020-03-23 ENCOUNTER — Encounter: Payer: PPO | Admitting: Physician Assistant

## 2020-03-27 NOTE — Progress Notes (Unsigned)
Patient Care Team: Rusty Aus, MD as PCP - General (Internal Medicine) Deboraha Sprang, MD as PCP - Cardiology (Cardiology) Lloyd Huger, MD as Consulting Physician (Oncology)   HPI  Christopher Daniels is a 86 y.o. male seen in followup of sinus bradycardia; left ventricular dysfunction in the setting of a recent MI. He also has LBBB;  he is status post CRT-P implantation (GT) 2019 when he presented with higher grade heart block.  He has a history of ischemic heart disease.  12/17 STEMI    Seeing today with marked variation , transient decrease in CRT    DATE TEST EF   12/17 LHC 25-30% RCA  Stent 100% >> 0  LAD Stent 80% >>0  5/18 cMRI   36 % + LGE apical septum--normal LV thickness  5/18 Echo   30-35 % LVH 1.3 cm  9/18 LHC  Patent Stents       Date Cr K LDL Hgb  4/18  0.79 4.5     6/18  1.0 4.5  13.7  1/19 0.9 3.8    10/19 1.04 4.3  10.9 (11.2-12/19)  12/20 1.1 4.6  12.3  1/22 1.2 4.3 47 12.3  ,The patient denies chest pain , nocturnal dyspne, orthopnea or peripheral edema.  There have been no palpitations, lightheadedness or syncope. Some chronic dyspnea on exertion manifested mostly with concomitant leg fatigue.  Was able to walk in from the parking lot today without difficulty.  N  He has been diagnosed with iron deficiency anemia.  Dr. Gary Fleet notes were reviewed.  3/20 saturation was 6%.  Now normal.        Past Medical History:  Diagnosis Date  . Arthritis    "joints; fingers, knees" (03/06/2017)  . CAD (coronary artery disease)    a. 01/2016 Inf STEMI->PCI/Thrombectomy/DES to mid RCA (4.0 x 30 Resolute DES), EF 25-30%; b. 01/2016 Staged PCI/DES to mLAD (3.0x22 Resolute DES); c. 10/2016 Cath: LM nl, LAD patent stent, 30d, D2 70, LCX nl, OM1/2 nl, RCA patent mid/dist stent.  . CLL (chronic lymphocytic leukemia) ([place])    "never taken any RX; just monitor this twice/year" (03/06/2017)  . Diverticulitis   . GERD (gastroesophageal reflux disease)   .  H/O Bell's palsy 1960s  . HFrEF (heart failure with reduced ejection fraction) ([place])   . High cholesterol   . History of kidney stones   . HOH (hard of hearing)   . Iron deficiency anemia   . Ischemic cardiomyopathy    a. 01/2016 EF 25-30%; b. 04/2016 f/u echo: EF 36%, mild glob HK, basal and mid inflat, inf, infsept, mid apical inf AK, mildly dil LA/RA, mild AI/MR, trace TR; c. 06/2016 Cardiac MRI: EF 36%, apical AK, mod diff HK, septal-lateral dyssynchrony  . LBBB (left bundle branch block)   . Mobitz type 2 second degree AV block 03/06/2017  . Myocardial infarction ([place]) 01/27/2016  . Presence of permanent cardiac pacemaker   . Skin cancer    a. on head years ago  . Tachy-brady syndrome ([place])    a. 02/2017 s/p SJM 3262 Quadra Allur MP CRT-P.    Past Surgical History:  Procedure Laterality Date  . BACK SURGERY    . BI-VENTRICULAR PACEMAKER INSERTION (CRT-P)  03/06/2017  . BIV PACEMAKER INSERTION CRT-P N/A 03/06/2017   Procedure: BIV PACEMAKER INSERTION CRT-P;  Surgeon: Evans Lance, MD;  Location: [place] [facility];  Service: Cardiovascular;  Laterality: N/A;  . CARDIAC CATHETERIZATION  N/A 07/29/2014   Procedure: Left Heart Cath;  Surgeon: Teodoro Spray, MD;  Location: [place] [facility];  Service: Cardiovascular;  Laterality: N/A;  . CARDIAC CATHETERIZATION N/A 01/27/2016   Procedure: Left Heart Cath and Coronary Angiography;  Surgeon: [name], MD;  Location: [place] [facility];  Service: Cardiovascular;  Laterality: N/A;  . CARDIAC CATHETERIZATION N/A 01/27/2016   Procedure: Coronary Stent Intervention;  Surgeon: [name], MD;  Location: [place] [facility];  Service: Cardiovascular;  Laterality: N/A;  . CARDIAC CATHETERIZATION N/A 02/13/2016   Procedure: Coronary Stent Intervention;  Surgeon: [name], MD;  Location: [place] [facility];  Service: Cardiovascular;  Laterality: N/A;  . CARDIAC CATHETERIZATION N/A 02/13/2016   Procedure: Left Heart Cath and Coronary Angiography;   Surgeon: [name], MD;  Location: [place] [facility];  Service: Cardiovascular;  Laterality: N/A;  . CARDIAC CATHETERIZATION    . CATARACT EXTRACTION W/PHACO Right 07/18/2014   Procedure: CATARACT EXTRACTION PHACO AND INTRAOCULAR LENS PLACEMENT (IOC);  Surgeon: Estill Cotta, MD;  Location: [facility];  Service: Ophthalmology;  Laterality: Right;  [place] 03:11 AP% 28.4 CDE 75.64  . CATARACT EXTRACTION W/PHACO Left 06/19/2015   Procedure: CATARACT EXTRACTION PHACO AND INTRAOCULAR LENS PLACEMENT (IOC);  Surgeon: Estill Cotta, MD;  Location: [facility];  Service: Ophthalmology;  Laterality: Left;  [place] 02:36.5 AP% 27.7 CDE 69.51 fluid pack lot # [id number]  . COLONOSCOPY WITH PROPOFOL N/A 04/20/2018   Procedure: COLONOSCOPY WITH PROPOFOL;  Surgeon: Manya Silvas, MD;  Location: [facility]COPY;  Service: Endoscopy;  Laterality: N/A;  . ESOPHAGOGASTRODUODENOSCOPY (EGD) WITH PROPOFOL N/A 04/20/2018   Procedure: ESOPHAGOGASTRODUODENOSCOPY (EGD) WITH PROPOFOL;  Surgeon: Manya Silvas, MD;  Location: [facility] ENDOSCOPY;  Service: Endoscopy;  Laterality: N/A;  . EYE SURGERY    . INGUINAL HERNIA REPAIR Left 2010  . INSERT / REPLACE / REMOVE PACEMAKER    . LEFT HEART CATH AND CORONARY ANGIOGRAPHY N/A 11/18/2016   Procedure: LEFT HEART CATH AND CORONARY ANGIOGRAPHY;  Surgeon: Wellington Hampshire, MD;  Location: [place] [facility];  Service: Cardiovascular;  Laterality: N/A;  . LUMBAR [place]   "ruptured disc; took a piece off my buttocks and fixed it"  . SKIN CANCER EXCISION     "top of my head"  . TONSILLECTOMY      Current Outpatient Medications  Medication Sig Dispense Refill  . aspirin 81 MG tablet Take 81 mg by mouth daily.     Marland Kitchen atorvastatin (LIPITOR) 80 MG tablet Take 1 tablet (80 mg total) by mouth every evening. 90 tablet 3  . Calcium Carbonate-Vit D-Min (CALTRATE PLUS PO) Take by mouth 2 (two) times daily. Taking tablets  2 daily    . carboxymethylcellulose (REFRESH PLUS) 0.5 %  SOLN Place 1 drop into both eyes 3 (three) times daily as needed (for dry eyes).     . carvedilol (COREG) 3.125 MG tablet TAKE 1 TABLET BY MOUTH TWICE DAILY 180 tablet 1  . co-enzyme Q-10 30 MG capsule Take 30 mg by mouth daily.    . Cyanocobalamin (B-12) 500 MCG SUBL Place 500 mcg under the tongue once a week.     . ferrous gluconate (FERGON) 324 MG tablet Takes 1 tablet PO twice per week    . lisinopril (PRINIVIL,ZESTRIL) 5 MG tablet Take 2.5 mg by mouth daily.    . nitroGLYCERIN (NITROSTAT) 0.4 MG SL tablet Place 1 tablet (0.4 mg total) under the tongue every 5 (five) minutes x 3 doses  as needed for chest pain. 25 tablet 3  . pantoprazole (PROTONIX) 40 MG tablet Take 40 mg by mouth daily. Taking 1 tablet daily    . spironolactone (ALDACTONE) 25 MG tablet TAKE 1/2 TABLET ONCE A DAY 45 tablet 2  . ticagrelor (BRILINTA) 60 MG TABS tablet Take 1 tablet (60 mg total) by mouth 2 (two) times daily. 180 tablet 3  . traMADol (ULTRAM) 50 MG tablet Take 50 mg by mouth 2 (two) times daily as needed.     No current facility-administered medications for this visit.    Allergies  Allergen Reactions  . Niaspan [Niacin Er] Anaphylaxis and Other (See Comments)    Episode happened 10 to 15 years ago.  Has not tried any other similar meds since  . Adhesive [Tape] Other (See Comments)    Skin is very sensitive; PLEASE USE PAPER TAPE!!         Social History  . Marital status: Married    Spouse name: N/A  . Number of children: N/A  . Years of education: N/A   Occupational History  . Not on file.   Social History Main Topics  . Smoking status: Former Smoker    Years: 4.00    Types: Cigars  . Smokeless tobacco: Never Used  . Alcohol use No  . Drug use: No  . Sexual activity: Not on file   Other Topics Concern  . Not on file     Family History  Problem Relation Age of Onset  . Heart failure Father      ROS:  Please see the history of present illness.     All other systems reviewed  and negative.    BP 110/60 (BP Location: Left Arm, Patient Position: Sitting, Cuff Size: Normal)   Pulse 60   Ht 5\' 10"  (1.778 m)   Wt 184 lb (83.5 kg)   BMI 26.40 kg/m  Well developed and well nourished in no acute distress HENT normal Neck supple with JVP-flat Clear Device pocket well healed; without hematoma or erythema.  There is no tethering  Regular rate and rhythm, no murmur Abd-soft with active BS No Clubbing cyanosis   edema Skin-warm and dry A & Oriented  Grossly normal sensory and motor function  ECG AV pacing u w upright QRS V1     Assessment and Plan:  Sinus bradycardia    High-grade heart block intermittent  Ischemic cardiomyopathy  status post DES 2   12/17  EF 36% MRI 5/18   Congestive heart failure class II  Left bundle branch block  CRT-pacemaker St Jude   Hyperlipidemia  Orthostatic hypotension  Low voltage   Transient significant decrease in both atrial and CRT pacing in the context of his wife and daughter both been ill.  Suspect increase anterograde conduction.  Numbers are back to baseline.  Hence, we will make no acute adjustments.  It has been 3+ years since we last echo.  In the event that he has persistent LV dysfunction, medication adjustments would be appropriate, considering Entresto, potentially an SGLT2 etc.  We will assess LV function  LDL is in range.  I have reached out to interventional cardiology given his DAPT; would hope that we could consolidate that and reduce his bleeding risk.  Reviewed the case with Dr. Saunders Revel.  Thinks it is reasonable to eliminate aspirin.  Would favor ticagrelor 60 twice daily if cost affordable otherwise clopidogrel 75 daily.  We will review with the patient     Current  medicines are reviewed at length with the patient today .  The patient does not   have concerns regarding medicines.

## 2020-03-28 ENCOUNTER — Ambulatory Visit: Payer: PPO | Admitting: Internal Medicine

## 2020-03-28 ENCOUNTER — Other Ambulatory Visit: Payer: Self-pay

## 2020-03-28 ENCOUNTER — Encounter: Payer: Self-pay | Admitting: Internal Medicine

## 2020-03-28 VITALS — BP 110/60 | HR 60 | Ht 70.0 in | Wt 184.0 lb

## 2020-03-28 DIAGNOSIS — I495 Sick sinus syndrome: Secondary | ICD-10-CM | POA: Diagnosis not present

## 2020-03-28 DIAGNOSIS — Z950 Presence of cardiac pacemaker: Secondary | ICD-10-CM

## 2020-03-28 DIAGNOSIS — I441 Atrioventricular block, second degree: Secondary | ICD-10-CM | POA: Diagnosis not present

## 2020-03-28 DIAGNOSIS — I5022 Chronic systolic (congestive) heart failure: Secondary | ICD-10-CM | POA: Diagnosis not present

## 2020-03-28 LAB — CUP PACEART INCLINIC DEVICE CHECK
Battery Remaining Longevity: 60 mo
Battery Voltage: 2.95 V
Brady Statistic RA Percent Paced: 86 %
Brady Statistic RV Percent Paced: 97 %
Date Time Interrogation Session: 20220201092932
Implantable Lead Implant Date: 20190110
Implantable Lead Implant Date: 20190110
Implantable Lead Implant Date: 20190110
Implantable Lead Location: 753858
Implantable Lead Location: 753859
Implantable Lead Location: 753860
Implantable Pulse Generator Implant Date: 20190110
Lead Channel Impedance Value: 400 Ohm
Lead Channel Impedance Value: 612.5 Ohm
Lead Channel Impedance Value: 787.5 Ohm
Lead Channel Pacing Threshold Amplitude: 0.5 V
Lead Channel Pacing Threshold Amplitude: 0.75 V
Lead Channel Pacing Threshold Amplitude: 1.75 V
Lead Channel Pacing Threshold Pulse Width: 0.5 ms
Lead Channel Pacing Threshold Pulse Width: 0.5 ms
Lead Channel Pacing Threshold Pulse Width: 1.5 ms
Lead Channel Sensing Intrinsic Amplitude: 12 mV
Lead Channel Sensing Intrinsic Amplitude: 4.2 mV
Lead Channel Setting Pacing Amplitude: 2 V
Lead Channel Setting Pacing Amplitude: 2.5 V
Lead Channel Setting Pacing Amplitude: 2.5 V
Lead Channel Setting Pacing Pulse Width: 0.5 ms
Lead Channel Setting Pacing Pulse Width: 1.5 ms
Lead Channel Setting Sensing Sensitivity: 2 mV
Pulse Gen Model: 3262
Pulse Gen Serial Number: 8942222

## 2020-03-28 LAB — PACEMAKER DEVICE OBSERVATION

## 2020-03-28 NOTE — Patient Instructions (Addendum)
Medication Instructions:  - Your physician recommends that you continue on your current medications as directed. Please refer to the Current Medication list given to you today.  - We will let you know what Dr. Caryl Comes finds out regarding your aspirin & Brilinta and if you may stop one of those medications.   *If you need a refill on your cardiac medications before your next appointment, please call your pharmacy*   Lab Work: - none ordered  If you have labs (blood work) drawn today and your tests are completely normal, you will receive your results only by: Marland Kitchen MyChart Message (if you have MyChart) OR . A paper copy in the mail If you have any lab test that is abnormal or we need to change your treatment, we will call you to review the results.   Testing/Procedures: - Your physician has requested that you have an echocardiogram. Echocardiography is a painless test that uses sound waves to create images of your heart. It provides your doctor with information about the size and shape of your heart and how well your heart's chambers and valves are working. This procedure takes approximately one hour. There are no restrictions for this procedure. There is a possibility that an IV may need to be started during your test to inject an image enhancing agent. This is done to obtain more optimal pictures of your heart. Therefore we ask that you do at least drink some water prior to coming in to hydrate your veins.    Follow-Up: At [facility], you and your health needs are our priority.  As part of our continuing mission to provide you with exceptional heart care, we have created designated Provider Care Teams.  These Care Teams include your primary Cardiologist (physician) and Advanced Practice Providers (APPs -  Physician Assistants and Nurse Practitioners) who all work together to provide you with the care you need, when you need it.  We recommend signing up for the patient portal called "MyChart".   Sign up information is provided on this After Visit Summary.  MyChart is used to connect with patients for Virtual Visits (Telemedicine).  Patients are able to view lab/test results, encounter notes, upcoming appointments, etc.  Non-urgent messages can be sent to your provider as well.   To learn more about what you can do with MyChart, go to [email or web address].    Your next appointment:   1 year(s)  The format for your next appointment:   In Person  Provider:   Virl Axe, MD   Other Instructions n/a

## 2020-03-31 ENCOUNTER — Telehealth: Payer: Self-pay | Admitting: Internal Medicine

## 2020-03-31 NOTE — Telephone Encounter (Signed)
The patient was seen by Dr. Caryl Comes on 03/28/20. Per Dr. Caryl Comes, he was going to reach out to interventional cardiology about the patient's ASA/ Brilinta and if both would need to be continued at this point.  I have reviewed Dr. Olin Pia completed note.  Per Dr. Caryl Comes:  I have reached out to interventional cardiology given his DAPT; would hope that we could consolidate that and reduce his bleeding risk.  Reviewed the case with Dr. Saunders Revel.  Thinks it is reasonable to eliminate aspirin.  Would favor ticagrelor 60 twice daily if cost affordable otherwise clopidogrel 75 daily.  We will review with the patient

## 2020-03-31 NOTE — Telephone Encounter (Signed)
I will reach out to the patient next week regarding MD recommendations.

## 2020-04-04 ENCOUNTER — Other Ambulatory Visit: Payer: PPO

## 2020-04-05 DIAGNOSIS — H16223 Keratoconjunctivitis sicca, not specified as Sjogren's, bilateral: Secondary | ICD-10-CM | POA: Diagnosis not present

## 2020-04-06 DIAGNOSIS — M5116 Intervertebral disc disorders with radiculopathy, lumbar region: Secondary | ICD-10-CM | POA: Diagnosis not present

## 2020-04-06 NOTE — Telephone Encounter (Signed)
I called and spoke with the patient to relay recommendations from Dr. Saunders Revel regarding his DAPT. The patient is aware he may stop aspirin and continue brilinta 60 mg BID if this affordable for him.  Per the patient, he is ok to stop ASA. He is not having any issues with the Brilinta 60 mg BID and is willing to continue this.  The patient advised he will go ahead and stop his ASA and stay on Brilinta 60 mg BID.

## 2020-04-11 ENCOUNTER — Other Ambulatory Visit: Payer: Self-pay

## 2020-04-11 ENCOUNTER — Ambulatory Visit (INDEPENDENT_AMBULATORY_CARE_PROVIDER_SITE_OTHER): Payer: PPO

## 2020-04-11 DIAGNOSIS — I5022 Chronic systolic (congestive) heart failure: Secondary | ICD-10-CM | POA: Diagnosis not present

## 2020-04-12 LAB — ECHOCARDIOGRAM COMPLETE
AR max vel: 2.38 cm2
AV Area VTI: 2.59 cm2
AV Area mean vel: 2.45 cm2
AV Mean grad: 4 mmHg
AV Peak grad: 6.7 mmHg
Ao pk vel: 1.29 m/s
Area-P 1/2: 2.62 cm2
Calc EF: 43.2 %
P 1/2 time: 758 msec
S' Lateral: 3.4 cm
Single Plane A2C EF: 45.6 %
Single Plane A4C EF: 43.1 %

## 2020-04-19 DIAGNOSIS — K5730 Diverticulosis of large intestine without perforation or abscess without bleeding: Secondary | ICD-10-CM | POA: Diagnosis not present

## 2020-04-19 DIAGNOSIS — K219 Gastro-esophageal reflux disease without esophagitis: Secondary | ICD-10-CM | POA: Diagnosis not present

## 2020-04-19 DIAGNOSIS — C9110 Chronic lymphocytic leukemia of B-cell type not having achieved remission: Secondary | ICD-10-CM | POA: Diagnosis not present

## 2020-04-19 DIAGNOSIS — D500 Iron deficiency anemia secondary to blood loss (chronic): Secondary | ICD-10-CM | POA: Diagnosis not present

## 2020-04-19 DIAGNOSIS — K449 Diaphragmatic hernia without obstruction or gangrene: Secondary | ICD-10-CM | POA: Diagnosis not present

## 2020-05-01 ENCOUNTER — Other Ambulatory Visit: Payer: Self-pay | Admitting: Internal Medicine

## 2020-05-01 ENCOUNTER — Ambulatory Visit
Admission: RE | Admit: 2020-05-01 | Discharge: 2020-05-01 | Disposition: A | Discharge status: Home / Self Care | Payer: PPO | Source: Ambulatory Visit | Attending: Internal Medicine | Admitting: Internal Medicine

## 2020-05-01 ENCOUNTER — Other Ambulatory Visit (HOSPITAL_COMMUNITY): Payer: Self-pay | Admitting: Internal Medicine

## 2020-05-01 ENCOUNTER — Other Ambulatory Visit: Payer: Self-pay

## 2020-05-01 DIAGNOSIS — R059 Cough, unspecified: Secondary | ICD-10-CM | POA: Diagnosis not present

## 2020-05-01 DIAGNOSIS — K5730 Diverticulosis of large intestine without perforation or abscess without bleeding: Secondary | ICD-10-CM | POA: Diagnosis not present

## 2020-05-01 DIAGNOSIS — R1084 Generalized abdominal pain: Secondary | ICD-10-CM | POA: Diagnosis not present

## 2020-05-01 DIAGNOSIS — R634 Abnormal weight loss: Secondary | ICD-10-CM | POA: Diagnosis not present

## 2020-05-01 DIAGNOSIS — R053 Chronic cough: Secondary | ICD-10-CM | POA: Diagnosis not present

## 2020-05-01 DIAGNOSIS — K8020 Calculus of gallbladder without cholecystitis without obstruction: Secondary | ICD-10-CM | POA: Diagnosis not present

## 2020-05-01 LAB — POCT I-STAT CREATININE: Creatinine, Ser: 1.1 mg/dL (ref 0.61–1.24)

## 2020-05-01 MED ORDER — IOHEXOL 300 MG/ML  SOLN
100.0000 mL | Freq: Once | INTRAMUSCULAR | Status: AC | PRN
  Administered 2020-05-01: 100 mL via INTRAVENOUS

## 2020-05-03 DIAGNOSIS — D72820 Lymphocytosis (symptomatic): Secondary | ICD-10-CM | POA: Diagnosis not present

## 2020-05-19 DIAGNOSIS — M21622 Bunionette of left foot: Secondary | ICD-10-CM | POA: Diagnosis not present

## 2020-05-19 DIAGNOSIS — D2372 Other benign neoplasm of skin of left lower limb, including hip: Secondary | ICD-10-CM | POA: Diagnosis not present

## 2020-05-19 DIAGNOSIS — Q828 Other specified congenital malformations of skin: Secondary | ICD-10-CM | POA: Diagnosis not present

## 2020-05-19 DIAGNOSIS — L84 Corns and callosities: Secondary | ICD-10-CM | POA: Diagnosis not present

## 2020-05-19 DIAGNOSIS — L851 Acquired keratosis [keratoderma] palmaris et plantaris: Secondary | ICD-10-CM | POA: Diagnosis not present

## 2020-05-19 DIAGNOSIS — M79672 Pain in left foot: Secondary | ICD-10-CM | POA: Diagnosis not present

## 2020-05-30 ENCOUNTER — Encounter: Payer: PPO | Admitting: Internal Medicine

## 2020-06-01 ENCOUNTER — Telehealth: Payer: Self-pay | Admitting: Internal Medicine

## 2020-06-01 NOTE — Telephone Encounter (Signed)
Patient missed a call from another provider office to discuss recent imaging results and some treatment that could be helpful.     Patient was outside at the time so this conversation was had with his spouse .    Patient has not received a call back per request to personally discuss and he has no idea what it was about.    Patient states Dr. Aquilla Hacker nurse is very nice and he would like to discuss with her to see if she can help.

## 2020-06-02 DIAGNOSIS — M79672 Pain in left foot: Secondary | ICD-10-CM | POA: Diagnosis not present

## 2020-06-02 DIAGNOSIS — D2372 Other benign neoplasm of skin of left lower limb, including hip: Secondary | ICD-10-CM | POA: Diagnosis not present

## 2020-06-02 DIAGNOSIS — Q828 Other specified congenital malformations of skin: Secondary | ICD-10-CM | POA: Diagnosis not present

## 2020-06-02 DIAGNOSIS — L84 Corns and callosities: Secondary | ICD-10-CM | POA: Diagnosis not present

## 2020-06-02 DIAGNOSIS — L851 Acquired keratosis [keratoderma] palmaris et plantaris: Secondary | ICD-10-CM | POA: Diagnosis not present

## 2020-06-02 DIAGNOSIS — M21622 Bunionette of left foot: Secondary | ICD-10-CM | POA: Diagnosis not present

## 2020-06-02 NOTE — Telephone Encounter (Signed)
Dr. Caryl Comes was aware the patient had called back and was going to try to call him yesterday afternoon.  To Dr. Caryl Comes to confirm if he spoke with the patient/ recommendations.

## 2020-06-03 NOTE — Telephone Encounter (Signed)
Called and spoke to patient.  He has antoher issue, I think weight loss, which he wanted to sort out before we proceeded  we agreed to talk again in about 10 -12 weeks

## 2020-06-05 NOTE — Telephone Encounter (Signed)
To scheduling to arrange for a telehealth appointment in ~ 10-12 weeks to insure Dr. Caryl Comes and the patient speak again.

## 2020-06-05 NOTE — Telephone Encounter (Signed)
Spoke with patient . He is going to see Dr. Emily Filbert this week and if he gets "the ok" from him he will have pcp get in touch with Dr. Caryl Comes .   Patient will discuss tx vs deferring to a phone visit for fu with pcp .

## 2020-06-07 ENCOUNTER — Ambulatory Visit (INDEPENDENT_AMBULATORY_CARE_PROVIDER_SITE_OTHER): Payer: PPO

## 2020-06-07 DIAGNOSIS — R1084 Generalized abdominal pain: Secondary | ICD-10-CM | POA: Diagnosis not present

## 2020-06-07 DIAGNOSIS — I441 Atrioventricular block, second degree: Secondary | ICD-10-CM | POA: Diagnosis not present

## 2020-06-07 DIAGNOSIS — C9110 Chronic lymphocytic leukemia of B-cell type not having achieved remission: Secondary | ICD-10-CM | POA: Diagnosis not present

## 2020-06-09 LAB — CUP PACEART REMOTE DEVICE CHECK
Battery Remaining Longevity: 65 mo
Battery Remaining Percentage: 89 %
Battery Voltage: 2.95 V
Brady Statistic AP VP Percent: 86 %
Brady Statistic AP VS Percent: 1 %
Brady Statistic AS VP Percent: 11 %
Brady Statistic AS VS Percent: 2.3 %
Brady Statistic RA Percent Paced: 85 %
Date Time Interrogation Session: 20220413020020
Implantable Lead Implant Date: 20190110
Implantable Lead Implant Date: 20190110
Implantable Lead Implant Date: 20190110
Implantable Lead Location: 753858
Implantable Lead Location: 753859
Implantable Lead Location: 753860
Implantable Pulse Generator Implant Date: 20190110
Lead Channel Impedance Value: 440 Ohm
Lead Channel Impedance Value: 630 Ohm
Lead Channel Impedance Value: 800 Ohm
Lead Channel Pacing Threshold Amplitude: 0.5 V
Lead Channel Pacing Threshold Amplitude: 0.75 V
Lead Channel Pacing Threshold Amplitude: 1.75 V
Lead Channel Pacing Threshold Pulse Width: 0.5 ms
Lead Channel Pacing Threshold Pulse Width: 0.5 ms
Lead Channel Pacing Threshold Pulse Width: 1.5 ms
Lead Channel Sensing Intrinsic Amplitude: 12 mV
Lead Channel Sensing Intrinsic Amplitude: 5 mV
Lead Channel Setting Pacing Amplitude: 2 V
Lead Channel Setting Pacing Amplitude: 2.5 V
Lead Channel Setting Pacing Amplitude: 2.5 V
Lead Channel Setting Pacing Pulse Width: 0.5 ms
Lead Channel Setting Pacing Pulse Width: 1.5 ms
Lead Channel Setting Sensing Sensitivity: 2 mV
Pulse Gen Model: 3262
Pulse Gen Serial Number: 8942222

## 2020-06-12 ENCOUNTER — Other Ambulatory Visit: Payer: Self-pay | Admitting: Internal Medicine

## 2020-06-22 NOTE — Progress Notes (Signed)
Remote pacemaker transmission.   

## 2020-07-17 ENCOUNTER — Other Ambulatory Visit: Payer: Self-pay | Admitting: Family

## 2020-08-02 DIAGNOSIS — M79672 Pain in left foot: Secondary | ICD-10-CM | POA: Diagnosis not present

## 2020-08-02 DIAGNOSIS — L851 Acquired keratosis [keratoderma] palmaris et plantaris: Secondary | ICD-10-CM | POA: Diagnosis not present

## 2020-08-02 DIAGNOSIS — L84 Corns and callosities: Secondary | ICD-10-CM | POA: Diagnosis not present

## 2020-08-02 DIAGNOSIS — M21622 Bunionette of left foot: Secondary | ICD-10-CM | POA: Diagnosis not present

## 2020-08-02 DIAGNOSIS — Q828 Other specified congenital malformations of skin: Secondary | ICD-10-CM | POA: Diagnosis not present

## 2020-08-02 DIAGNOSIS — D2372 Other benign neoplasm of skin of left lower limb, including hip: Secondary | ICD-10-CM | POA: Diagnosis not present

## 2020-08-22 ENCOUNTER — Other Ambulatory Visit: Payer: Self-pay | Admitting: Internal Medicine

## 2020-09-06 ENCOUNTER — Ambulatory Visit (INDEPENDENT_AMBULATORY_CARE_PROVIDER_SITE_OTHER): Payer: PPO

## 2020-09-06 DIAGNOSIS — E782 Mixed hyperlipidemia: Secondary | ICD-10-CM | POA: Diagnosis not present

## 2020-09-06 DIAGNOSIS — I441 Atrioventricular block, second degree: Secondary | ICD-10-CM | POA: Diagnosis not present

## 2020-09-06 DIAGNOSIS — E538 Deficiency of other specified B group vitamins: Secondary | ICD-10-CM | POA: Diagnosis not present

## 2020-09-06 DIAGNOSIS — M818 Other osteoporosis without current pathological fracture: Secondary | ICD-10-CM | POA: Diagnosis not present

## 2020-09-06 DIAGNOSIS — C9110 Chronic lymphocytic leukemia of B-cell type not having achieved remission: Secondary | ICD-10-CM | POA: Diagnosis not present

## 2020-09-06 LAB — CUP PACEART REMOTE DEVICE CHECK
Battery Remaining Longevity: 30 mo
Battery Remaining Percentage: 41 %
Battery Voltage: 2.93 V
Brady Statistic AP VP Percent: 86 %
Brady Statistic AP VS Percent: 1 %
Brady Statistic AS VP Percent: 11 %
Brady Statistic AS VS Percent: 2 %
Brady Statistic RA Percent Paced: 86 %
Date Time Interrogation Session: 20220713020012
Implantable Lead Implant Date: 20190110
Implantable Lead Implant Date: 20190110
Implantable Lead Implant Date: 20190110
Implantable Lead Location: 753858
Implantable Lead Location: 753859
Implantable Lead Location: 753860
Implantable Pulse Generator Implant Date: 20190110
Lead Channel Impedance Value: 430 Ohm
Lead Channel Impedance Value: 610 Ohm
Lead Channel Impedance Value: 800 Ohm
Lead Channel Pacing Threshold Amplitude: 0.5 V
Lead Channel Pacing Threshold Amplitude: 0.75 V
Lead Channel Pacing Threshold Amplitude: 1.75 V
Lead Channel Pacing Threshold Pulse Width: 0.5 ms
Lead Channel Pacing Threshold Pulse Width: 0.5 ms
Lead Channel Pacing Threshold Pulse Width: 1.5 ms
Lead Channel Sensing Intrinsic Amplitude: 12 mV
Lead Channel Sensing Intrinsic Amplitude: 3.8 mV
Lead Channel Setting Pacing Amplitude: 2 V
Lead Channel Setting Pacing Amplitude: 2.5 V
Lead Channel Setting Pacing Amplitude: 2.5 V
Lead Channel Setting Pacing Pulse Width: 0.5 ms
Lead Channel Setting Pacing Pulse Width: 1.5 ms
Lead Channel Setting Sensing Sensitivity: 2 mV
Pulse Gen Model: 3262
Pulse Gen Serial Number: 8942222

## 2020-09-13 DIAGNOSIS — D500 Iron deficiency anemia secondary to blood loss (chronic): Secondary | ICD-10-CM | POA: Diagnosis not present

## 2020-09-13 DIAGNOSIS — E782 Mixed hyperlipidemia: Secondary | ICD-10-CM | POA: Diagnosis not present

## 2020-09-13 DIAGNOSIS — C9110 Chronic lymphocytic leukemia of B-cell type not having achieved remission: Secondary | ICD-10-CM | POA: Diagnosis not present

## 2020-09-13 DIAGNOSIS — I420 Dilated cardiomyopathy: Secondary | ICD-10-CM | POA: Diagnosis not present

## 2020-09-13 DIAGNOSIS — E538 Deficiency of other specified B group vitamins: Secondary | ICD-10-CM | POA: Diagnosis not present

## 2020-09-13 DIAGNOSIS — Z125 Encounter for screening for malignant neoplasm of prostate: Secondary | ICD-10-CM | POA: Diagnosis not present

## 2020-09-13 DIAGNOSIS — M818 Other osteoporosis without current pathological fracture: Secondary | ICD-10-CM | POA: Diagnosis not present

## 2020-09-20 NOTE — Telephone Encounter (Signed)
CTSP EF remains down, previously failed to tolerate entresto,  Gave him the name of farxiga and jardiance-- will look into costs and let us know

## 2020-09-21 NOTE — Telephone Encounter (Signed)
Patient calling and will be by the office in the morning to pick up samples

## 2020-09-22 ENCOUNTER — Telehealth: Payer: Self-pay | Admitting: Internal Medicine

## 2020-09-22 ENCOUNTER — Encounter: Payer: Self-pay | Admitting: *Deleted

## 2020-09-22 ENCOUNTER — Other Ambulatory Visit: Payer: Self-pay | Admitting: *Deleted

## 2020-09-22 MED ORDER — EMPAGLIFLOZIN 10 MG PO TABS: 10.0000 mg | ORAL_TABLET | Freq: Every day | ORAL | 6 refills | Status: DC

## 2020-09-22 MED ORDER — EMPAGLIFLOZIN 10 MG PO TABS: 10.0000 mg | ORAL_TABLET | Freq: Every day | ORAL | 0 refills | Status: DC

## 2020-09-22 NOTE — Telephone Encounter (Signed)
Left VM to schedule appt  

## 2020-09-22 NOTE — Telephone Encounter (Signed)
-----   Message from Emily Filbert, RN sent at 09/22/2020 10:40 AM EDT ----- Please call to arrange a 3 month f/u with Dr. Caryl Comes- starting on Jardiance.  Patient notified through [place] he would be getting a call.  He picked up his samples this morning!  Thank you!'

## 2020-09-29 NOTE — Progress Notes (Signed)
Remote pacemaker transmission.   

## 2020-10-16 ENCOUNTER — Other Ambulatory Visit: Payer: Self-pay | Admitting: Family

## 2020-12-05 DIAGNOSIS — Z23 Encounter for immunization: Secondary | ICD-10-CM | POA: Diagnosis not present

## 2020-12-06 ENCOUNTER — Ambulatory Visit (INDEPENDENT_AMBULATORY_CARE_PROVIDER_SITE_OTHER): Payer: PPO

## 2020-12-06 DIAGNOSIS — I441 Atrioventricular block, second degree: Secondary | ICD-10-CM

## 2020-12-08 LAB — CUP PACEART REMOTE DEVICE CHECK
Battery Remaining Longevity: 27 mo
Battery Remaining Percentage: 37 %
Battery Voltage: 2.93 V
Brady Statistic AP VP Percent: 87 %
Brady Statistic AP VS Percent: 1 %
Brady Statistic AS VP Percent: 11 %
Brady Statistic AS VS Percent: 1.8 %
Brady Statistic RA Percent Paced: 86 %
Date Time Interrogation Session: 20221012020014
Implantable Lead Implant Date: 20190110
Implantable Lead Implant Date: 20190110
Implantable Lead Implant Date: 20190110
Implantable Lead Location: 753858
Implantable Lead Location: 753859
Implantable Lead Location: 753860
Implantable Pulse Generator Implant Date: 20190110
Lead Channel Impedance Value: 440 Ohm
Lead Channel Impedance Value: 640 Ohm
Lead Channel Impedance Value: 800 Ohm
Lead Channel Pacing Threshold Amplitude: 0.5 V
Lead Channel Pacing Threshold Amplitude: 0.75 V
Lead Channel Pacing Threshold Amplitude: 1.75 V
Lead Channel Pacing Threshold Pulse Width: 0.5 ms
Lead Channel Pacing Threshold Pulse Width: 0.5 ms
Lead Channel Pacing Threshold Pulse Width: 1.5 ms
Lead Channel Sensing Intrinsic Amplitude: 12 mV
Lead Channel Sensing Intrinsic Amplitude: 5 mV
Lead Channel Setting Pacing Amplitude: 2 V
Lead Channel Setting Pacing Amplitude: 2.5 V
Lead Channel Setting Pacing Amplitude: 2.5 V
Lead Channel Setting Pacing Pulse Width: 0.5 ms
Lead Channel Setting Pacing Pulse Width: 1.5 ms
Lead Channel Setting Sensing Sensitivity: 2 mV
Pulse Gen Model: 3262
Pulse Gen Serial Number: 8942222

## 2020-12-15 NOTE — Progress Notes (Signed)
Remote pacemaker transmission.   

## 2020-12-25 ENCOUNTER — Other Ambulatory Visit: Payer: Self-pay | Admitting: Internal Medicine

## 2020-12-26 ENCOUNTER — Encounter: Payer: Self-pay | Admitting: Internal Medicine

## 2020-12-26 ENCOUNTER — Other Ambulatory Visit: Payer: Self-pay

## 2020-12-26 ENCOUNTER — Ambulatory Visit: Payer: PPO | Admitting: Internal Medicine

## 2020-12-26 VITALS — BP 102/62 | HR 60 | Ht 70.0 in | Wt 177.0 lb

## 2020-12-26 DIAGNOSIS — Z79899 Other long term (current) drug therapy: Secondary | ICD-10-CM

## 2020-12-26 DIAGNOSIS — I255 Ischemic cardiomyopathy: Secondary | ICD-10-CM | POA: Diagnosis not present

## 2020-12-26 DIAGNOSIS — R001 Bradycardia, unspecified: Secondary | ICD-10-CM

## 2020-12-26 DIAGNOSIS — I5022 Chronic systolic (congestive) heart failure: Secondary | ICD-10-CM | POA: Diagnosis not present

## 2020-12-26 DIAGNOSIS — Z950 Presence of cardiac pacemaker: Secondary | ICD-10-CM

## 2020-12-26 DIAGNOSIS — I442 Atrioventricular block, complete: Secondary | ICD-10-CM | POA: Diagnosis not present

## 2020-12-26 DIAGNOSIS — I447 Left bundle-branch block, unspecified: Secondary | ICD-10-CM | POA: Diagnosis not present

## 2020-12-26 LAB — PACEMAKER DEVICE OBSERVATION

## 2020-12-26 NOTE — Patient Instructions (Signed)
Medication Instructions:  - Your physician recommends that you continue on your current medications as directed. Please refer to the Current Medication list given to you today.   Listed below are contacts for a few of the [place] that some of our patient's have used and had success with:   1) [facility]  Website: [email or web address]   2) [facility]  Website: [email or web address]  Phone: [phone]  Fax: [phone]  Email: [email or web address]    *If you need a refill on your cardiac medications before your next appointment, please call your pharmacy*   Lab Work: - Your physician recommends that you have lab work today: BMP  If you have labs (blood work) drawn today and your tests are completely normal, you will receive your results only by: [place] (if you have MyChart) OR A paper copy in the mail If you have any lab test that is abnormal or we need to change your treatment, we will call you to review the results.   Testing/Procedures: - none ordered   Follow-Up: At [facility], you and your health needs are our priority.  As part of our continuing mission to provide you with exceptional heart care, we have created designated Provider Care Teams.  These Care Teams include your primary Cardiologist (physician) and Advanced Practice Providers (APPs -  Physician Assistants and Nurse Practitioners) who all work together to provide you with the care you need, when you need it.  We recommend signing up for the patient portal called "MyChart".  Sign up information is provided on this After Visit Summary.  MyChart is used to connect with patients for Virtual Visits (Telemedicine).  Patients are able to view lab/test results, encounter notes, upcoming appointments, etc.  Non-urgent messages can be sent to your provider as well.   To learn more about what you can do with MyChart, go to [email or web address].    Your next appointment:   6  month(s)  The format for your next appointment:   In Person  Provider:   Virl Axe, MD   Other Instructions N/a

## 2020-12-26 NOTE — Progress Notes (Signed)
Patient Care Team: Rusty Aus, MD as PCP - General (Internal Medicine) Deboraha Sprang, MD as PCP - Cardiology (Cardiology) Lloyd Huger, MD as Consulting Physician (Oncology)   HPI  Christopher Daniels is a 86 y.o. male seen in followup of sinus bradycardia; left ventricular dysfunction in the setting of a recent MI. Has LBBB;  he is status post Abbott CRT-P implantation (GT) 2019 when he presented with higher grade heart block.  History of ischemic heart disease prior stenting.  On Ticagrelor as single antiplatelet agent   12/17 STEMI       DATE TEST EF   12/17 LHC 25-30% RCA  Stent 100% >> 0  LAD Stent 80% >>0  5/18 cMRI   36 % + LGE apical septum--normal LV thickness  5/18 Echo   30-35 % LVH 1.3 cm  9/18 LHC  Patent Stents  2/22 Echo  40-45% LAE  mild AI mild-mod       Date Cr K LDL Hgb  4/18  0.79 4.5     6/18  1.0 4.5  13.7  1/19 0.9 3.8    10/19 1.04 4.3  10.9 (11.2-12/19)  12/20 1.1 4.6  12.3  1/22 1.2 4.3 47 12.3   The patient denies chest pain , shortness of breath, nocturnal dyspnea, orthopnea or peripheral edema.  There have been no palpitations or syncope.  Some orthostatic lightheadedness but no presyncope          Past Medical History:  Diagnosis Date   Arthritis    "joints; fingers, knees" (03/06/2017)   CAD (coronary artery disease)    a. 01/2016 Inf STEMI->PCI/Thrombectomy/DES to mid RCA (4.0 x 30 Resolute DES), EF 25-30%; b. 01/2016 Staged PCI/DES to mLAD (3.0x22 Resolute DES); c. 10/2016 Cath: LM nl, LAD patent stent, 30d, D2 70, LCX nl, OM1/2 nl, RCA patent mid/dist stent.   CLL (chronic lymphocytic leukemia) ([place])    "never taken any RX; just monitor this twice/year" (03/06/2017)   Diverticulitis    GERD (gastroesophageal reflux disease)    H/O Bell's palsy 1960s   HFrEF (heart failure with reduced ejection fraction) (HCC)    High cholesterol    History of kidney stones    HOH (hard of hearing)    Iron deficiency anemia     Ischemic cardiomyopathy    a. 01/2016 EF 25-30%; b. 04/2016 f/u echo: EF 36%, mild glob HK, basal and mid inflat, inf, infsept, mid apical inf AK, mildly dil LA/RA, mild AI/MR, trace TR; c. 06/2016 Cardiac MRI: EF 36%, apical AK, mod diff HK, septal-lateral dyssynchrony   LBBB (left bundle branch block)    Mobitz type 2 second degree AV block 03/06/2017   Myocardial infarction ([place]) 01/27/2016   Presence of permanent cardiac pacemaker    Skin cancer    a. on head years ago   Tachy-brady syndrome ([place])    a. 02/2017 s/p SJM 3262 Quadra Allur MP CRT-P.    Past Surgical History:  Procedure Laterality Date   BACK SURGERY     BI-VENTRICULAR PACEMAKER INSERTION (CRT-P)  03/06/2017   BIV PACEMAKER INSERTION CRT-P N/A 03/06/2017   Procedure: BIV PACEMAKER INSERTION CRT-P;  Surgeon: Evans Lance, MD;  Location: [place] [facility];  Service: Cardiovascular;  Laterality: N/A;   CARDIAC CATHETERIZATION N/A 07/29/2014   Procedure: Left Heart Cath;  Surgeon: Teodoro Spray, MD;  Location: [place] [facility];  Service: Cardiovascular;  Laterality: N/A;   CARDIAC CATHETERIZATION N/A  01/27/2016   Procedure: Left Heart Cath and Coronary Angiography;  Surgeon: [name], MD;  Location: [place] [facility];  Service: Cardiovascular;  Laterality: N/A;   CARDIAC CATHETERIZATION N/A 01/27/2016   Procedure: Coronary Stent Intervention;  Surgeon: [name], MD;  Location: [place] [facility];  Service: Cardiovascular;  Laterality: N/A;   CARDIAC CATHETERIZATION N/A 02/13/2016   Procedure: Coronary Stent Intervention;  Surgeon: [name], MD;  Location: [place] [facility];  Service: Cardiovascular;  Laterality: N/A;   CARDIAC CATHETERIZATION N/A 02/13/2016   Procedure: Left Heart Cath and Coronary Angiography;  Surgeon: [name], MD;  Location: [place] [facility];  Service: Cardiovascular;  Laterality: N/A;   CARDIAC CATHETERIZATION     CATARACT EXTRACTION W/PHACO Right 07/18/2014   Procedure: CATARACT EXTRACTION  PHACO AND INTRAOCULAR LENS PLACEMENT (IOC);  Surgeon: Estill Cotta, MD;  Location: [facility] ORS;  Service: Ophthalmology;  Laterality: Right;  [place] 03:11 AP% 28.4 CDE 75.64   CATARACT EXTRACTION W/PHACO Left 06/19/2015   Procedure: CATARACT EXTRACTION PHACO AND INTRAOCULAR LENS PLACEMENT (IOC);  Surgeon: Estill Cotta, MD;  Location: [facility] ORS;  Service: Ophthalmology;  Laterality: Left;  [place] 02:36.5 AP% 27.7 CDE 69.51 fluid pack lot # [id number]   COLONOSCOPY WITH PROPOFOL N/A 04/20/2018   Procedure: COLONOSCOPY WITH PROPOFOL;  Surgeon: Manya Silvas, MD;  Location: [facility] ENDOS[facility];  Service: Endoscopy;  Laterality: N/A;   ESOPHAGOGASTRODUODENOSCOPY (EGD) WITH PROPOFOL N/A 04/20/2018   Procedure: ESOPHAGOGASTRODUODENOSCOPY (EGD) WITH PROPOFOL;  Surgeon: Manya Silvas, MD;  Location: [facility]OSCOPY;  Service: Endoscopy;  Laterality: N/A;   EYE SURGERY     INGUINAL HERNIA REPAIR Left 2010   INSERT / REPLACE / REMOVE PACEMAKER     LEFT HEART CATH AND CORONARY ANGIOGRAPHY N/A 11/18/2016   Procedure: LEFT HEART CATH AND CORONARY ANGIOGRAPHY;  Surgeon: Wellington Hampshire, MD;  Location: [place] [facility];  Service: Cardiovascular;  Laterality: N/A;   [place]   "ruptured disc; took a piece off my buttocks and fixed it"   SKIN CANCER EXCISION     "top of my head"   TONSILLECTOMY      Current Outpatient Medications  Medication Sig Dispense Refill   atorvastatin (LIPITOR) 80 MG tablet Take 1 tablet (80 mg total) by mouth every evening. 90 tablet 3   BRILINTA 60 MG TABS tablet TAKE ONE TABLET BY MOUTH TWICE DAILY 180 tablet 2   Calcium Carbonate-Vit D-Min (CALTRATE PLUS PO) Take by mouth 2 (two) times daily. Taking tablets  2 daily     carboxymethylcellulose (REFRESH PLUS) 0.5 % SOLN Place 1 drop into both eyes 3 (three) times daily as needed (for dry eyes).      carvedilol (COREG) 3.125 MG tablet TAKE 1 TABLET BY MOUTH TWICE DAILY 180 tablet 2   co-enzyme Q-10 30 MG  capsule Take 30 mg by mouth daily.     Cyanocobalamin (B-12) 500 MCG SUBL Place 500 mcg under the tongue once a week.      empagliflozin (JARDIANCE) 10 MG TABS tablet Take 1 tablet (10 mg total) by mouth daily. 30 tablet 6   ferrous gluconate (FERGON) 324 MG tablet Takes 1 tablet PO twice per week     lisinopril (PRINIVIL,ZESTRIL) 5 MG tablet Take 2.5 mg by mouth daily.     nitroGLYCERIN (NITROSTAT) 0.4 MG SL tablet Place 1 tablet (0.4 mg total) under the tongue every 5 (five) minutes x 3 doses as needed for chest pain. 25 tablet 3  pantoprazole (PROTONIX) 40 MG tablet Take 40 mg by mouth daily. Taking 1 tablet daily     spironolactone (ALDACTONE) 25 MG tablet TAKE ONE-HALF TABLET ONCE A DAY 45 tablet 2   traMADol (ULTRAM) 50 MG tablet Take 50 mg by mouth 2 (two) times daily as needed.     No current facility-administered medications for this visit.    Allergies  Allergen Reactions   Niaspan [Niacin Er] Anaphylaxis and Other (See Comments)    Episode happened 10 to 15 years ago.  Has not tried any other similar meds since   Adhesive [Tape] Other (See Comments)    Skin is very sensitive; PLEASE USE PAPER TAPE!!      ROS:  Please see the history of present illness.     All other systems reviewed and negative.    BP 102/62 (BP Location: Left Arm, Patient Position: Sitting, Cuff Size: Normal)   Pulse 60   Ht 5\' 10"  (1.778 m)   Wt 177 lb (80.3 kg)   SpO2 91%   BMI 25.40 kg/m  Well developed and well nourished in no acute distress HENT normal Neck supple with JVP-flat Clear Device pocket well healed; without hematoma or erythema.  There is no tethering  Regular rate and rhythm, no murmur Abd-soft with active BS No Clubbing cyanosis  edema Skin-warm and dry A & Oriented  Grossly normal sensory and motor function  ECG   P-synchronous/ AV  Pacing  @ 60 with QRS neg Lead 1 and  QRS pos  Lead V1    Assessment and Plan:  Sinus bradycardia    High-grade heart block  intermittent  Ischemic cardiomyopathy  status post DES 2   12/17  EF 36% MRI 5/18   Congestive heart failure class II  Left bundle branch block  CRT-pacemaker [employer] Jude    Hyperlipidemia  Orthostatic hypotension  Low voltage  With the cardiomyopathy we will continue lisinopril 2.5, carvedilol 3.125 and spironolactone 12.5 up titration which has been limited by hypotension  With heart failure we will continue Jardiance 10; euvolemic  LDL at goal 1/22; continue atorvastatin 80  ECG with good resynchronization in 4-2 configuration-- but threshold > 2.5 so reprogrammed 4-can with threshold at 1.5   Without symptoms of ischemia.  Continue ticagrelor 60 bid     Current medicines are reviewed at length with the patient today .  The patient does not   have concerns regarding medicines.

## 2020-12-27 LAB — BASIC METABOLIC PANEL
BUN/Creatinine Ratio: 11 (ref 10–24)
BUN: 12 mg/dL (ref 8–27)
CO2: 26 mmol/L (ref 20–29)
Calcium: 9.6 mg/dL (ref 8.6–10.2)
Chloride: 103 mmol/L (ref 96–106)
Creatinine, Ser: 1.09 mg/dL (ref 0.76–1.27)
Glucose: 91 mg/dL (ref 70–99)
Potassium: 4.7 mmol/L (ref 3.5–5.2)
Sodium: 141 mmol/L (ref 134–144)
eGFR: 67 mL/min/{1.73_m2} (ref >59)

## 2021-01-01 DIAGNOSIS — D2261 Melanocytic nevi of right upper limb, including shoulder: Secondary | ICD-10-CM | POA: Diagnosis not present

## 2021-01-01 DIAGNOSIS — D2262 Melanocytic nevi of left upper limb, including shoulder: Secondary | ICD-10-CM | POA: Diagnosis not present

## 2021-01-01 DIAGNOSIS — D485 Neoplasm of uncertain behavior of skin: Secondary | ICD-10-CM | POA: Diagnosis not present

## 2021-01-01 DIAGNOSIS — B078 Other viral warts: Secondary | ICD-10-CM | POA: Diagnosis not present

## 2021-01-01 DIAGNOSIS — Z85828 Personal history of other malignant neoplasm of skin: Secondary | ICD-10-CM | POA: Diagnosis not present

## 2021-01-01 DIAGNOSIS — D225 Melanocytic nevi of trunk: Secondary | ICD-10-CM | POA: Diagnosis not present

## 2021-01-01 DIAGNOSIS — L570 Actinic keratosis: Secondary | ICD-10-CM | POA: Diagnosis not present

## 2021-01-01 DIAGNOSIS — X32XXXA Exposure to sunlight, initial encounter: Secondary | ICD-10-CM | POA: Diagnosis not present

## 2021-01-05 ENCOUNTER — Other Ambulatory Visit: Payer: Self-pay | Admitting: *Deleted

## 2021-01-05 MED ORDER — EMPAGLIFLOZIN 10 MG PO TABS: 10.0000 mg | ORAL_TABLET | Freq: Every day | ORAL | 3 refills | Status: DC

## 2021-01-24 DIAGNOSIS — M810 Age-related osteoporosis without current pathological fracture: Secondary | ICD-10-CM | POA: Diagnosis not present

## 2021-01-25 ENCOUNTER — Other Ambulatory Visit: Payer: Self-pay | Admitting: *Deleted

## 2021-01-25 DIAGNOSIS — C9110 Chronic lymphocytic leukemia of B-cell type not having achieved remission: Secondary | ICD-10-CM

## 2021-01-25 DIAGNOSIS — D509 Iron deficiency anemia, unspecified: Secondary | ICD-10-CM

## 2021-01-26 NOTE — Progress Notes (Signed)
[place]  [phone] [phone]) [phone]  ID: Christopher Daniels OB: 08-25-1934  MR#: [id number]  WEX#:[id number]  Patient Care Team: Rusty Aus, MD as PCP - General (Internal Medicine) Deboraha Sprang, MD as PCP - Cardiology (Cardiology) Lloyd Huger, MD as Consulting Physician (Oncology)  I connected with Christopher Daniels on 02/04/21 at  2:45 PM EST by video enabled telemedicine visit and verified that I am speaking with the correct person using two identifiers.   I discussed the limitations, risks, security and privacy concerns of performing an evaluation and management service by telemedicine and the availability of in-person appointments. I also discussed with the patient that there may be a patient responsible charge related to this service. The patient expressed understanding and agreed to proceed.   Other persons participating in the visit and their role in the encounter: Patient, MD.  Patient's location: Home. Provider's location: Clinic.   CHIEF COMPLAINT: CLL, iron deficiency anemia.  INTERVAL HISTORY: Patient agreed to video assisted telemedicine visit for discussion of his laboratory work and routine yearly evaluation.  He continues to feel well and remains asymptomatic.  He denies any weakness or fatigue.  He has no neurologic complaints.  He has no fevers or night sweats.  He has a good appetite and denies weight loss. He denies any chest pain, cough, hemoptysis, or shortness of breath.  He denies any nausea, vomiting, constipation, or diarrhea.  He has no melena or hematochezia.  He has no urinary complaints.  Patient feels at his baseline offers no specific complaints today.  REVIEW OF SYSTEMS:   Review of Systems  Constitutional: Negative.  Negative for fever, malaise/fatigue and weight loss.  Respiratory: Negative.  Negative for cough and shortness of breath.   Cardiovascular: Negative.  Negative for chest pain and leg swelling.   Gastrointestinal: Negative.  Negative for abdominal pain, blood in stool, constipation and melena.  Genitourinary: Negative.  Negative for dysuria and hematuria.  Musculoskeletal: Negative.  Negative for back pain.  Skin: Negative.  Negative for rash.  Neurological: Negative.  Negative for sensory change, focal weakness and weakness.  Endo/Heme/Allergies:  Does not bruise/bleed easily.  Psychiatric/Behavioral: Negative.  The patient is not nervous/anxious.    As per HPI. Otherwise, a complete review of systems is negative.  PAST MEDICAL HISTORY: Past Medical History:  Diagnosis Date   Arthritis    "joints; fingers, knees" (03/06/2017)   CAD (coronary artery disease)    a. 01/2016 Inf STEMI->PCI/Thrombectomy/DES to mid RCA (4.0 x 30 Resolute DES), EF 25-30%; b. 01/2016 Staged PCI/DES to mLAD (3.0x22 Resolute DES); c. 10/2016 Cath: LM nl, LAD patent stent, 30d, D2 70, LCX nl, OM1/2 nl, RCA patent mid/dist stent.   CLL (chronic lymphocytic leukemia) ([place])    "never taken any RX; just monitor this twice/year" (03/06/2017)   Diverticulitis    GERD (gastroesophageal reflux disease)    H/O Bell's palsy 1960s   HFrEF (heart failure with reduced ejection fraction) (HCC)    High cholesterol    History of kidney stones    HOH (hard of hearing)    Iron deficiency anemia    Ischemic cardiomyopathy    a. 01/2016 EF 25-30%; b. 04/2016 f/u echo: EF 36%, mild glob HK, basal and mid inflat, inf, infsept, mid apical inf AK, mildly dil LA/RA, mild AI/MR, trace TR; c. 06/2016 Cardiac MRI: EF 36%, apical AK, mod diff HK, septal-lateral dyssynchrony   LBBB (left bundle branch block)    Mobitz type  2 second degree AV block 03/06/2017   Myocardial infarction ([place]) 01/27/2016   Presence of permanent cardiac pacemaker    Skin cancer    a. on head years ago   Tachy-brady syndrome ([place])    a. 02/2017 s/p SJM 3262 Quadra Allur MP CRT-P.    PAST SURGICAL HISTORY: Past Surgical History:  Procedure Laterality  Date   BACK SURGERY     BI-VENTRICULAR PACEMAKER INSERTION (CRT-P)  03/06/2017   BIV PACEMAKER INSERTION CRT-P N/A 03/06/2017   Procedure: BIV PACEMAKER INSERTION CRT-P;  Surgeon: Evans Lance, MD;  Location: [place] [facility];  Service: Cardiovascular;  Laterality: N/A;   CARDIAC CATHETERIZATION N/A 07/29/2014   Procedure: Left Heart Cath;  Surgeon: Teodoro Spray, MD;  Location: [place] [facility];  Service: Cardiovascular;  Laterality: N/A;   CARDIAC CATHETERIZATION N/A 01/27/2016   Procedure: Left Heart Cath and Coronary Angiography;  Surgeon: [name], MD;  Location: [place] [facility];  Service: Cardiovascular;  Laterality: N/A;   CARDIAC CATHETERIZATION N/A 01/27/2016   Procedure: Coronary Stent Intervention;  Surgeon: [name], MD;  Location: [place] [facility];  Service: Cardiovascular;  Laterality: N/A;   CARDIAC CATHETERIZATION N/A 02/13/2016   Procedure: Coronary Stent Intervention;  Surgeon: [name], MD;  Location: [place] [facility];  Service: Cardiovascular;  Laterality: N/A;   CARDIAC CATHETERIZATION N/A 02/13/2016   Procedure: Left Heart Cath and Coronary Angiography;  Surgeon: [name], MD;  Location: [place] [facility];  Service: Cardiovascular;  Laterality: N/A;   CARDIAC CATHETERIZATION     CATARACT EXTRACTION W/PHACO Right 07/18/2014   Procedure: CATARACT EXTRACTION PHACO AND INTRAOCULAR LENS PLACEMENT (IOC);  Surgeon: Estill Cotta, MD;  Location: [facility] ORS;  Service: Ophthalmology;  Laterality: Right;  [place] 03:11 AP% 28.4 CDE 75.64   CATARACT EXTRACTION W/PHACO Left 06/19/2015   Procedure: CATARACT EXTRACTION PHACO AND INTRAOCULAR LENS PLACEMENT (IOC);  Surgeon: Estill Cotta, MD;  Location: [facility] ORS;  Service: Ophthalmology;  Laterality: Left;  [place] 02:36.5 AP% 27.7 CDE 69.51 fluid pack lot # [id number]   COLONOSCOPY WITH PROPOFOL N/A 04/20/2018   Procedure: COLONOSCOPY WITH PROPOFOL;  Surgeon: Manya Silvas, MD;  Location: [facility] ENDOSCOPY;  Service:  Endoscopy;  Laterality: N/A;   ESOPHAGOGASTRODUODENOSCOPY (EGD) WITH PROPOFOL N/A 04/20/2018   Procedure: ESOPHAGOGASTRODUODENOSCOPY (EGD) WITH PROPOFOL;  Surgeon: Manya Silvas, MD;  Location: [facility] ENDOSCOPY;  Service: Endoscopy;  Laterality: N/A;   EYE SURGERY     INGUINAL HERNIA REPAIR Left 2010   INSERT / REPLACE / REMOVE PACEMAKER     LEFT HEART CATH AND CORONARY ANGIOGRAPHY N/A 11/18/2016   Procedure: LEFT HEART CATH AND CORONARY ANGIOGRAPHY;  Surgeon: Wellington Hampshire, MD;  Location: [place] [facility];  Service: Cardiovascular;  Laterality: N/A;   [place]   "ruptured disc; took a piece off my buttocks and fixed it"   SKIN CANCER EXCISION     "top of my head"   TONSILLECTOMY      FAMILY HISTORY: Reviewed and unchanged. No reported history of malignancy or chronic disease.     ADVANCED DIRECTIVES:    HEALTH MAINTENANCE: Social History   Tobacco Use   Smoking status: Former    Types: Pipe, Cigars    Quit date: 1960    Years since quitting: 62.9   Smokeless tobacco: Former    Types: Chew    Quit date: 1960  Vaping Use   Vaping Use: Never used  Substance Use Topics  Alcohol use: No   Drug use: Never     Colonoscopy:  PAP:  Bone density:  Lipid panel:  Allergies  Allergen Reactions   Niaspan [Niacin Er] Anaphylaxis and Other (See Comments)    Episode happened 10 to 15 years ago.  Has not tried any other similar meds since   Adhesive [Tape] Other (See Comments)    Skin is very sensitive; PLEASE USE PAPER TAPE!!    Current Outpatient Medications  Medication Sig Dispense Refill   BRILINTA 60 MG TABS tablet TAKE ONE TABLET BY MOUTH TWICE DAILY 180 tablet 2   Calcium Carbonate-Vit D-Min (CALTRATE PLUS PO) Take by mouth 2 (two) times daily. Taking tablets  2 daily     carvedilol (COREG) 3.125 MG tablet TAKE 1 TABLET BY MOUTH TWICE DAILY 180 tablet 3   co-enzyme Q-10 30 MG capsule Take 30 mg by mouth daily.     Cyanocobalamin (B-12) 500 MCG  SUBL Place 500 mcg under the tongue once a week.      empagliflozin (JARDIANCE) 10 MG TABS tablet Take 1 tablet (10 mg total) by mouth daily. 90 tablet 3   ferrous gluconate (FERGON) 324 MG tablet Takes 1 tablet PO twice per week     lisinopril (PRINIVIL,ZESTRIL) 5 MG tablet Take 2.5 mg by mouth daily.     pantoprazole (PROTONIX) 40 MG tablet Take 40 mg by mouth daily. Taking 1 tablet daily     spironolactone (ALDACTONE) 25 MG tablet TAKE ONE-HALF TABLET ONCE A DAY 45 tablet 2   atorvastatin (LIPITOR) 80 MG tablet Take 1 tablet (80 mg total) by mouth every evening. 90 tablet 3   carboxymethylcellulose (REFRESH PLUS) 0.5 % SOLN Place 1 drop into both eyes 3 (three) times daily as needed (for dry eyes).  (Patient not taking: Reported on 02/01/2021)     nitroGLYCERIN (NITROSTAT) 0.4 MG SL tablet Place 1 tablet (0.4 mg total) under the tongue every 5 (five) minutes x 3 doses as needed for chest pain. (Patient not taking: Reported on 02/01/2021) 25 tablet 3   No current facility-administered medications for this visit.    OBJECTIVE: There were no vitals filed for this visit.   There is no height or weight on file to calculate BMI.    ECOG FS:0 - Asymptomatic  General: Well-developed, well-nourished, no acute distress. HEENT: Normocephalic. Neuro: Alert, answering all questions appropriately. Cranial nerves grossly intact. Psych: Normal affect.   LAB RESULTS:  Lab Results  Component Value Date   NA 141 12/26/2020   K 4.7 12/26/2020   CL 103 12/26/2020   CO2 26 12/26/2020   GLUCOSE 91 12/26/2020   BUN 12 12/26/2020   CREATININE 1.09 12/26/2020   CALCIUM 9.6 12/26/2020   PROT 5.6 (L) 01/27/2016   ALBUMIN 3.4 (L) 01/27/2016   AST 21 01/27/2016   ALT 17 01/27/2016   ALKPHOS 53 01/27/2016   BILITOT 0.7 01/27/2016   GFRNONAA >60 12/11/2017   GFRAA >60 12/11/2017    Lab Results  Component Value Date   WBC 10.5 01/31/2021   NEUTROABS 3.2 01/31/2021   HGB 14.6 01/31/2021   HCT 43.4  01/31/2021   MCV 98.4 01/31/2021   PLT 215 01/31/2021   Lab Results  Component Value Date   IRON 109 01/31/2021   TIBC 295 01/31/2021   IRONPCTSAT 37 01/31/2021   Lab Results  Component Value Date   FERRITIN 24 01/31/2021     STUDIES: No results found.  ASSESSMENT: CLL, Rai stage 0.  Iron  deficiency anemia.  PLAN:    1.  CLL: Patient's white blood cell count continues to be within normal limits at 10.5 today.  Previously, flow cytometry confirmed a monoclonal B-cell population consistent with CLL. CT in August 2014 was reviewed independently and did not reveal any underlying lymphadenopathy.  No further intervention is needed at this time.  Patient does not require additional imaging or a bone marrow biopsy unless there is suspicion of progression of disease.  Return to clinic in 1 year with repeat laboratory work and video assisted telemedicine. 2.  Iron deficiency anemia: Resolved.  Patient's hemoglobin and iron stores continue to be within normal limits.  Colonoscopy and EGD on April 20, 2018 did not reveal any distinct pathology or evidence of bleeding.  Patient does not require additional IV Feraheme.  He last received treatment on May 26, 2018.  Continue oral iron supplementation.  Return to clinic in 1 year as above.  I provided 20 minutes of face-to-face video visit time during this encounter which included chart review, counseling, and coordination of care as documented above.   Patient expressed understanding and was in agreement with this plan. He also understands that He can call clinic at any time with any questions, concerns, or complaints.    Lloyd Huger, MD   02/04/2021 6:35 AM

## 2021-01-30 ENCOUNTER — Encounter: Payer: Self-pay | Admitting: Oncology

## 2021-01-30 LAB — CUP PACEART INCLINIC DEVICE CHECK
Battery Remaining Longevity: 22 mo
Battery Voltage: 2.93 V
Brady Statistic RA Percent Paced: 87 %
Brady Statistic RV Percent Paced: 98 %
Date Time Interrogation Session: 20221101093600
Implantable Lead Implant Date: 20190110
Implantable Lead Implant Date: 20190110
Implantable Lead Implant Date: 20190110
Implantable Lead Location: 753858
Implantable Lead Location: 753859
Implantable Lead Location: 753860
Implantable Pulse Generator Implant Date: 20190110
Lead Channel Impedance Value: 412.5 Ohm
Lead Channel Impedance Value: 412.5 Ohm
Lead Channel Impedance Value: 625 Ohm
Lead Channel Pacing Threshold Amplitude: 0.5 V
Lead Channel Pacing Threshold Amplitude: 0.5 V
Lead Channel Pacing Threshold Amplitude: 0.75 V
Lead Channel Pacing Threshold Amplitude: 0.75 V
Lead Channel Pacing Threshold Amplitude: 1.5 V
Lead Channel Pacing Threshold Amplitude: 1.5 V
Lead Channel Pacing Threshold Pulse Width: 0.5 ms
Lead Channel Pacing Threshold Pulse Width: 0.5 ms
Lead Channel Pacing Threshold Pulse Width: 0.5 ms
Lead Channel Pacing Threshold Pulse Width: 0.5 ms
Lead Channel Pacing Threshold Pulse Width: 1.5 ms
Lead Channel Pacing Threshold Pulse Width: 1.5 ms
Lead Channel Sensing Intrinsic Amplitude: 12 mV
Lead Channel Sensing Intrinsic Amplitude: 4.1 mV
Lead Channel Setting Pacing Amplitude: 2 V
Lead Channel Setting Pacing Amplitude: 2 V
Lead Channel Setting Pacing Amplitude: 2.5 V
Lead Channel Setting Pacing Pulse Width: 0.5 ms
Lead Channel Setting Pacing Pulse Width: 1.5 ms
Lead Channel Setting Sensing Sensitivity: 2 mV
Pulse Gen Model: 3262
Pulse Gen Serial Number: 8942222

## 2021-01-31 ENCOUNTER — Other Ambulatory Visit: Payer: Self-pay

## 2021-01-31 ENCOUNTER — Inpatient Hospital Stay: Payer: PPO | Attending: Oncology

## 2021-01-31 DIAGNOSIS — C9110 Chronic lymphocytic leukemia of B-cell type not having achieved remission: Secondary | ICD-10-CM | POA: Diagnosis not present

## 2021-01-31 DIAGNOSIS — D509 Iron deficiency anemia, unspecified: Secondary | ICD-10-CM | POA: Diagnosis not present

## 2021-01-31 LAB — CBC WITH DIFFERENTIAL/PLATELET
Abs Immature Granulocytes: 0.03 10*3/uL (ref 0.00–0.07)
Basophils Absolute: 0.1 10*3/uL (ref 0.0–0.1)
Basophils Relative: 1 %
Eosinophils Absolute: 0.2 10*3/uL (ref 0.0–0.5)
Eosinophils Relative: 2 %
HCT: 43.4 % (ref 39.0–52.0)
Hemoglobin: 14.6 g/dL (ref 13.0–17.0)
Immature Granulocytes: 0 %
Lymphocytes Relative: 59 %
Lymphs Abs: 6.3 10*3/uL — ABNORMAL HIGH (ref 0.7–4.0)
MCH: 33.1 pg (ref 26.0–34.0)
MCHC: 33.6 g/dL (ref 30.0–36.0)
MCV: 98.4 fL (ref 80.0–100.0)
Monocytes Absolute: 0.7 10*3/uL (ref 0.1–1.0)
Monocytes Relative: 7 %
Neutro Abs: 3.2 10*3/uL (ref 1.7–7.7)
Neutrophils Relative %: 31 %
Platelets: 215 10*3/uL (ref 150–400)
RBC: 4.41 MIL/uL (ref 4.22–5.81)
RDW: 13.4 % (ref 11.5–15.5)
WBC: 10.5 10*3/uL (ref 4.0–10.5)
nRBC: 0 % (ref 0.0–0.2)

## 2021-01-31 LAB — IRON AND TIBC
Iron: 109 ug/dL (ref 45–182)
Saturation Ratios: 37 % (ref 17.9–39.5)
TIBC: 295 ug/dL (ref 250–450)
UIBC: 186 ug/dL

## 2021-01-31 LAB — FERRITIN: Ferritin: 24 ng/mL (ref 24–336)

## 2021-02-01 ENCOUNTER — Encounter: Payer: Self-pay | Admitting: Oncology

## 2021-02-01 ENCOUNTER — Inpatient Hospital Stay: Payer: PPO | Admitting: Oncology

## 2021-02-01 DIAGNOSIS — C9110 Chronic lymphocytic leukemia of B-cell type not having achieved remission: Secondary | ICD-10-CM

## 2021-02-04 ENCOUNTER — Encounter: Payer: Self-pay | Admitting: Oncology

## 2021-03-06 DIAGNOSIS — E782 Mixed hyperlipidemia: Secondary | ICD-10-CM | POA: Diagnosis not present

## 2021-03-06 DIAGNOSIS — D500 Iron deficiency anemia secondary to blood loss (chronic): Secondary | ICD-10-CM | POA: Diagnosis not present

## 2021-03-06 DIAGNOSIS — E538 Deficiency of other specified B group vitamins: Secondary | ICD-10-CM | POA: Diagnosis not present

## 2021-03-06 DIAGNOSIS — M818 Other osteoporosis without current pathological fracture: Secondary | ICD-10-CM | POA: Diagnosis not present

## 2021-03-06 DIAGNOSIS — Z125 Encounter for screening for malignant neoplasm of prostate: Secondary | ICD-10-CM | POA: Diagnosis not present

## 2021-03-07 ENCOUNTER — Ambulatory Visit (INDEPENDENT_AMBULATORY_CARE_PROVIDER_SITE_OTHER): Payer: PPO

## 2021-03-07 DIAGNOSIS — I442 Atrioventricular block, complete: Secondary | ICD-10-CM | POA: Diagnosis not present

## 2021-03-07 LAB — CUP PACEART REMOTE DEVICE CHECK
Battery Remaining Longevity: 22 mo
Battery Remaining Percentage: 32 %
Battery Voltage: 2.92 V
Brady Statistic AP VP Percent: 90 %
Brady Statistic AP VS Percent: 1 %
Brady Statistic AS VP Percent: 9.2 %
Brady Statistic AS VS Percent: 1 %
Brady Statistic RA Percent Paced: 90 %
Date Time Interrogation Session: 20230111020013
Implantable Lead Implant Date: 20190110
Implantable Lead Implant Date: 20190110
Implantable Lead Implant Date: 20190110
Implantable Lead Location: 753858
Implantable Lead Location: 753859
Implantable Lead Location: 753860
Implantable Pulse Generator Implant Date: 20190110
Lead Channel Impedance Value: 400 Ohm
Lead Channel Impedance Value: 440 Ohm
Lead Channel Impedance Value: 660 Ohm
Lead Channel Pacing Threshold Amplitude: 0.5 V
Lead Channel Pacing Threshold Amplitude: 0.75 V
Lead Channel Pacing Threshold Amplitude: 1.5 V
Lead Channel Pacing Threshold Pulse Width: 0.5 ms
Lead Channel Pacing Threshold Pulse Width: 0.5 ms
Lead Channel Pacing Threshold Pulse Width: 1.5 ms
Lead Channel Sensing Intrinsic Amplitude: 11.9 mV
Lead Channel Sensing Intrinsic Amplitude: 4.7 mV
Lead Channel Setting Pacing Amplitude: 2 V
Lead Channel Setting Pacing Amplitude: 2 V
Lead Channel Setting Pacing Amplitude: 2.5 V
Lead Channel Setting Pacing Pulse Width: 0.5 ms
Lead Channel Setting Pacing Pulse Width: 1.5 ms
Lead Channel Setting Sensing Sensitivity: 2 mV
Pulse Gen Model: 3262
Pulse Gen Serial Number: 8942222

## 2021-03-13 DIAGNOSIS — I700 Atherosclerosis of aorta: Secondary | ICD-10-CM | POA: Diagnosis not present

## 2021-03-13 DIAGNOSIS — I495 Sick sinus syndrome: Secondary | ICD-10-CM | POA: Diagnosis not present

## 2021-03-13 DIAGNOSIS — I420 Dilated cardiomyopathy: Secondary | ICD-10-CM | POA: Diagnosis not present

## 2021-03-13 DIAGNOSIS — E538 Deficiency of other specified B group vitamins: Secondary | ICD-10-CM | POA: Diagnosis not present

## 2021-03-13 DIAGNOSIS — R739 Hyperglycemia, unspecified: Secondary | ICD-10-CM | POA: Diagnosis not present

## 2021-03-13 DIAGNOSIS — Z0000 Encounter for general adult medical examination without abnormal findings: Secondary | ICD-10-CM | POA: Diagnosis not present

## 2021-03-13 DIAGNOSIS — C9110 Chronic lymphocytic leukemia of B-cell type not having achieved remission: Secondary | ICD-10-CM | POA: Diagnosis not present

## 2021-03-13 DIAGNOSIS — E782 Mixed hyperlipidemia: Secondary | ICD-10-CM | POA: Diagnosis not present

## 2021-03-15 DIAGNOSIS — M810 Age-related osteoporosis without current pathological fracture: Secondary | ICD-10-CM | POA: Diagnosis not present

## 2021-03-19 DIAGNOSIS — D2371 Other benign neoplasm of skin of right lower limb, including hip: Secondary | ICD-10-CM | POA: Diagnosis not present

## 2021-03-19 DIAGNOSIS — M21621 Bunionette of right foot: Secondary | ICD-10-CM | POA: Diagnosis not present

## 2021-03-19 DIAGNOSIS — L851 Acquired keratosis [keratoderma] palmaris et plantaris: Secondary | ICD-10-CM | POA: Diagnosis not present

## 2021-03-19 DIAGNOSIS — L84 Corns and callosities: Secondary | ICD-10-CM | POA: Diagnosis not present

## 2021-03-19 DIAGNOSIS — M79671 Pain in right foot: Secondary | ICD-10-CM | POA: Diagnosis not present

## 2021-03-19 DIAGNOSIS — Q828 Other specified congenital malformations of skin: Secondary | ICD-10-CM | POA: Diagnosis not present

## 2021-03-19 NOTE — Progress Notes (Signed)
Remote pacemaker transmission.   

## 2021-04-02 DIAGNOSIS — Q828 Other specified congenital malformations of skin: Secondary | ICD-10-CM | POA: Diagnosis not present

## 2021-04-02 DIAGNOSIS — D2371 Other benign neoplasm of skin of right lower limb, including hip: Secondary | ICD-10-CM | POA: Diagnosis not present

## 2021-04-02 DIAGNOSIS — M21621 Bunionette of right foot: Secondary | ICD-10-CM | POA: Diagnosis not present

## 2021-04-02 DIAGNOSIS — L84 Corns and callosities: Secondary | ICD-10-CM | POA: Diagnosis not present

## 2021-04-02 DIAGNOSIS — M79671 Pain in right foot: Secondary | ICD-10-CM | POA: Diagnosis not present

## 2021-04-02 DIAGNOSIS — L851 Acquired keratosis [keratoderma] palmaris et plantaris: Secondary | ICD-10-CM | POA: Diagnosis not present

## 2021-04-02 DIAGNOSIS — M21622 Bunionette of left foot: Secondary | ICD-10-CM | POA: Diagnosis not present

## 2021-04-09 DIAGNOSIS — Z961 Presence of intraocular lens: Secondary | ICD-10-CM | POA: Diagnosis not present

## 2021-04-10 ENCOUNTER — Ambulatory Visit: Payer: PPO | Admitting: Cardiovascular Disease

## 2021-04-10 ENCOUNTER — Telehealth: Payer: Self-pay | Admitting: Internal Medicine

## 2021-04-10 ENCOUNTER — Encounter: Payer: Self-pay | Admitting: Cardiovascular Disease

## 2021-04-10 ENCOUNTER — Other Ambulatory Visit: Payer: Self-pay

## 2021-04-10 VITALS — BP 100/60 | HR 60 | Ht 69.0 in | Wt 177.5 lb

## 2021-04-10 DIAGNOSIS — I420 Dilated cardiomyopathy: Secondary | ICD-10-CM | POA: Diagnosis not present

## 2021-04-10 DIAGNOSIS — I25118 Atherosclerotic heart disease of native coronary artery with other forms of angina pectoris: Secondary | ICD-10-CM

## 2021-04-10 DIAGNOSIS — I495 Sick sinus syndrome: Secondary | ICD-10-CM

## 2021-04-10 DIAGNOSIS — I700 Atherosclerosis of aorta: Secondary | ICD-10-CM | POA: Diagnosis not present

## 2021-04-10 DIAGNOSIS — I480 Paroxysmal atrial fibrillation: Secondary | ICD-10-CM | POA: Diagnosis not present

## 2021-04-10 DIAGNOSIS — I442 Atrioventricular block, complete: Secondary | ICD-10-CM | POA: Diagnosis not present

## 2021-04-10 MED ORDER — NITROGLYCERIN 0.4 MG SL SUBL: 0.4000 mg | SUBLINGUAL_TABLET | SUBLINGUAL | 3 refills | Status: DC | PRN

## 2021-04-10 NOTE — Progress Notes (Signed)
Cardiology Office Note  Date:  04/11/2021   ID:  [name], DOB [birth year], MRN [id number]  PCP:  Rusty Aus, MD   Chief Complaint  Patient presents with   Neck & Jaw Pain     Patient c/o moving mulch on Thursday, April 05 2021, he started having pain in jaw & neck as well as feeling very fatigue the day after. Medications reviewed by the patient verbally.     HPI:  Christopher Daniels is a 87 y.o. male with a hx of   sinus bradycardia,  LBBB, heart block s/p CRT-P 2019,  CAD s/p inferior MI with stenting of RCA December 2017 and staged DES to LAD, ischemic cardiomyopathy,  orthostatic hypotension,   iron deficiency anemia,  GERD  presents today for follow-up of his coronary disease, chest pain.    Patient of Dr. Caryl Comes seen for DOD spot for neck pain  Reports he was moving mulch last week, developed pain in his jaw and neck 1 to 2 days later.  Reports that it hurts to touch on his neck and posterior shoulder area, hurts to turn his head Very fatigued  Has not done mold since then Better day today, less neck pain with movement of his head Concerned it might be his heart contributing to symptoms he  Echocardiogram February 2022 Ejection fraction 40 to 45%  Cardiac MRI May 2018 Ejection fraction 36%, moderate MR  Cardiac catheterization September 2018 Significant underlying 2 vessel coronary artery disease with widely patent stents in the right coronary artery and mid LAD. Second diagonal is jailed by the LAD stent with 70% ostial stenosis.  EKG personally reviewed by myself on todays visit Paced rhythm rate 60 bpm, baseline artifact noted, difficult to discern P waves  Pacer downloads reviewed, rare atrial tachycardia/SVT Atrial fibrillation noted on download November 2022  PMH:   has a past medical history of Arthritis, CAD (coronary artery disease), CLL (chronic lymphocytic leukemia) ([place]), Diverticulitis, GERD (gastroesophageal reflux disease), H/O Bell's palsy  (1960s), HFrEF (heart failure with reduced ejection fraction) ([place]), High cholesterol, History of kidney stones, HOH (hard of hearing), Iron deficiency anemia, Ischemic cardiomyopathy, LBBB (left bundle branch block), Mobitz type 2 second degree AV block (03/06/2017), Myocardial infarction ([place]) (01/27/2016), Presence of permanent cardiac pacemaker, Skin cancer, and Tachy-brady syndrome ([place]).  PSH:    Past Surgical History:  Procedure Laterality Date   BACK SURGERY     BI-VENTRICULAR PACEMAKER INSERTION (CRT-P)  03/06/2017   BIV PACEMAKER INSERTION CRT-P N/A 03/06/2017   Procedure: BIV PACEMAKER INSERTION CRT-P;  Surgeon: Evans Lance, MD;  Location: [facility];  Service: Cardiovascular;  Laterality: N/A;   CARDIAC CATHETERIZATION N/A 07/29/2014   Procedure: Left Heart Cath;  Surgeon: Teodoro Spray, MD;  Location: [facility];  Service: Cardiovascular;  Laterality: N/A;   CARDIAC CATHETERIZATION N/A 01/27/2016   Procedure: Left Heart Cath and Coronary Angiography;  Surgeon: [name], MD;  Location: [facility];  Service: Cardiovascular;  Laterality: N/A;   CARDIAC CATHETERIZATION N/A 01/27/2016   Procedure: Coronary Stent Intervention;  Surgeon: [name], MD;  Location: [facility];  Service: Cardiovascular;  Laterality: N/A;   CARDIAC CATHETERIZATION N/A 02/13/2016   Procedure: Coronary Stent Intervention;  Surgeon: [name], MD;  Location: [facility];  Service: Cardiovascular;  Laterality: N/A;   CARDIAC CATHETERIZATION N/A 02/13/2016   Procedure: Left Heart Cath and Coronary Angiography;  Surgeon: [name], MD;  Location: [facility]  IN[facility];  Service: Cardiovascular;  Laterality: N/A;   CARDIAC CATHETERIZATION     CATARACT EXTRACTION W/PHACO Right 07/18/2014   Procedure: CATARACT EXTRACTION PHACO AND INTRAOCULAR LENS PLACEMENT (IOC);  Surgeon: Estill Cotta, MD;  Location: [facility];  Service: Ophthalmology;  Laterality: Right;  [place] 03:11 AP%  28.4 CDE 75.64   CATARACT EXTRACTION W/PHACO Left 06/19/2015   Procedure: CATARACT EXTRACTION PHACO AND INTRAOCULAR LENS PLACEMENT (IOC);  Surgeon: Estill Cotta, MD;  Location: [facility];  Service: Ophthalmology;  Laterality: Left;  [place] 02:36.5 AP% 27.7 CDE 69.51 fluid pack lot # [id number]   COLONOSCOPY WITH PROPOFOL N/A 04/20/2018   Procedure: COLONOSCOPY WITH PROPOFOL;  Surgeon: Manya Silvas, MD;  Location: [facility];  Service: Endoscopy;  Laterality: N/A;   ESOPHAGOGASTRODUODENOSCOPY (EGD) WITH PROPOFOL N/A 04/20/2018   Procedure: ESOPHAGOGASTRODUODENOSCOPY (EGD) WITH PROPOFOL;  Surgeon: Manya Silvas, MD;  Location: [facility] ENDOSCOPY;  Service: Endoscopy;  Laterality: N/A;   EYE SURGERY     INGUINAL HERNIA REPAIR Left 2010   INSERT / REPLACE / REMOVE PACEMAKER     LEFT HEART CATH AND CORONARY ANGIOGRAPHY N/A 11/18/2016   Procedure: LEFT HEART CATH AND CORONARY ANGIOGRAPHY;  Surgeon: Wellington Hampshire, MD;  Location: [facility];  Service: Cardiovascular;  Laterality: N/A;   [place]   "ruptured disc; took a piece off my buttocks and fixed it"   SKIN CANCER EXCISION     "top of my head"   TONSILLECTOMY      Current Outpatient Medications  Medication Sig Dispense Refill   atorvastatin (LIPITOR) 80 MG tablet Take 1 tablet (80 mg total) by mouth every evening. 90 tablet 3   BRILINTA 60 MG TABS tablet TAKE ONE TABLET BY MOUTH TWICE DAILY 180 tablet 2   Calcium Carbonate-Vit D-Min (CALTRATE PLUS PO) Take by mouth 2 (two) times daily. Taking tablets  2 daily     carvedilol (COREG) 3.125 MG tablet TAKE 1 TABLET BY MOUTH TWICE DAILY 180 tablet 3   co-enzyme Q-10 30 MG capsule Take 30 mg by mouth daily.     Cyanocobalamin (B-12) 500 MCG SUBL Place 500 mcg under the tongue once a week.      empagliflozin (JARDIANCE) 10 MG TABS tablet Take 1 tablet (10 mg total) by mouth daily. 90 tablet 3   ferrous gluconate (FERGON) 324 MG tablet Takes 1 tablet PO twice  per week     lisinopril (PRINIVIL,ZESTRIL) 5 MG tablet Take 2.5 mg by mouth daily.     nitroGLYCERIN (NITROSTAT) 0.4 MG SL tablet Place 1 tablet (0.4 mg total) under the tongue every 5 (five) minutes x 3 doses as needed for chest pain. 25 tablet 3   pantoprazole (PROTONIX) 40 MG tablet Take 40 mg by mouth daily. Taking 1 tablet daily     spironolactone (ALDACTONE) 25 MG tablet TAKE ONE-HALF TABLET ONCE A DAY 45 tablet 2   carboxymethylcellulose (REFRESH PLUS) 0.5 % SOLN Place 1 drop into both eyes 3 (three) times daily as needed (for dry eyes).  (Patient not taking: Reported on 02/01/2021)     No current facility-administered medications for this visit.     Allergies:   Niaspan [niacin er] and Adhesive [tape]   Social History:  The patient  reports that he quit smoking about 63 years ago. His smoking use included pipe and cigars. He quit smokeless tobacco use about 63 years ago.  His smokeless tobacco use included chew. He reports that he does not  drink alcohol and does not use drugs.   Family History:   family history includes Heart failure in his father.    Review of Systems: Review of Systems  Constitutional: Negative.   HENT: Negative.    Respiratory: Negative.    Cardiovascular: Negative.   Gastrointestinal: Negative.   Musculoskeletal: Negative.   Neurological: Negative.   Psychiatric/Behavioral: Negative.    All other systems reviewed and are negative.   PHYSICAL EXAM: VS:  BP 100/60 (BP Location: Left Arm, Patient Position: Sitting, Cuff Size: Normal)    Pulse 60    Ht 5\' 9"  (1.753 m)    Wt 177 lb 8 oz (80.5 kg)    SpO2 98%    BMI 26.21 kg/m  , BMI Body mass index is 26.21 kg/m. GEN: Well nourished, well developed, in no acute distress HEENT: normal Neck: no JVD, carotid bruits, or masses Cardiac: RRR; no murmurs, rubs, or gallops,no edema  Respiratory:  clear to auscultation bilaterally, normal work of breathing GI: soft, nontender, nondistended, + BS MS: no deformity  or atrophy Skin: warm and dry, no rash Neuro:  Strength and sensation are intact Psych: euthymic mood, full affect   Recent Labs: 12/26/2020: BUN 12; Creatinine, Ser 1.09; Potassium 4.7; Sodium 141 01/31/2021: Hemoglobin 14.6; Platelets 215    Lipid Panel Lab Results  Component Value Date   CHOL 109 08/01/2016   HDL 32 (L) 08/01/2016   LDLCALC 62 08/01/2016   TRIG 73 08/01/2016      Wt Readings from Last 3 Encounters:  04/10/21 177 lb 8 oz (80.5 kg)  12/26/20 177 lb (80.3 kg)  03/28/20 184 lb (83.5 kg)       ASSESSMENT AND PLAN:  Problem List Items Addressed This Visit       Cardiology Problems   Paroxysmal atrial fibrillation (HCC)   Aortic atherosclerosis (HCC)   Dilated cardiomyopathy ([place])   Tachycardia-bradycardia syndrome ([place])   CAD (coronary artery disease), native coronary artery   Other Visit Diagnoses     Complete heart block (HCC)    -  Primary   Relevant Orders   EKG 12-Lead      Coronary disease with stable angina Atypical chest pain likely musculoskeletal in the neck after heavy wet mulching for 2 days Recommend he do his strenuous physical activity in moderation Symptoms seem to be getting better, somewhat reproducible on palpation of the neck and turning of the neck No ischemic work-up needed at this time  Paroxysmal atrial fibrillation Noted on pacer download November 2022, managed by Dr. Rich Brave to exclude atrial fibrillation on today's EKG, paced rhythm We will need to monitor closely on periodic pacer downloads, For higher burden of atrial fibrillation may need to consider NOAC  Pacer History of heart block, Followed by Dr. Caryl Comes  Cardiomyopathy Presumed to be ischemic, ejection fraction 40 to 45% in 2022 Appears euvolemic Given low blood pressure, unable to advance his medications Continue low-dose lisinopril, spironolactone, carvedilol    Total encounter time more than 40 minutes  Greater than 50% was spent in  counseling and coordination of care with the patient    Signed, Esmond Plants, M.D., Ph.D. [place], [place]

## 2021-04-10 NOTE — Telephone Encounter (Signed)
° °  Pt c/o of Chest Pain: STAT if CP now or developed within 24 hours  1. Are you having CP right now? NO NEVER CHEST PAIN patient reports previous card issues with same warning symptoms   2. Are you experiencing any other symptoms (ex. SOB, nausea, vomiting, sweating)?   Patient reporting pain in neck and jaw (lock jaw) . Fatigued or something off   3. How long have you been experiencing CP? This weekend   4. Is your CP continuous or coming and going? Continuous through weekend but feeling like self today   5. Have you taken Nitroglycerin? Yes Saturday and it did help  ?

## 2021-04-10 NOTE — Patient Instructions (Addendum)
Medication Instructions:  No changes  If you need a refill on your cardiac medications before your next appointment, please call your pharmacy.   Lab work: No new labs needed  Testing/Procedures: No new testing needed  Follow-Up: At [facility], you and your health needs are our priority.  As part of our continuing mission to provide you with exceptional heart care, we have created designated Provider Care Teams.  These Care Teams include your primary Cardiologist (physician) and Advanced Practice Providers (APPs -  Physician Assistants and Nurse Practitioners) who all work together to provide you with the care you need, when you need it.  You will need a follow up appointment in 12 months with Dr. Caryl Comes  Providers on your designated Care Team:   Murray Hodgkins, NP Christell Faith, PA-C Cadence Kathlen Mody, [place]  COVID-19 Vaccine Information can be found at: [email or web address] For questions related to vaccine distribution or appointments, please email [email or web address] or call [phone].

## 2021-04-10 NOTE — Telephone Encounter (Signed)
I spoke with the patient. He called to report symptoms of neck pain/ lock jaw, that he experienced Saturday and Sunday.  He does confirm that he moved mulch some last week and initially thought symptoms were related to this. However, he states he did not feel good at all on Saturday and Sunday.  He advised he feels more like himself today, but wanted to call to follow up on his symptoms.  The patient does have a history of an inferior MI with stenting to the RCA in 01/2016.  He reports that prior to his MI, he does recall having neck pain and lock jaw symptoms for several days prior to his event.   He did take 1 NTG over the weekend and this did resolve his symptoms.  The patient is currently asymptomatic, but with his history I have advised him that I feel an in office evaluation is recommended. The patient voices understanding and is agreeable.  He is aware Dr. Olin Pia schedule is full this morning, but he is agreeable with seeing Dr. Rockey Situ (DOD) at 3:20 pm today.   The patient is advised of ER precautions if his symptoms return prior to his scheduled appointment time.   He was very appreciative of the call back.

## 2021-04-11 DIAGNOSIS — I480 Paroxysmal atrial fibrillation: Secondary | ICD-10-CM | POA: Insufficient documentation

## 2021-04-13 ENCOUNTER — Encounter: Payer: Self-pay | Admitting: Cardiovascular Disease

## 2021-04-16 ENCOUNTER — Other Ambulatory Visit: Payer: Self-pay

## 2021-04-16 DIAGNOSIS — I2119 ST elevation (STEMI) myocardial infarction involving other coronary artery of inferior wall: Secondary | ICD-10-CM

## 2021-04-16 DIAGNOSIS — I2510 Atherosclerotic heart disease of native coronary artery without angina pectoris: Secondary | ICD-10-CM

## 2021-04-16 DIAGNOSIS — I495 Sick sinus syndrome: Secondary | ICD-10-CM

## 2021-04-16 DIAGNOSIS — R0789 Other chest pain: Secondary | ICD-10-CM

## 2021-04-26 ENCOUNTER — Encounter
Admission: RE | Admit: 2021-04-26 | Discharge: 2021-04-26 | Disposition: A | Discharge status: Home / Self Care | Payer: PPO | Source: Ambulatory Visit | Attending: Cardiovascular Disease | Admitting: Cardiovascular Disease

## 2021-04-26 ENCOUNTER — Other Ambulatory Visit: Payer: Self-pay

## 2021-04-26 DIAGNOSIS — I2510 Atherosclerotic heart disease of native coronary artery without angina pectoris: Secondary | ICD-10-CM | POA: Diagnosis not present

## 2021-04-26 DIAGNOSIS — R0789 Other chest pain: Secondary | ICD-10-CM | POA: Insufficient documentation

## 2021-04-26 DIAGNOSIS — I2119 ST elevation (STEMI) myocardial infarction involving other coronary artery of inferior wall: Secondary | ICD-10-CM

## 2021-04-26 DIAGNOSIS — I495 Sick sinus syndrome: Secondary | ICD-10-CM | POA: Insufficient documentation

## 2021-04-26 MED ORDER — TECHNETIUM TC 99M TETROFOSMIN IV KIT
30.0000 | PACK | Freq: Once | INTRAVENOUS | Status: AC | PRN
  Administered 2021-04-26: 31.68 via INTRAVENOUS

## 2021-04-26 MED ORDER — REGADENOSON 0.4 MG/5ML IV SOLN
0.4000 mg | Freq: Once | INTRAVENOUS | Status: AC
  Administered 2021-04-26: 0.4 mg via INTRAVENOUS
  Filled 2021-04-26: qty 5

## 2021-04-26 MED ORDER — TECHNETIUM TC 99M TETROFOSMIN IV KIT
10.0000 | PACK | Freq: Once | INTRAVENOUS | Status: AC | PRN
  Administered 2021-04-26: 10.7 via INTRAVENOUS

## 2021-04-27 DIAGNOSIS — M810 Age-related osteoporosis without current pathological fracture: Secondary | ICD-10-CM | POA: Diagnosis not present

## 2021-04-27 LAB — NM MYOCAR MULTI W/SPECT W/WALL MOTION / EF
LV dias vol: 117 mL (ref 62–150)
LV sys vol: 48 mL
MPHR: 136 {beats}/min
Nuc Stress EF: 59 %
Peak HR: 74 {beats}/min
Percent HR: 54 %
Rest HR: 60 {beats}/min
Rest Nuclear Isotope Dose: 10.7 mCi
SDS: 2
SRS: 12
SSS: 7
ST Depression (mm): 0 mm
Stress Nuclear Isotope Dose: 31.7 mCi
TID: 1.06

## 2021-04-30 ENCOUNTER — Telehealth: Payer: Self-pay

## 2021-04-30 ENCOUNTER — Other Ambulatory Visit: Payer: Self-pay | Admitting: Family

## 2021-04-30 NOTE — Telephone Encounter (Signed)
Rx(s) sent to pharmacy electronically.  

## 2021-04-30 NOTE — Telephone Encounter (Signed)
Able to reach pt regarding his recent stress test Dr. Rockey Situ had a chance to review his results and advised  ? ?"Stress test  ?He appears to be moderate size region old infarct/scar bottom part of the heart mid to distal inferior wall  ?Otherwise no new blockages indicated  ?If symptoms have improved from the time when he was shoveling mulch, no further procedures needed such as catheterization " ? ?Christopher Daniels very thankful for the phone call of his results, all questions and concerns were address with nothing further at this time. Will see at next schedule f/u appt.  ? ? ?

## 2021-06-06 ENCOUNTER — Ambulatory Visit (INDEPENDENT_AMBULATORY_CARE_PROVIDER_SITE_OTHER): Payer: PPO

## 2021-06-06 DIAGNOSIS — I442 Atrioventricular block, complete: Secondary | ICD-10-CM | POA: Diagnosis not present

## 2021-06-07 LAB — CUP PACEART REMOTE DEVICE CHECK
Battery Remaining Longevity: 19 mo
Battery Remaining Percentage: 27 %
Battery Voltage: 2.9 V
Brady Statistic AP VP Percent: 91 %
Brady Statistic AP VS Percent: 1 %
Brady Statistic AS VP Percent: 8.6 %
Brady Statistic AS VS Percent: 1 %
Brady Statistic RA Percent Paced: 91 %
Date Time Interrogation Session: 20230412053139
Implantable Lead Implant Date: 20190110
Implantable Lead Implant Date: 20190110
Implantable Lead Implant Date: 20190110
Implantable Lead Location: 753858
Implantable Lead Location: 753859
Implantable Lead Location: 753860
Implantable Pulse Generator Implant Date: 20190110
Lead Channel Impedance Value: 410 Ohm
Lead Channel Impedance Value: 450 Ohm
Lead Channel Impedance Value: 630 Ohm
Lead Channel Pacing Threshold Amplitude: 0.5 V
Lead Channel Pacing Threshold Amplitude: 0.75 V
Lead Channel Pacing Threshold Amplitude: 1.5 V
Lead Channel Pacing Threshold Pulse Width: 0.5 ms
Lead Channel Pacing Threshold Pulse Width: 0.5 ms
Lead Channel Pacing Threshold Pulse Width: 1.5 ms
Lead Channel Sensing Intrinsic Amplitude: 11.2 mV
Lead Channel Sensing Intrinsic Amplitude: 4.1 mV
Lead Channel Setting Pacing Amplitude: 2 V
Lead Channel Setting Pacing Amplitude: 2 V
Lead Channel Setting Pacing Amplitude: 2.5 V
Lead Channel Setting Pacing Pulse Width: 0.5 ms
Lead Channel Setting Pacing Pulse Width: 1.5 ms
Lead Channel Setting Sensing Sensitivity: 2 mV
Pulse Gen Model: 3262
Pulse Gen Serial Number: 8942222

## 2021-06-22 NOTE — Progress Notes (Signed)
Remote pacemaker transmission.   

## 2021-08-15 DIAGNOSIS — I700 Atherosclerosis of aorta: Secondary | ICD-10-CM | POA: Diagnosis not present

## 2021-08-15 DIAGNOSIS — I2510 Atherosclerotic heart disease of native coronary artery without angina pectoris: Secondary | ICD-10-CM | POA: Diagnosis not present

## 2021-08-15 DIAGNOSIS — R0789 Other chest pain: Secondary | ICD-10-CM | POA: Diagnosis not present

## 2021-08-22 ENCOUNTER — Other Ambulatory Visit: Payer: Self-pay | Admitting: Internal Medicine

## 2021-09-05 ENCOUNTER — Ambulatory Visit (INDEPENDENT_AMBULATORY_CARE_PROVIDER_SITE_OTHER): Payer: PPO

## 2021-09-05 DIAGNOSIS — I495 Sick sinus syndrome: Secondary | ICD-10-CM

## 2021-09-05 LAB — CUP PACEART REMOTE DEVICE CHECK
Battery Remaining Longevity: 14 mo
Battery Remaining Percentage: 23 %
Battery Voltage: 2.89 V
Brady Statistic AP VP Percent: 91 %
Brady Statistic AP VS Percent: 1 %
Brady Statistic AS VP Percent: 8.8 %
Brady Statistic AS VS Percent: 1 %
Brady Statistic RA Percent Paced: 90 %
Date Time Interrogation Session: 20230712020016
Implantable Lead Implant Date: 20190110
Implantable Lead Implant Date: 20190110
Implantable Lead Implant Date: 20190110
Implantable Lead Location: 753858
Implantable Lead Location: 753859
Implantable Lead Location: 753860
Implantable Pulse Generator Implant Date: 20190110
Lead Channel Impedance Value: 400 Ohm
Lead Channel Impedance Value: 410 Ohm
Lead Channel Impedance Value: 650 Ohm
Lead Channel Pacing Threshold Amplitude: 0.5 V
Lead Channel Pacing Threshold Amplitude: 0.75 V
Lead Channel Pacing Threshold Amplitude: 1.5 V
Lead Channel Pacing Threshold Pulse Width: 0.5 ms
Lead Channel Pacing Threshold Pulse Width: 0.5 ms
Lead Channel Pacing Threshold Pulse Width: 1.5 ms
Lead Channel Sensing Intrinsic Amplitude: 12 mV
Lead Channel Sensing Intrinsic Amplitude: 3.9 mV
Lead Channel Setting Pacing Amplitude: 2 V
Lead Channel Setting Pacing Amplitude: 2 V
Lead Channel Setting Pacing Amplitude: 2.5 V
Lead Channel Setting Pacing Pulse Width: 0.5 ms
Lead Channel Setting Pacing Pulse Width: 1.5 ms
Lead Channel Setting Sensing Sensitivity: 2 mV
Pulse Gen Model: 3262
Pulse Gen Serial Number: 8942222

## 2021-09-07 DIAGNOSIS — E782 Mixed hyperlipidemia: Secondary | ICD-10-CM | POA: Diagnosis not present

## 2021-09-07 DIAGNOSIS — R739 Hyperglycemia, unspecified: Secondary | ICD-10-CM | POA: Diagnosis not present

## 2021-09-07 DIAGNOSIS — E538 Deficiency of other specified B group vitamins: Secondary | ICD-10-CM | POA: Diagnosis not present

## 2021-09-10 DIAGNOSIS — E538 Deficiency of other specified B group vitamins: Secondary | ICD-10-CM | POA: Diagnosis not present

## 2021-09-10 DIAGNOSIS — Z125 Encounter for screening for malignant neoplasm of prostate: Secondary | ICD-10-CM | POA: Diagnosis not present

## 2021-09-10 DIAGNOSIS — E782 Mixed hyperlipidemia: Secondary | ICD-10-CM | POA: Diagnosis not present

## 2021-09-10 DIAGNOSIS — I420 Dilated cardiomyopathy: Secondary | ICD-10-CM | POA: Diagnosis not present

## 2021-09-10 DIAGNOSIS — R739 Hyperglycemia, unspecified: Secondary | ICD-10-CM | POA: Diagnosis not present

## 2021-09-10 DIAGNOSIS — C9110 Chronic lymphocytic leukemia of B-cell type not having achieved remission: Secondary | ICD-10-CM | POA: Diagnosis not present

## 2021-09-26 NOTE — Progress Notes (Signed)
Remote pacemaker transmission.   

## 2021-10-04 ENCOUNTER — Emergency Department: Payer: PPO

## 2021-10-04 ENCOUNTER — Other Ambulatory Visit: Payer: Self-pay

## 2021-10-04 ENCOUNTER — Emergency Department
Admission: EM | Admit: 2021-10-04 | Discharge: 2021-10-04 | Discharge status: Against Medical Advice | Payer: PPO | Attending: Emergency Medicine | Admitting: Emergency Medicine

## 2021-10-04 DIAGNOSIS — R0902 Hypoxemia: Secondary | ICD-10-CM | POA: Diagnosis not present

## 2021-10-04 DIAGNOSIS — R1013 Epigastric pain: Secondary | ICD-10-CM | POA: Diagnosis not present

## 2021-10-04 DIAGNOSIS — R079 Chest pain, unspecified: Secondary | ICD-10-CM | POA: Diagnosis not present

## 2021-10-04 DIAGNOSIS — R1110 Vomiting, unspecified: Secondary | ICD-10-CM | POA: Diagnosis not present

## 2021-10-04 DIAGNOSIS — R52 Pain, unspecified: Secondary | ICD-10-CM | POA: Diagnosis not present

## 2021-10-04 DIAGNOSIS — Z5321 Procedure and treatment not carried out due to patient leaving prior to being seen by health care provider: Secondary | ICD-10-CM | POA: Insufficient documentation

## 2021-10-04 DIAGNOSIS — I959 Hypotension, unspecified: Secondary | ICD-10-CM | POA: Diagnosis not present

## 2021-10-04 DIAGNOSIS — R0789 Other chest pain: Secondary | ICD-10-CM | POA: Diagnosis not present

## 2021-10-04 LAB — CBC
HCT: 42.7 % (ref 39.0–52.0)
Hemoglobin: 14.1 g/dL (ref 13.0–17.0)
MCH: 32.5 pg (ref 26.0–34.0)
MCHC: 33 g/dL (ref 30.0–36.0)
MCV: 98.4 fL (ref 80.0–100.0)
Platelets: 213 10*3/uL (ref 150–400)
RBC: 4.34 MIL/uL (ref 4.22–5.81)
RDW: 13.4 % (ref 11.5–15.5)
WBC: 13.8 10*3/uL — ABNORMAL HIGH (ref 4.0–10.5)
nRBC: 0 % (ref 0.0–0.2)

## 2021-10-04 LAB — BASIC METABOLIC PANEL
Anion gap: 7 (ref 5–15)
BUN: 18 mg/dL (ref 8–23)
CO2: 25 mmol/L (ref 22–32)
Calcium: 9.1 mg/dL (ref 8.9–10.3)
Chloride: 106 mmol/L (ref 98–111)
Creatinine, Ser: 1.19 mg/dL (ref 0.61–1.24)
GFR, Estimated: 60 mL/min — ABNORMAL LOW (ref >60)
Glucose, Bld: 111 mg/dL — ABNORMAL HIGH (ref 70–99)
Potassium: 4.3 mmol/L (ref 3.5–5.1)
Sodium: 138 mmol/L (ref 135–145)

## 2021-10-04 LAB — TROPONIN I (HIGH SENSITIVITY): Troponin I (High Sensitivity): 10 ng/L (ref <18)

## 2021-10-04 NOTE — ED Triage Notes (Signed)
Pt arrives today with chest pain and a history of MI. Pt on brillenta (blood thinner). Pain in upper abdomen and radiating upwards. Pt was given nitro x2 with no relief. Pt given third nitro enroute and 4 asa. Pt stating pain has since subsided.

## 2021-10-04 NOTE — ED Provider Triage Note (Signed)
Emergency Medicine Provider Triage Evaluation Note  Christopher Daniels , a 87 y.o. male  was evaluated in triage.  Pt complains of chest pain that began around 130.  Sudden onset.  He describes pressure in his epigastric area extending up into his chest, he had 1 episode of vomiting and symptoms improved.  He has a history of MI, took nitro at home and called EMS.  Currently patient states he is asymptomatic.  No abdominal pain chest pain shortness of breath or back pain.  Review of Systems  Positive: Chest pain, abdominal pain, vomiting x 1 Negative: Fevers, back pain  Physical Exam  BP (!) 108/56 (BP Location: Right Arm)   Pulse (!) 59   Temp 97.7 F (36.5 C)   Resp 17   SpO2 100%  Gen:   Awake, no distress   Resp:  Normal effort  MSK:   Moves extremities without difficulty  Other:  Abdomen soft nontender nondistended.  Medical Decision Making  Medically screening exam initiated at 5:51 PM.  Appropriate orders placed.  KWAN SHELLHAMMER was informed that the remainder of the evaluation will be completed by another provider, this initial triage assessment does not replace that evaluation, and the importance of remaining in the ED until their evaluation is complete.     Duanne Guess, PA-C 10/04/21 1753

## 2021-10-04 NOTE — ED Notes (Addendum)
Pt to ED via [facility] from home for chest pain. Pt has hx/o MI 5 years ago. Pt has a pacemaker. Pt took 1 Nitro prior to EMS arrival. Pt was given 1 spray with Nitro by ems and 324 mg of Aspirin. Pt has 18 G IV in [place]. EKG showed paced rhythm.

## 2021-10-05 ENCOUNTER — Encounter: Payer: Self-pay | Admitting: Internal Medicine

## 2021-10-25 DIAGNOSIS — M545 Low back pain, unspecified: Secondary | ICD-10-CM | POA: Diagnosis not present

## 2021-11-22 ENCOUNTER — Other Ambulatory Visit: Payer: Self-pay | Admitting: Internal Medicine

## 2021-12-05 ENCOUNTER — Ambulatory Visit (INDEPENDENT_AMBULATORY_CARE_PROVIDER_SITE_OTHER): Payer: PPO

## 2021-12-05 DIAGNOSIS — I495 Sick sinus syndrome: Secondary | ICD-10-CM

## 2021-12-05 LAB — CUP PACEART REMOTE DEVICE CHECK
Battery Remaining Longevity: 12 mo
Battery Remaining Percentage: 18 %
Battery Voltage: 2.87 V
Brady Statistic AP VP Percent: 91 %
Brady Statistic AP VS Percent: 1 %
Brady Statistic AS VP Percent: 8.6 %
Brady Statistic AS VS Percent: 1 %
Brady Statistic RA Percent Paced: 91 %
Date Time Interrogation Session: 20231011020015
Implantable Lead Implant Date: 20190110
Implantable Lead Implant Date: 20190110
Implantable Lead Implant Date: 20190110
Implantable Lead Location: 753858
Implantable Lead Location: 753859
Implantable Lead Location: 753860
Implantable Pulse Generator Implant Date: 20190110
Lead Channel Impedance Value: 430 Ohm
Lead Channel Impedance Value: 430 Ohm
Lead Channel Impedance Value: 690 Ohm
Lead Channel Pacing Threshold Amplitude: 0.5 V
Lead Channel Pacing Threshold Amplitude: 0.75 V
Lead Channel Pacing Threshold Amplitude: 1.5 V
Lead Channel Pacing Threshold Pulse Width: 0.5 ms
Lead Channel Pacing Threshold Pulse Width: 0.5 ms
Lead Channel Pacing Threshold Pulse Width: 1.5 ms
Lead Channel Sensing Intrinsic Amplitude: 12 mV
Lead Channel Sensing Intrinsic Amplitude: 4.2 mV
Lead Channel Setting Pacing Amplitude: 2 V
Lead Channel Setting Pacing Amplitude: 2 V
Lead Channel Setting Pacing Amplitude: 2.5 V
Lead Channel Setting Pacing Pulse Width: 0.5 ms
Lead Channel Setting Pacing Pulse Width: 1.5 ms
Lead Channel Setting Sensing Sensitivity: 2 mV
Pulse Gen Model: 3262
Pulse Gen Serial Number: 8942222

## 2021-12-10 DIAGNOSIS — Z23 Encounter for immunization: Secondary | ICD-10-CM | POA: Diagnosis not present

## 2021-12-19 NOTE — Progress Notes (Signed)
Remote pacemaker transmission.   

## 2021-12-31 DIAGNOSIS — D2271 Melanocytic nevi of right lower limb, including hip: Secondary | ICD-10-CM | POA: Diagnosis not present

## 2021-12-31 DIAGNOSIS — Z85828 Personal history of other malignant neoplasm of skin: Secondary | ICD-10-CM | POA: Diagnosis not present

## 2021-12-31 DIAGNOSIS — L570 Actinic keratosis: Secondary | ICD-10-CM | POA: Diagnosis not present

## 2021-12-31 DIAGNOSIS — X32XXXA Exposure to sunlight, initial encounter: Secondary | ICD-10-CM | POA: Diagnosis not present

## 2021-12-31 DIAGNOSIS — D2261 Melanocytic nevi of right upper limb, including shoulder: Secondary | ICD-10-CM | POA: Diagnosis not present

## 2021-12-31 DIAGNOSIS — D2262 Melanocytic nevi of left upper limb, including shoulder: Secondary | ICD-10-CM | POA: Diagnosis not present

## 2022-01-07 ENCOUNTER — Other Ambulatory Visit: Payer: Self-pay | Admitting: Internal Medicine

## 2022-01-16 DIAGNOSIS — R634 Abnormal weight loss: Secondary | ICD-10-CM | POA: Diagnosis not present

## 2022-01-16 DIAGNOSIS — R1084 Generalized abdominal pain: Secondary | ICD-10-CM | POA: Diagnosis not present

## 2022-01-29 ENCOUNTER — Telehealth: Payer: Self-pay | Admitting: Internal Medicine

## 2022-01-29 ENCOUNTER — Encounter: Payer: Self-pay | Admitting: Internal Medicine

## 2022-01-29 MED ORDER — EMPAGLIFLOZIN 10 MG PO TABS: 10.0000 mg | ORAL_TABLET | Freq: Every day | ORAL | 0 refills | Status: DC

## 2022-01-29 NOTE — Telephone Encounter (Signed)
*  STAT* If patient is at the pharmacy, call can be transferred to refill team.   1. Which medications need to be refilled? (please list name of each medication and dose if known)   empagliflozin (JARDIANCE) 10 MG TABS tablet    2. Which pharmacy/location (including street and city if local pharmacy) is medication to be sent to?  [address], [place] [phone] 3. Do they need a 30 day or 90 day supply?  90 day

## 2022-01-29 NOTE — Telephone Encounter (Signed)
Christopher Daniels     01/29/22  2:31 PM Note Spoke with patient and advised him that as soon as we get Dr. Caryl Comes to sign the prescription someone from our office will call him to pick it up.

## 2022-01-29 NOTE — Telephone Encounter (Signed)
Spoke with patient and advised him that as soon as we get Dr. Caryl Comes to sign the prescription someone from our office will call him to pick it up.

## 2022-01-30 DIAGNOSIS — M810 Age-related osteoporosis without current pathological fracture: Secondary | ICD-10-CM | POA: Diagnosis not present

## 2022-02-01 NOTE — Telephone Encounter (Signed)
I called and spoke with the patient. I advised him that his RX for Christopher Daniels is ready for pick up.

## 2022-02-05 ENCOUNTER — Inpatient Hospital Stay: Payer: PPO | Attending: Oncology

## 2022-02-05 ENCOUNTER — Other Ambulatory Visit: Payer: Self-pay | Admitting: *Deleted

## 2022-02-05 DIAGNOSIS — Z862 Personal history of diseases of the blood and blood-forming organs and certain disorders involving the immune mechanism: Secondary | ICD-10-CM | POA: Diagnosis not present

## 2022-02-05 DIAGNOSIS — D509 Iron deficiency anemia, unspecified: Secondary | ICD-10-CM

## 2022-02-05 DIAGNOSIS — C9110 Chronic lymphocytic leukemia of B-cell type not having achieved remission: Secondary | ICD-10-CM

## 2022-02-05 DIAGNOSIS — Z79899 Other long term (current) drug therapy: Secondary | ICD-10-CM | POA: Insufficient documentation

## 2022-02-05 LAB — COMPREHENSIVE METABOLIC PANEL
ALT: 16 U/L (ref 0–44)
AST: 20 U/L (ref 15–41)
Albumin: 3.6 g/dL (ref 3.5–5.0)
Alkaline Phosphatase: 65 U/L (ref 38–126)
Anion gap: 8 (ref 5–15)
BUN: 17 mg/dL (ref 8–23)
CO2: 26 mmol/L (ref 22–32)
Calcium: 8.5 mg/dL — ABNORMAL LOW (ref 8.9–10.3)
Chloride: 104 mmol/L (ref 98–111)
Creatinine, Ser: 1.08 mg/dL (ref 0.61–1.24)
GFR, Estimated: >60 mL/min (ref >60)
Glucose, Bld: 103 mg/dL — ABNORMAL HIGH (ref 70–99)
Potassium: 4.3 mmol/L (ref 3.5–5.1)
Sodium: 138 mmol/L (ref 135–145)
Total Bilirubin: 0.7 mg/dL (ref 0.3–1.2)
Total Protein: 6.4 g/dL — ABNORMAL LOW (ref 6.5–8.1)

## 2022-02-05 LAB — CBC WITH DIFFERENTIAL/PLATELET
Abs Immature Granulocytes: 0.03 10*3/uL (ref 0.00–0.07)
Basophils Absolute: 0.1 10*3/uL (ref 0.0–0.1)
Basophils Relative: 1 %
Eosinophils Absolute: 0.1 10*3/uL (ref 0.0–0.5)
Eosinophils Relative: 1 %
HCT: 42.7 % (ref 39.0–52.0)
Hemoglobin: 14.2 g/dL (ref 13.0–17.0)
Immature Granulocytes: 0 %
Lymphocytes Relative: 61 %
Lymphs Abs: 5.5 10*3/uL — ABNORMAL HIGH (ref 0.7–4.0)
MCH: 32.8 pg (ref 26.0–34.0)
MCHC: 33.3 g/dL (ref 30.0–36.0)
MCV: 98.6 fL (ref 80.0–100.0)
Monocytes Absolute: 0.8 10*3/uL (ref 0.1–1.0)
Monocytes Relative: 8 %
Neutro Abs: 2.6 10*3/uL (ref 1.7–7.7)
Neutrophils Relative %: 29 %
Platelets: 214 10*3/uL (ref 150–400)
RBC: 4.33 MIL/uL (ref 4.22–5.81)
RDW: 13.4 % (ref 11.5–15.5)
WBC: 9.1 10*3/uL (ref 4.0–10.5)
nRBC: 0 % (ref 0.0–0.2)

## 2022-02-05 LAB — IRON AND TIBC
Iron: 124 ug/dL (ref 45–182)
Saturation Ratios: 51 % — ABNORMAL HIGH (ref 17.9–39.5)
TIBC: 245 ug/dL — ABNORMAL LOW (ref 250–450)
UIBC: 121 ug/dL

## 2022-02-05 LAB — FERRITIN: Ferritin: 49 ng/mL (ref 24–336)

## 2022-02-07 ENCOUNTER — Encounter: Payer: Self-pay | Admitting: Oncology

## 2022-02-07 ENCOUNTER — Inpatient Hospital Stay (HOSPITAL_BASED_OUTPATIENT_CLINIC_OR_DEPARTMENT_OTHER): Payer: PPO | Admitting: Oncology

## 2022-02-07 DIAGNOSIS — C9110 Chronic lymphocytic leukemia of B-cell type not having achieved remission: Secondary | ICD-10-CM | POA: Diagnosis not present

## 2022-02-07 DIAGNOSIS — D509 Iron deficiency anemia, unspecified: Secondary | ICD-10-CM

## 2022-02-07 NOTE — Progress Notes (Signed)
[place]  [phone] [phone]  ID: Christopher Daniels OB: August 18, 1934  MR#: [id number]  EXN#:[id number]  Patient Care Team: Rusty Aus, MD as PCP - General (Internal Medicine) Deboraha Sprang, MD as PCP - Cardiology (Cardiology) Lloyd Huger, MD as Consulting Physician (Oncology)  I connected with Christopher Daniels on 02/07/22 at  2:45 PM EST by video enabled telemedicine visit and verified that I am speaking with the correct person using two identifiers.   I discussed the limitations, risks, security and privacy concerns of performing an evaluation and management service by telemedicine and the availability of in-person appointments. I also discussed with the patient that there may be a patient responsible charge related to this service. The patient expressed understanding and agreed to proceed.   Other persons participating in the visit and their role in the encounter: Patient, MD.  Patient's location: Home. Provider's location: Clinic.  CHIEF COMPLAINT: CLL, iron deficiency anemia.  INTERVAL HISTORY: Patient agreed to video assisted telemedicine visit for discussion of his laboratory work and routine yearly evaluation.  He continues to feel well and remains asymptomatic.  He does not complain of any weakness or fatigue.  He has no neurologic complaints.  He has no fevers or night sweats.  He has a good appetite and denies weight loss. He denies any chest pain, cough, hemoptysis, or shortness of breath.  He denies any nausea, vomiting, constipation, or diarrhea.  He has no melena or hematochezia.  He has no urinary complaints.  Patient offers no specific complaints today.  REVIEW OF SYSTEMS:   Review of Systems  Constitutional: Negative.  Negative for fever, malaise/fatigue and weight loss.  Respiratory: Negative.  Negative for cough and shortness of breath.   Cardiovascular: Negative.  Negative for chest pain and leg swelling.   Gastrointestinal: Negative.  Negative for abdominal pain, blood in stool, constipation and melena.  Genitourinary: Negative.  Negative for dysuria and hematuria.  Musculoskeletal: Negative.  Negative for back pain.  Skin: Negative.  Negative for rash.  Neurological: Negative.  Negative for sensory change, focal weakness and weakness.  Endo/Heme/Allergies:  Does not bruise/bleed easily.  Psychiatric/Behavioral: Negative.  The patient is not nervous/anxious.     As per HPI. Otherwise, a complete review of systems is negative.  PAST MEDICAL HISTORY: Past Medical History:  Diagnosis Date   Arthritis    "joints; fingers, knees" (03/06/2017)   CAD (coronary artery disease)    a. 01/2016 Inf STEMI->PCI/Thrombectomy/DES to mid RCA (4.0 x 30 Resolute DES), EF 25-30%; b. 01/2016 Staged PCI/DES to mLAD (3.0x22 Resolute DES); c. 10/2016 Cath: LM nl, LAD patent stent, 30d, D2 70, LCX nl, OM1/2 nl, RCA patent mid/dist stent.   CLL (chronic lymphocytic leukemia) ([place])    "never taken any RX; just monitor this twice/year" (03/06/2017)   Diverticulitis    GERD (gastroesophageal reflux disease)    H/O Bell's palsy 1960s   HFrEF (heart failure with reduced ejection fraction) (HCC)    High cholesterol    History of kidney stones    HOH (hard of hearing)    Iron deficiency anemia    Ischemic cardiomyopathy    a. 01/2016 EF 25-30%; b. 04/2016 f/u echo: EF 36%, mild glob HK, basal and mid inflat, inf, infsept, mid apical inf AK, mildly dil LA/RA, mild AI/MR, trace TR; c. 06/2016 Cardiac MRI: EF 36%, apical AK, mod diff HK, septal-lateral dyssynchrony   LBBB (left bundle branch block)    Mobitz type 2  second degree AV block 03/06/2017   Myocardial infarction ([place]) 01/27/2016   Presence of permanent cardiac pacemaker    Skin cancer    a. on head years ago   Tachy-brady syndrome ([place])    a. 02/2017 s/p SJM 3262 Quadra Allur MP CRT-P.    PAST SURGICAL HISTORY: Past Surgical History:  Procedure Laterality  Date   BACK SURGERY     BI-VENTRICULAR PACEMAKER INSERTION (CRT-P)  03/06/2017   BIV PACEMAKER INSERTION CRT-P N/A 03/06/2017   Procedure: BIV PACEMAKER INSERTION CRT-P;  Surgeon: Evans Lance, MD;  Location: [place] [facility];  Service: Cardiovascular;  Laterality: N/A;   CARDIAC CATHETERIZATION N/A 07/29/2014   Procedure: Left Heart Cath;  Surgeon: Teodoro Spray, MD;  Location: [place] [facility];  Service: Cardiovascular;  Laterality: N/A;   CARDIAC CATHETERIZATION N/A 01/27/2016   Procedure: Left Heart Cath and Coronary Angiography;  Surgeon: [name], MD;  Location: [place] [facility];  Service: Cardiovascular;  Laterality: N/A;   CARDIAC CATHETERIZATION N/A 01/27/2016   Procedure: Coronary Stent Intervention;  Surgeon: [name], MD;  Location: [place] [facility];  Service: Cardiovascular;  Laterality: N/A;   CARDIAC CATHETERIZATION N/A 02/13/2016   Procedure: Coronary Stent Intervention;  Surgeon: [name], MD;  Location: [place] [facility];  Service: Cardiovascular;  Laterality: N/A;   CARDIAC CATHETERIZATION N/A 02/13/2016   Procedure: Left Heart Cath and Coronary Angiography;  Surgeon: [name], MD;  Location: [place] [facility];  Service: Cardiovascular;  Laterality: N/A;   CARDIAC CATHETERIZATION     CATARACT EXTRACTION W/PHACO Right 07/18/2014   Procedure: CATARACT EXTRACTION PHACO AND INTRAOCULAR LENS PLACEMENT (IOC);  Surgeon: Estill Cotta, MD;  Location: [facility];  Service: Ophthalmology;  Laterality: Right;  [place] 03:11 AP% 28.4 CDE 75.64   CATARACT EXTRACTION W/PHACO Left 06/19/2015   Procedure: CATARACT EXTRACTION PHACO AND INTRAOCULAR LENS PLACEMENT (IOC);  Surgeon: Estill Cotta, MD;  Location: [facility];  Service: Ophthalmology;  Laterality: Left;  [place] 02:36.5 AP% 27.7 CDE 69.51 fluid pack lot # [id number]   COLONOSCOPY WITH PROPOFOL N/A 04/20/2018   Procedure: COLONOSCOPY WITH PROPOFOL;  Surgeon: Manya Silvas, MD;  Location: [facility];  Service:  Endoscopy;  Laterality: N/A;   ESOPHAGOGASTRODUODENOSCOPY (EGD) WITH PROPOFOL N/A 04/20/2018   Procedure: ESOPHAGOGASTRODUODENOSCOPY (EGD) WITH PROPOFOL;  Surgeon: Manya Silvas, MD;  Location: [facility];  Service: Endoscopy;  Laterality: N/A;   EYE SURGERY     INGUINAL HERNIA REPAIR Left 2010   INSERT / REPLACE / REMOVE PACEMAKER     LEFT HEART CATH AND CORONARY ANGIOGRAPHY N/A 11/18/2016   Procedure: LEFT HEART CATH AND CORONARY ANGIOGRAPHY;  Surgeon: Wellington Hampshire, MD;  Location: [place] [facility];  Service: Cardiovascular;  Laterality: N/A;   [place]   "ruptured disc; took a piece off my buttocks and fixed it"   SKIN CANCER EXCISION     "top of my head"   TONSILLECTOMY      FAMILY HISTORY: Reviewed and unchanged. No reported history of malignancy or chronic disease.     ADVANCED DIRECTIVES:    HEALTH MAINTENANCE: Social History   Tobacco Use   Smoking status: Former    Types: Pipe, Cigars    Quit date: 1960    Years since quitting: 63.9   Smokeless tobacco: Former    Types: Chew    Quit date: 1960  Vaping Use   Vaping Use: Never used  Substance Use Topics   Alcohol  use: No   Drug use: Never     Colonoscopy:  PAP:  Bone density:  Lipid panel:  Allergies  Allergen Reactions   Niaspan [Niacin Er] Anaphylaxis and Other (See Comments)    Episode happened 10 to 15 years ago.  Has not tried any other similar meds since   Adhesive [Tape] Other (See Comments)    Skin is very sensitive; PLEASE USE PAPER TAPE!!    Current Outpatient Medications  Medication Sig Dispense Refill   atorvastatin (LIPITOR) 80 MG tablet Take 1 tablet (80 mg total) by mouth every evening. 90 tablet 3   BRILINTA 60 MG TABS tablet TAKE ONE TABLET BY MOUTH TWICE DAILY 180 tablet 2   Calcium Carbonate-Vit D-Min (CALTRATE PLUS PO) Take by mouth 2 (two) times daily. Taking tablets  2 daily     carboxymethylcellulose (REFRESH PLUS) 0.5 % SOLN Place 1 drop into both  eyes 3 (three) times daily as needed (for dry eyes).  (Patient not taking: Reported on 02/01/2021)     carvedilol (COREG) 3.125 MG tablet TAKE 1 TABLET BY MOUTH TWICE DAILY 180 tablet 1   co-enzyme Q-10 30 MG capsule Take 30 mg by mouth daily.     Cyanocobalamin (B-12) 500 MCG SUBL Place 500 mcg under the tongue once a week.      empagliflozin (JARDIANCE) 10 MG TABS tablet Take 1 tablet (10 mg total) by mouth daily. 90 tablet 0   ferrous gluconate (FERGON) 324 MG tablet Takes 1 tablet PO twice per week     lisinopril (PRINIVIL,ZESTRIL) 5 MG tablet Take 2.5 mg by mouth daily.     nitroGLYCERIN (NITROSTAT) 0.4 MG SL tablet Place 1 tablet (0.4 mg total) under the tongue every 5 (five) minutes x 3 doses as needed for chest pain. 25 tablet 3   pantoprazole (PROTONIX) 40 MG tablet Take 40 mg by mouth daily. Taking 1 tablet daily     spironolactone (ALDACTONE) 25 MG tablet TAKE ONE-HALF TABLET ONCE A DAY 45 tablet 3   No current facility-administered medications for this visit.    OBJECTIVE: There were no vitals filed for this visit.   There is no height or weight on file to calculate BMI.    ECOG FS:0 - Asymptomatic  General: Well-developed, well-nourished, no acute distress. HEENT: Normocephalic. Neuro: Alert, answering all questions appropriately. Cranial nerves grossly intact. Psych: Normal affect.  LAB RESULTS:  Lab Results  Component Value Date   NA 138 02/05/2022   K 4.3 02/05/2022   CL 104 02/05/2022   CO2 26 02/05/2022   GLUCOSE 103 (H) 02/05/2022   BUN 17 02/05/2022   CREATININE 1.08 02/05/2022   CALCIUM 8.5 (L) 02/05/2022   PROT 6.4 (L) 02/05/2022   ALBUMIN 3.6 02/05/2022   AST 20 02/05/2022   ALT 16 02/05/2022   ALKPHOS 65 02/05/2022   BILITOT 0.7 02/05/2022   GFRNONAA >60 02/05/2022   GFRAA >60 12/11/2017    Lab Results  Component Value Date   WBC 9.1 02/05/2022   NEUTROABS 2.6 02/05/2022   HGB 14.2 02/05/2022   HCT 42.7 02/05/2022   MCV 98.6 02/05/2022   PLT  214 02/05/2022   Lab Results  Component Value Date   IRON 124 02/05/2022   TIBC 245 (L) 02/05/2022   IRONPCTSAT 51 (H) 02/05/2022   Lab Results  Component Value Date   FERRITIN 49 02/05/2022     STUDIES: No results found.  ASSESSMENT: CLL, Rai stage 0.  Iron deficiency anemia.  PLAN:  1.  CLL: Patient's white blood cell count continues to be within normal limits today at 9.1.  Since January 2014 is count has ranged from 7.4-21.9. Previously, flow cytometry confirmed a monoclonal B-cell population consistent with CLL. CT in August 2014 was reviewed independently and did not reveal any underlying lymphadenopathy.  No intervention is needed.  Patient does not require additional imaging or a bone marrow biopsy unless there is suspicion of progression of disease.  Return to clinic in 1 year with repeat laboratory work and video assisted telemedicine visit. 2.  Iron deficiency anemia: Resolved.  Patient's hemoglobin and iron stores continue to be within normal limits.  Colonoscopy and EGD on April 20, 2018 did not reveal any distinct pathology or evidence of bleeding.  Patient does not require additional IV Feraheme.  He last received treatment on May 26, 2018.  Continue oral iron supplementation.  Return to clinic in 1 year as above.  I provided 20 minutes of face-to-face video visit time during this encounter which included chart review, counseling, and coordination of care as documented above.    Patient expressed understanding and was in agreement with this plan. He also understands that He can call clinic at any time with any questions, concerns, or complaints.    Lloyd Huger, MD   02/07/2022 3:15 PM

## 2022-03-06 ENCOUNTER — Ambulatory Visit (INDEPENDENT_AMBULATORY_CARE_PROVIDER_SITE_OTHER): Payer: PPO

## 2022-03-06 DIAGNOSIS — I495 Sick sinus syndrome: Secondary | ICD-10-CM

## 2022-03-06 LAB — CUP PACEART REMOTE DEVICE CHECK
Battery Remaining Longevity: 9 mo
Battery Remaining Percentage: 14 %
Battery Voltage: 2.84 V
Brady Statistic AP VP Percent: 91 %
Brady Statistic AP VS Percent: 1 %
Brady Statistic AS VP Percent: 8.5 %
Brady Statistic AS VS Percent: 1 %
Brady Statistic RA Percent Paced: 91 %
Date Time Interrogation Session: 20240110020013
Implantable Lead Connection Status: 753985
Implantable Lead Connection Status: 753985
Implantable Lead Connection Status: 753985
Implantable Lead Implant Date: 20190110
Implantable Lead Implant Date: 20190110
Implantable Lead Implant Date: 20190110
Implantable Lead Location: 753858
Implantable Lead Location: 753859
Implantable Lead Location: 753860
Implantable Pulse Generator Implant Date: 20190110
Lead Channel Impedance Value: 400 Ohm
Lead Channel Impedance Value: 410 Ohm
Lead Channel Impedance Value: 660 Ohm
Lead Channel Pacing Threshold Amplitude: 0.5 V
Lead Channel Pacing Threshold Amplitude: 0.75 V
Lead Channel Pacing Threshold Amplitude: 1.5 V
Lead Channel Pacing Threshold Pulse Width: 0.5 ms
Lead Channel Pacing Threshold Pulse Width: 0.5 ms
Lead Channel Pacing Threshold Pulse Width: 1.5 ms
Lead Channel Sensing Intrinsic Amplitude: 12 mV
Lead Channel Sensing Intrinsic Amplitude: 4.2 mV
Lead Channel Setting Pacing Amplitude: 2 V
Lead Channel Setting Pacing Amplitude: 2 V
Lead Channel Setting Pacing Amplitude: 2.5 V
Lead Channel Setting Pacing Pulse Width: 0.5 ms
Lead Channel Setting Pacing Pulse Width: 1.5 ms
Lead Channel Setting Sensing Sensitivity: 2 mV
Pulse Gen Model: 3262
Pulse Gen Serial Number: 8942222

## 2022-03-07 DIAGNOSIS — R739 Hyperglycemia, unspecified: Secondary | ICD-10-CM | POA: Diagnosis not present

## 2022-03-07 DIAGNOSIS — E782 Mixed hyperlipidemia: Secondary | ICD-10-CM | POA: Diagnosis not present

## 2022-03-07 DIAGNOSIS — M810 Age-related osteoporosis without current pathological fracture: Secondary | ICD-10-CM | POA: Diagnosis not present

## 2022-03-07 DIAGNOSIS — Z125 Encounter for screening for malignant neoplasm of prostate: Secondary | ICD-10-CM | POA: Diagnosis not present

## 2022-03-07 DIAGNOSIS — E538 Deficiency of other specified B group vitamins: Secondary | ICD-10-CM | POA: Diagnosis not present

## 2022-03-14 DIAGNOSIS — Z0000 Encounter for general adult medical examination without abnormal findings: Secondary | ICD-10-CM | POA: Diagnosis not present

## 2022-03-14 DIAGNOSIS — M542 Cervicalgia: Secondary | ICD-10-CM | POA: Diagnosis not present

## 2022-03-14 DIAGNOSIS — E538 Deficiency of other specified B group vitamins: Secondary | ICD-10-CM | POA: Diagnosis not present

## 2022-03-14 DIAGNOSIS — I25118 Atherosclerotic heart disease of native coronary artery with other forms of angina pectoris: Secondary | ICD-10-CM | POA: Diagnosis not present

## 2022-03-14 DIAGNOSIS — I420 Dilated cardiomyopathy: Secondary | ICD-10-CM | POA: Diagnosis not present

## 2022-03-14 DIAGNOSIS — E782 Mixed hyperlipidemia: Secondary | ICD-10-CM | POA: Diagnosis not present

## 2022-03-14 DIAGNOSIS — I495 Sick sinus syndrome: Secondary | ICD-10-CM | POA: Diagnosis not present

## 2022-03-14 DIAGNOSIS — C9110 Chronic lymphocytic leukemia of B-cell type not having achieved remission: Secondary | ICD-10-CM | POA: Diagnosis not present

## 2022-03-14 DIAGNOSIS — I700 Atherosclerosis of aorta: Secondary | ICD-10-CM | POA: Diagnosis not present

## 2022-03-28 NOTE — Progress Notes (Signed)
Remote pacemaker transmission.   

## 2022-04-09 ENCOUNTER — Other Ambulatory Visit: Payer: Self-pay | Admitting: Internal Medicine

## 2022-04-10 NOTE — Telephone Encounter (Signed)
Hi, could you schedule this patient a 12 month follow up visit? The patient was last seen by Dr. Rockey Situ on 04/10/21 and he wanted him to follow up with Dr. Caryl Comes in 12 months. Thank you so much.

## 2022-04-15 DIAGNOSIS — M810 Age-related osteoporosis without current pathological fracture: Secondary | ICD-10-CM | POA: Diagnosis not present

## 2022-05-01 ENCOUNTER — Other Ambulatory Visit: Payer: Self-pay

## 2022-05-01 NOTE — Telephone Encounter (Signed)
Hi,  Could you schedule this patient a 12 month follow up appointment? The patient last saw Dr. Rockey Situ on 04-10-2021. Thank you so much.

## 2022-05-02 MED ORDER — TICAGRELOR 60 MG PO TABS: 60.0000 mg | ORAL_TABLET | Freq: Two times a day (BID) | ORAL | 0 refills | Status: DC

## 2022-05-03 ENCOUNTER — Telehealth: Payer: Self-pay

## 2022-05-03 ENCOUNTER — Telehealth: Payer: Self-pay | Admitting: Internal Medicine

## 2022-05-03 ENCOUNTER — Other Ambulatory Visit: Payer: Self-pay

## 2022-05-03 MED ORDER — EMPAGLIFLOZIN 10 MG PO TABS: 10.0000 mg | ORAL_TABLET | Freq: Every day | ORAL | 0 refills | Status: DC

## 2022-05-03 NOTE — Telephone Encounter (Signed)
*  STAT* If patient is at the pharmacy, call can be transferred to refill team.   1. Which medications need to be refilled? (please list name of each medication and dose if known) ticagrelor (BRILINTA) 60 MG TABS tablet   2. Which pharmacy/location (including street and city if local pharmacy) is medication to be sent to? [place], [place]   3. Do they need a 30 day or 90 day supply? 90 day if possible   Pt is scheduled to see Dr. Caryl Comes on 4/2. Pt will run out of this medication 1 week before he will see Dr. Caryl Comes.

## 2022-05-03 NOTE — Telephone Encounter (Signed)
Patient requested printed prescription for Jardiance so he could send to [place] [place]. Rx has been printed and is waiting for Dr. Caryl Comes to sing. Patient will pick it up Tuesday afternoon.

## 2022-05-03 NOTE — Telephone Encounter (Signed)
Prescription was sent 05/02/2022 to [facility]. Pharmacy staff confirmed reception of prescription. Called and spoke to the patient's wife to inform her of information.

## 2022-05-07 ENCOUNTER — Other Ambulatory Visit: Payer: Self-pay | Admitting: Cardiovascular Disease

## 2022-05-07 NOTE — Telephone Encounter (Signed)
Unable to complete refill request. Pharmacy outside [place], placed in [place] box.

## 2022-05-07 NOTE — Telephone Encounter (Signed)
Attempted to call the patient to let him know his RX is ready for pickup. No answer- I left a message on his voice mail he may come pick this up at our front desk (ok per DPR).

## 2022-05-08 DIAGNOSIS — H52223 Regular astigmatism, bilateral: Secondary | ICD-10-CM | POA: Diagnosis not present

## 2022-05-08 DIAGNOSIS — H524 Presbyopia: Secondary | ICD-10-CM | POA: Diagnosis not present

## 2022-05-08 DIAGNOSIS — Z961 Presence of intraocular lens: Secondary | ICD-10-CM | POA: Diagnosis not present

## 2022-05-21 DIAGNOSIS — C9110 Chronic lymphocytic leukemia of B-cell type not having achieved remission: Secondary | ICD-10-CM | POA: Diagnosis not present

## 2022-05-21 DIAGNOSIS — K5732 Diverticulitis of large intestine without perforation or abscess without bleeding: Secondary | ICD-10-CM | POA: Diagnosis not present

## 2022-05-28 ENCOUNTER — Encounter: Payer: Self-pay | Admitting: Internal Medicine

## 2022-05-28 ENCOUNTER — Ambulatory Visit: Payer: PPO | Attending: Internal Medicine | Admitting: Internal Medicine

## 2022-05-28 ENCOUNTER — Encounter: Payer: Self-pay | Admitting: Oncology

## 2022-05-28 VITALS — BP 110/68 | HR 60 | Ht 69.0 in | Wt 175.4 lb

## 2022-05-28 DIAGNOSIS — I447 Left bundle-branch block, unspecified: Secondary | ICD-10-CM | POA: Diagnosis not present

## 2022-05-28 DIAGNOSIS — Z950 Presence of cardiac pacemaker: Secondary | ICD-10-CM | POA: Diagnosis not present

## 2022-05-28 DIAGNOSIS — R001 Bradycardia, unspecified: Secondary | ICD-10-CM | POA: Diagnosis not present

## 2022-05-28 DIAGNOSIS — I442 Atrioventricular block, complete: Secondary | ICD-10-CM

## 2022-05-28 LAB — PACEMAKER DEVICE OBSERVATION

## 2022-05-28 MED ORDER — EMPAGLIFLOZIN 10 MG PO TABS: 10.0000 mg | ORAL_TABLET | Freq: Every day | ORAL | 1 refills | Status: DC

## 2022-05-28 MED ORDER — ASPIRIN 81 MG PO TBEC: 81.0000 mg | DELAYED_RELEASE_TABLET | Freq: Every day | ORAL | 12 refills | Status: AC

## 2022-05-28 NOTE — Progress Notes (Signed)
Patient Care Team: Rusty Aus, MD as PCP - General (Internal Medicine) Deboraha Sprang, MD as PCP - Cardiology (Cardiology) Lloyd Huger, MD as Consulting Physician (Oncology)   HPI  Christopher Daniels is a 88 y.o. male seen in followup of sinus bradycardia; left ventricular dysfunction in the setting of a recent MI. Has LBBB;  he is status post Abbott CRT-P implantation (GT) 2019 when he presented with higher grade heart block.  History of ischemic heart disease prior stenting.  On Ticagrelor as single antiplatelet agent   12/17 STEMI    The patient denies chest pain, nocturnal dyspnea, orthopnea or peripheral edema.  There have been no palpitations, lightheadedness or syncope.  Complains of mild SOB w exertion.      DATE TEST EF   12/17 LHC 25-30% RCA  Stent 100% >> 0  LAD Stent 80% >>0  5/18 cMRI   36 % + LGE apical septum--normal LV thickness  5/18 Echo   30-35 % LVH 1.3 cm  9/18 LHC  Patent Stents  2/22 Echo  40-45% LAE  mild AI mild-mod  3/23 MYOVIEW 52% Fixed perfusion defect       Date Cr K LDL Hgb  4/18  0.79 4.5     6/18  1.0 4.5  13.7  1/19 0.9 3.8    10/19 1.04 4.3  10.9 (11.2-12/19)  12/20 1.1 4.6  12.3  1/22 1.2 4.3 47 12.3  12/23 1.08 4.3  14.2           Past Medical History:  Diagnosis Date   Arthritis    "joints; fingers, knees" (03/06/2017)   CAD (coronary artery disease)    a. 01/2016 Inf STEMI->PCI/Thrombectomy/DES to mid RCA (4.0 x 30 Resolute DES), EF 25-30%; b. 01/2016 Staged PCI/DES to mLAD (3.0x22 Resolute DES); c. 10/2016 Cath: LM nl, LAD patent stent, 30d, D2 70, LCX nl, OM1/2 nl, RCA patent mid/dist stent.   CLL (chronic lymphocytic leukemia)    "never taken any RX; just monitor this twice/year" (03/06/2017)   Diverticulitis    GERD (gastroesophageal reflux disease)    H/O Bell's palsy 1960s   HFrEF (heart failure with reduced ejection fraction)    High cholesterol    History of kidney stones    HOH (hard of hearing)     Iron deficiency anemia    Ischemic cardiomyopathy    a. 01/2016 EF 25-30%; b. 04/2016 f/u echo: EF 36%, mild glob HK, basal and mid inflat, inf, infsept, mid apical inf AK, mildly dil LA/RA, mild AI/MR, trace TR; c. 06/2016 Cardiac MRI: EF 36%, apical AK, mod diff HK, septal-lateral dyssynchrony   LBBB (left bundle branch block)    Mobitz type 2 second degree AV block 03/06/2017   Myocardial infarction 01/27/2016   Presence of permanent cardiac pacemaker    Skin cancer    a. on head years ago   Tachy-brady syndrome    a. 02/2017 s/p SJM 3262 Quadra Allur MP CRT-P.    Past Surgical History:  Procedure Laterality Date   BACK SURGERY     BI-VENTRICULAR PACEMAKER INSERTION (CRT-P)  03/06/2017   BIV PACEMAKER INSERTION CRT-P N/A 03/06/2017   Procedure: BIV PACEMAKER INSERTION CRT-P;  Surgeon: Evans Lance, MD;  Location: [place] [facility];  Service: Cardiovascular;  Laterality: N/A;   CARDIAC CATHETERIZATION N/A 07/29/2014   Procedure: Left Heart Cath;  Surgeon: Teodoro Spray, MD;  Location: [place] [facility];  Service: Cardiovascular;  Laterality: N/A;   CARDIAC CATHETERIZATION N/A 01/27/2016   Procedure: Left Heart Cath and Coronary Angiography;  Surgeon: [name], MD;  Location: [place] [facility];  Service: Cardiovascular;  Laterality: N/A;   CARDIAC CATHETERIZATION N/A 01/27/2016   Procedure: Coronary Stent Intervention;  Surgeon: [name], MD;  Location: [place] [facility];  Service: Cardiovascular;  Laterality: N/A;   CARDIAC CATHETERIZATION N/A 02/13/2016   Procedure: Coronary Stent Intervention;  Surgeon: [name], MD;  Location: [place] [facility];  Service: Cardiovascular;  Laterality: N/A;   CARDIAC CATHETERIZATION N/A 02/13/2016   Procedure: Left Heart Cath and Coronary Angiography;  Surgeon: [name], MD;  Location: [place] [facility];  Service: Cardiovascular;  Laterality: N/A;   CARDIAC CATHETERIZATION     CATARACT EXTRACTION W/PHACO Right 07/18/2014   Procedure: CATARACT  EXTRACTION PHACO AND INTRAOCULAR LENS PLACEMENT (IOC);  Surgeon: Estill Cotta, MD;  Location: [facility];  Service: Ophthalmology;  Laterality: Right;  [place] 03:11 AP% 28.4 CDE 75.64   CATARACT EXTRACTION W/PHACO Left 06/19/2015   Procedure: CATARACT EXTRACTION PHACO AND INTRAOCULAR LENS PLACEMENT (IOC);  Surgeon: Estill Cotta, MD;  Location: [facility];  Service: Ophthalmology;  Laterality: Left;  [place] 02:36.5 AP% 27.7 CDE 69.51 fluid pack lot # [id number]   COLONOSCOPY WITH PROPOFOL N/A 04/20/2018   Procedure: COLONOSCOPY WITH PROPOFOL;  Surgeon: Manya Silvas, MD;  Location: [facility];  Service: Endoscopy;  Laterality: N/A;   ESOPHAGOGASTRODUODENOSCOPY (EGD) WITH PROPOFOL N/A 04/20/2018   Procedure: ESOPHAGOGASTRODUODENOSCOPY (EGD) WITH PROPOFOL;  Surgeon: Manya Silvas, MD;  Location: [facility];  Service: Endoscopy;  Laterality: N/A;   EYE SURGERY     INGUINAL HERNIA REPAIR Left 2010   INSERT / REPLACE / REMOVE PACEMAKER     LEFT HEART CATH AND CORONARY ANGIOGRAPHY N/A 11/18/2016   Procedure: LEFT HEART CATH AND CORONARY ANGIOGRAPHY;  Surgeon: Wellington Hampshire, MD;  Location: [place] [facility];  Service: Cardiovascular;  Laterality: N/A;   [place]   "ruptured disc; took a piece off my buttocks and fixed it"   SKIN CANCER EXCISION     "top of my head"   TONSILLECTOMY      Current Outpatient Medications  Medication Sig Dispense Refill   atorvastatin (LIPITOR) 80 MG tablet Take 1 tablet (80 mg total) by mouth every evening. 90 tablet 3   Calcium Carbonate-Vit D-Min (CALTRATE PLUS PO) Take by mouth 2 (two) times daily. Taking tablets  2 daily     carvedilol (COREG) 3.125 MG tablet TAKE 1 TABLET BY MOUTH TWICE DAILY 180 tablet 1   co-enzyme Q-10 30 MG capsule Take 30 mg by mouth daily.     Cyanocobalamin (B-12) 500 MCG SUBL Place 500 mcg under the tongue once a week.      empagliflozin (JARDIANCE) 10 MG TABS tablet Take 1 tablet (10 mg total) by mouth  daily. 30 tablet 0   ferrous gluconate (FERGON) 324 MG tablet Takes 1 tablet PO twice per week     lisinopril (PRINIVIL,ZESTRIL) 5 MG tablet Take 2.5 mg by mouth daily.     nitroGLYCERIN (NITROSTAT) 0.4 MG SL tablet Place 1 tablet (0.4 mg total) under the tongue every 5 (five) minutes x 3 doses as needed for chest pain. 25 tablet 3   pantoprazole (PROTONIX) 40 MG tablet Take 40 mg by mouth daily. Taking 1 tablet daily     spironolactone (ALDACTONE) 25 MG tablet TAKE ONE-HALF TABLET ONCE A DAY 45 tablet 3  ticagrelor (BRILINTA) 60 MG TABS tablet Take 1 tablet (60 mg total) by mouth 2 (two) times daily. 180 tablet 0   No current facility-administered medications for this visit.    Allergies  Allergen Reactions   Niaspan [Niacin Er] Anaphylaxis and Other (See Comments)    Episode happened 10 to 15 years ago.  Has not tried any other similar meds since   Adhesive [Tape] Other (See Comments)    Skin is very sensitive; PLEASE USE PAPER TAPE!!      ROS:  Please see the history of present illness.     All other systems reviewed and negative.    BP 110/68   Pulse 60   Ht 5\' 9"  (1.753 m)   Wt 175 lb 6.4 oz (79.6 kg)   SpO2 95%   BMI 25.90 kg/m  Well developed and well nourished in no acute distress HENT normal Neck supple with JVP-flat Clear Device pocket well healed; without hematoma or erythema.  There is no tethering  Regular rate and rhythm, no  murmur Abd-soft with active BS No Clubbing cyanosis  edema Skin-warm and dry A & Oriented  Grossly normal sensory and motor function  ECG sinus with repeat pacing with patient at 60 with a isoelectric QRS in lead I and an isoelectric QRS in lead V1  Device function is normal. Programming changes  none See Paceart for details     Assessment and Plan:  Sinus bradycardia    High-grade heart block intermittent  Ischemic cardiomyopathy  status post DES 2   12/17  EF 36% MRI 5/18   Congestive heart failure class II  Left bundle  branch block  CRT-pacemaker St Jude    Hyperlipidemia  Orthostatic hypotension  Low voltage    Euvolemic.  Continue his Aldactone 12.5.  Improve cardiomyopathy, continue spironolactone 12.5 as well as carvedilol 3.125 and lisinopril 2.5.  Have reached out to Dr. Gaylyn Cheers who did his stent.  We will discontinue his Brilinta and begin him on aspirin 81 mg.  Continue Lipitor.  Device function is normal.

## 2022-05-28 NOTE — Patient Instructions (Addendum)
Medication Instructions:  Your physician has recommended you make the following change in your medication:   ** Stop Brillinta  ** Begin Aspirin 81mg  - 1 tablet by mouth daily   *If you need a refill on your cardiac medications before your next appointment, please call your pharmacy*   Lab Work: None ordered.  If you have labs (blood work) drawn today and your tests are completely normal, you will receive your results only by: [place] (if you have MyChart) OR A paper copy in the mail If you have any lab test that is abnormal or we need to change your treatment, we will call you to review the results.   Testing/Procedures: None ordered.    Follow-Up: At [facility], you and your health needs are our priority.  As part of our continuing mission to provide you with exceptional heart care, we have created designated Provider Care Teams.  These Care Teams include your primary Cardiologist (physician) and Advanced Practice Providers (APPs -  Physician Assistants and Nurse Practitioners) who all work together to provide you with the care you need, when you need it.  We recommend signing up for the patient portal called "MyChart".  Sign up information is provided on this After Visit Summary.  MyChart is used to connect with patients for Virtual Visits (Telemedicine).  Patients are able to view lab/test results, encounter notes, upcoming appointments, etc.  Non-urgent messages can be sent to your provider as well.   To learn more about what you can do with MyChart, go to [email or web address].    Your next appointment:   12 months with Dr Caryl Comes

## 2022-05-29 ENCOUNTER — Encounter: Payer: Self-pay | Admitting: Internal Medicine

## 2022-06-04 LAB — CUP PACEART INCLINIC DEVICE CHECK
Date Time Interrogation Session: 20240402161727
Implantable Lead Connection Status: 753985
Implantable Lead Connection Status: 753985
Implantable Lead Connection Status: 753985
Implantable Lead Implant Date: 20190110
Implantable Lead Implant Date: 20190110
Implantable Lead Implant Date: 20190110
Implantable Lead Location: 753858
Implantable Lead Location: 753859
Implantable Lead Location: 753860
Implantable Pulse Generator Implant Date: 20190110
Pulse Gen Model: 3262
Pulse Gen Serial Number: 8942222

## 2022-06-05 ENCOUNTER — Ambulatory Visit (INDEPENDENT_AMBULATORY_CARE_PROVIDER_SITE_OTHER): Payer: PPO

## 2022-06-05 DIAGNOSIS — I442 Atrioventricular block, complete: Secondary | ICD-10-CM

## 2022-06-05 LAB — CUP PACEART REMOTE DEVICE CHECK
Battery Remaining Longevity: 6 mo
Battery Remaining Percentage: 9 %
Battery Voltage: 2.83 V
Brady Statistic AP VP Percent: 90 %
Brady Statistic AP VS Percent: 1 %
Brady Statistic AS VP Percent: 8.9 %
Brady Statistic AS VS Percent: 1 %
Brady Statistic RA Percent Paced: 90 %
Date Time Interrogation Session: 20240410020015
Implantable Lead Connection Status: 753985
Implantable Lead Connection Status: 753985
Implantable Lead Connection Status: 753985
Implantable Lead Implant Date: 20190110
Implantable Lead Implant Date: 20190110
Implantable Lead Implant Date: 20190110
Implantable Lead Location: 753859
Implantable Lead Location: 753860
Implantable Lead Location: 753858
Implantable Pulse Generator Implant Date: 20190110
Lead Channel Impedance Value: 400 Ohm
Lead Channel Impedance Value: 460 Ohm
Lead Channel Impedance Value: 610 Ohm
Lead Channel Pacing Threshold Amplitude: 0.5 V
Lead Channel Pacing Threshold Amplitude: 0.75 V
Lead Channel Pacing Threshold Amplitude: 1.75 V
Lead Channel Pacing Threshold Pulse Width: 0.5 ms
Lead Channel Pacing Threshold Pulse Width: 0.5 ms
Lead Channel Pacing Threshold Pulse Width: 1.5 ms
Lead Channel Sensing Intrinsic Amplitude: 10.3 mV
Lead Channel Sensing Intrinsic Amplitude: 2.5 mV
Lead Channel Setting Pacing Amplitude: 2 V
Lead Channel Setting Pacing Amplitude: 2.25 V
Lead Channel Setting Pacing Amplitude: 2.5 V
Lead Channel Setting Pacing Pulse Width: 0.5 ms
Lead Channel Setting Pacing Pulse Width: 1.5 ms
Lead Channel Setting Sensing Sensitivity: 2 mV
Pulse Gen Model: 3262
Pulse Gen Serial Number: 8942222

## 2022-06-06 ENCOUNTER — Telehealth: Payer: Self-pay

## 2022-06-06 NOTE — Telephone Encounter (Signed)
Outreach made to Pt.  Spoke with Pt's wife per DPR.  Advised battery life <6 months and would schedule monthly battery checks.  Wife appreciative of call.

## 2022-06-06 NOTE — Telephone Encounter (Signed)
Alert received from [employer]:  Scheduled remote reviewed. Normal device function.   The device estimates 5.9 months until ERI, will start IFU.  Sent to triage.  Need monthly remote checks.

## 2022-07-05 ENCOUNTER — Ambulatory Visit (INDEPENDENT_AMBULATORY_CARE_PROVIDER_SITE_OTHER): Payer: PPO

## 2022-07-05 DIAGNOSIS — I442 Atrioventricular block, complete: Secondary | ICD-10-CM

## 2022-07-05 LAB — CUP PACEART REMOTE DEVICE CHECK
Battery Remaining Longevity: 5 mo
Battery Remaining Percentage: 8 %
Battery Voltage: 2.81 V
Brady Statistic AP VP Percent: 90 %
Brady Statistic AP VS Percent: 1 %
Brady Statistic AS VP Percent: 9.3 %
Brady Statistic AS VS Percent: 1 %
Brady Statistic RA Percent Paced: 90 %
Date Time Interrogation Session: 20240510020018
Implantable Lead Connection Status: 753985
Implantable Lead Connection Status: 753985
Implantable Lead Connection Status: 753985
Implantable Lead Implant Date: 20190110
Implantable Lead Implant Date: 20190110
Implantable Lead Implant Date: 20190110
Implantable Lead Location: 753858
Implantable Lead Location: 753859
Implantable Lead Location: 753860
Implantable Pulse Generator Implant Date: 20190110
Lead Channel Impedance Value: 410 Ohm
Lead Channel Impedance Value: 450 Ohm
Lead Channel Impedance Value: 640 Ohm
Lead Channel Pacing Threshold Amplitude: 0.5 V
Lead Channel Pacing Threshold Amplitude: 0.75 V
Lead Channel Pacing Threshold Amplitude: 1.75 V
Lead Channel Pacing Threshold Pulse Width: 0.5 ms
Lead Channel Pacing Threshold Pulse Width: 0.5 ms
Lead Channel Pacing Threshold Pulse Width: 1.5 ms
Lead Channel Sensing Intrinsic Amplitude: 12 mV
Lead Channel Sensing Intrinsic Amplitude: 4.8 mV
Lead Channel Setting Pacing Amplitude: 2 V
Lead Channel Setting Pacing Amplitude: 2.25 V
Lead Channel Setting Pacing Amplitude: 2.5 V
Lead Channel Setting Pacing Pulse Width: 0.5 ms
Lead Channel Setting Pacing Pulse Width: 1.5 ms
Lead Channel Setting Sensing Sensitivity: 2 mV
Pulse Gen Model: 3262
Pulse Gen Serial Number: 8942222

## 2022-07-08 ENCOUNTER — Telehealth: Payer: Self-pay | Admitting: Internal Medicine

## 2022-07-08 MED ORDER — EMPAGLIFLOZIN 10 MG PO TABS: 10.0000 mg | ORAL_TABLET | Freq: Every day | ORAL | 1 refills | Status: DC

## 2022-07-08 NOTE — Telephone Encounter (Signed)
Pt requesting Jardiance 10mg  - be sent to [facility] as he can now fill with his current insurance #100 tablets - 1 tablet by mouth daily sent to [facility] as requested.  Pt thanked Charity fundraiser for the assistance.

## 2022-07-08 NOTE — Telephone Encounter (Signed)
Patient is calling requesting a callback from [facility]. He states he has a question for her. Please advise.

## 2022-07-08 NOTE — Addendum Note (Signed)
Addended by: Alois Cliche on: 07/08/2022 04:26 PM   Modules accepted: Orders

## 2022-07-12 NOTE — Progress Notes (Signed)
Remote pacemaker transmission.   

## 2022-07-23 NOTE — Progress Notes (Signed)
Remote pacemaker transmission.   

## 2022-08-05 ENCOUNTER — Ambulatory Visit (INDEPENDENT_AMBULATORY_CARE_PROVIDER_SITE_OTHER): Payer: PPO

## 2022-08-05 DIAGNOSIS — I442 Atrioventricular block, complete: Secondary | ICD-10-CM

## 2022-08-06 LAB — CUP PACEART REMOTE DEVICE CHECK
Battery Remaining Longevity: 4 mo
Battery Remaining Percentage: 6 %
Battery Voltage: 2.8 V
Brady Statistic AP VP Percent: 89 %
Brady Statistic AP VS Percent: 1 %
Brady Statistic AS VP Percent: 9.6 %
Brady Statistic AS VS Percent: 1 %
Brady Statistic RA Percent Paced: 89 %
Date Time Interrogation Session: 20240610020020
Implantable Lead Connection Status: 753985
Implantable Lead Connection Status: 753985
Implantable Lead Connection Status: 753985
Implantable Lead Implant Date: 20190110
Implantable Lead Implant Date: 20190110
Implantable Lead Implant Date: 20190110
Implantable Lead Location: 753858
Implantable Lead Location: 753859
Implantable Lead Location: 753860
Implantable Pulse Generator Implant Date: 20190110
Lead Channel Impedance Value: 380 Ohm
Lead Channel Impedance Value: 410 Ohm
Lead Channel Impedance Value: 600 Ohm
Lead Channel Pacing Threshold Amplitude: 0.5 V
Lead Channel Pacing Threshold Amplitude: 0.75 V
Lead Channel Pacing Threshold Amplitude: 1.75 V
Lead Channel Pacing Threshold Pulse Width: 0.5 ms
Lead Channel Pacing Threshold Pulse Width: 0.5 ms
Lead Channel Pacing Threshold Pulse Width: 1.5 ms
Lead Channel Sensing Intrinsic Amplitude: 12 mV
Lead Channel Sensing Intrinsic Amplitude: 4.1 mV
Lead Channel Setting Pacing Amplitude: 2 V
Lead Channel Setting Pacing Amplitude: 2.25 V
Lead Channel Setting Pacing Amplitude: 2.5 V
Lead Channel Setting Pacing Pulse Width: 0.5 ms
Lead Channel Setting Pacing Pulse Width: 1.5 ms
Lead Channel Setting Sensing Sensitivity: 2 mV
Pulse Gen Model: 3262
Pulse Gen Serial Number: 8942222

## 2022-08-28 NOTE — Addendum Note (Signed)
Addended by: Geralyn Flash D on: 08/28/2022 12:33 PM   Modules accepted: Level of Service

## 2022-08-28 NOTE — Progress Notes (Signed)
Remote pacemaker transmission.   

## 2022-09-04 ENCOUNTER — Ambulatory Visit (INDEPENDENT_AMBULATORY_CARE_PROVIDER_SITE_OTHER): Payer: PPO

## 2022-09-04 DIAGNOSIS — I442 Atrioventricular block, complete: Secondary | ICD-10-CM | POA: Diagnosis not present

## 2022-09-05 LAB — CUP PACEART REMOTE DEVICE CHECK
Battery Remaining Longevity: 4 mo
Battery Remaining Percentage: 6 %
Battery Voltage: 2.8 V
Brady Statistic AP VP Percent: 89 %
Brady Statistic AP VS Percent: 1 %
Brady Statistic AS VP Percent: 9.8 %
Brady Statistic AS VS Percent: 1 %
Brady Statistic RA Percent Paced: 89 %
Date Time Interrogation Session: 20240711020021
Implantable Lead Connection Status: 753985
Implantable Lead Connection Status: 753985
Implantable Lead Connection Status: 753985
Implantable Lead Implant Date: 20190110
Implantable Lead Implant Date: 20190110
Implantable Lead Implant Date: 20190110
Implantable Lead Location: 753858
Implantable Lead Location: 753859
Implantable Lead Location: 753860
Implantable Pulse Generator Implant Date: 20190110
Lead Channel Impedance Value: 400 Ohm
Lead Channel Impedance Value: 440 Ohm
Lead Channel Impedance Value: 630 Ohm
Lead Channel Pacing Threshold Amplitude: 0.5 V
Lead Channel Pacing Threshold Amplitude: 0.75 V
Lead Channel Pacing Threshold Amplitude: 1.75 V
Lead Channel Pacing Threshold Pulse Width: 0.5 ms
Lead Channel Pacing Threshold Pulse Width: 0.5 ms
Lead Channel Pacing Threshold Pulse Width: 1.5 ms
Lead Channel Sensing Intrinsic Amplitude: 12 mV
Lead Channel Sensing Intrinsic Amplitude: 2.2 mV
Lead Channel Setting Pacing Amplitude: 2 V
Lead Channel Setting Pacing Amplitude: 2.25 V
Lead Channel Setting Pacing Amplitude: 2.5 V
Lead Channel Setting Pacing Pulse Width: 0.5 ms
Lead Channel Setting Pacing Pulse Width: 1.5 ms
Lead Channel Setting Sensing Sensitivity: 2 mV
Pulse Gen Model: 3262
Pulse Gen Serial Number: 8942222

## 2022-09-06 DIAGNOSIS — E538 Deficiency of other specified B group vitamins: Secondary | ICD-10-CM | POA: Diagnosis not present

## 2022-09-06 DIAGNOSIS — E782 Mixed hyperlipidemia: Secondary | ICD-10-CM | POA: Diagnosis not present

## 2022-09-16 DIAGNOSIS — R739 Hyperglycemia, unspecified: Secondary | ICD-10-CM | POA: Diagnosis not present

## 2022-09-16 DIAGNOSIS — I420 Dilated cardiomyopathy: Secondary | ICD-10-CM | POA: Diagnosis not present

## 2022-09-16 DIAGNOSIS — C9110 Chronic lymphocytic leukemia of B-cell type not having achieved remission: Secondary | ICD-10-CM | POA: Diagnosis not present

## 2022-09-16 DIAGNOSIS — M818 Other osteoporosis without current pathological fracture: Secondary | ICD-10-CM | POA: Diagnosis not present

## 2022-09-16 DIAGNOSIS — Z125 Encounter for screening for malignant neoplasm of prostate: Secondary | ICD-10-CM | POA: Diagnosis not present

## 2022-09-16 DIAGNOSIS — E538 Deficiency of other specified B group vitamins: Secondary | ICD-10-CM | POA: Diagnosis not present

## 2022-09-16 DIAGNOSIS — E782 Mixed hyperlipidemia: Secondary | ICD-10-CM | POA: Diagnosis not present

## 2022-09-26 NOTE — Progress Notes (Signed)
Remote pacemaker transmission.   

## 2022-10-07 ENCOUNTER — Ambulatory Visit (INDEPENDENT_AMBULATORY_CARE_PROVIDER_SITE_OTHER): Payer: PPO

## 2022-10-07 DIAGNOSIS — I442 Atrioventricular block, complete: Secondary | ICD-10-CM

## 2022-10-14 ENCOUNTER — Other Ambulatory Visit: Payer: Self-pay | Admitting: Internal Medicine

## 2022-10-16 NOTE — Telephone Encounter (Signed)
This is a [facility] pt 

## 2022-10-22 ENCOUNTER — Encounter: Payer: Self-pay | Admitting: Internal Medicine

## 2022-10-22 NOTE — Progress Notes (Signed)
Remote pacemaker transmission.   

## 2022-11-08 ENCOUNTER — Ambulatory Visit (INDEPENDENT_AMBULATORY_CARE_PROVIDER_SITE_OTHER): Payer: PPO

## 2022-11-08 DIAGNOSIS — I420 Dilated cardiomyopathy: Secondary | ICD-10-CM

## 2022-11-11 LAB — CUP PACEART REMOTE DEVICE CHECK
Battery Remaining Longevity: 3 mo
Battery Remaining Percentage: 5 %
Battery Voltage: 2.77 V
Brady Statistic AP VP Percent: 88 %
Brady Statistic AP VS Percent: 1 %
Brady Statistic AS VP Percent: 10 %
Brady Statistic AS VS Percent: 1 %
Brady Statistic RA Percent Paced: 87 %
Date Time Interrogation Session: 20240913193938
Implantable Lead Connection Status: 753985
Implantable Lead Connection Status: 753985
Implantable Lead Connection Status: 753985
Implantable Lead Implant Date: 20190110
Implantable Lead Implant Date: 20190110
Implantable Lead Implant Date: 20190110
Implantable Lead Location: 753858
Implantable Lead Location: 753859
Implantable Lead Location: 753860
Implantable Pulse Generator Implant Date: 20190110
Lead Channel Impedance Value: 390 Ohm
Lead Channel Impedance Value: 400 Ohm
Lead Channel Impedance Value: 600 Ohm
Lead Channel Pacing Threshold Amplitude: 0.5 V
Lead Channel Pacing Threshold Amplitude: 0.75 V
Lead Channel Pacing Threshold Amplitude: 1.75 V
Lead Channel Pacing Threshold Pulse Width: 0.5 ms
Lead Channel Pacing Threshold Pulse Width: 0.5 ms
Lead Channel Pacing Threshold Pulse Width: 1.5 ms
Lead Channel Sensing Intrinsic Amplitude: 12 mV
Lead Channel Sensing Intrinsic Amplitude: 3 mV
Lead Channel Setting Pacing Amplitude: 2 V
Lead Channel Setting Pacing Amplitude: 2.25 V
Lead Channel Setting Pacing Amplitude: 2.5 V
Lead Channel Setting Pacing Pulse Width: 0.5 ms
Lead Channel Setting Pacing Pulse Width: 1.5 ms
Lead Channel Setting Sensing Sensitivity: 2 mV
Pulse Gen Model: 3262
Pulse Gen Serial Number: 8942222

## 2022-11-15 NOTE — Addendum Note (Signed)
Addended by: Elease Etienne A on: 11/15/2022 12:11 PM   Modules accepted: Level of Service

## 2022-11-15 NOTE — Progress Notes (Signed)
Remote pacemaker transmission.   

## 2022-12-09 ENCOUNTER — Ambulatory Visit (INDEPENDENT_AMBULATORY_CARE_PROVIDER_SITE_OTHER): Payer: PPO

## 2022-12-09 DIAGNOSIS — I420 Dilated cardiomyopathy: Secondary | ICD-10-CM | POA: Diagnosis not present

## 2022-12-09 DIAGNOSIS — I442 Atrioventricular block, complete: Secondary | ICD-10-CM

## 2022-12-11 LAB — CUP PACEART REMOTE DEVICE CHECK
Battery Remaining Longevity: 3 mo
Battery Remaining Percentage: 4 %
Battery Voltage: 2.75 V
Brady Statistic AP VP Percent: 88 %
Brady Statistic AP VS Percent: 1 %
Brady Statistic AS VP Percent: 10 %
Brady Statistic AS VS Percent: 1 %
Brady Statistic RA Percent Paced: 87 %
Date Time Interrogation Session: 20241012040016
Implantable Lead Connection Status: 753985
Implantable Lead Connection Status: 753985
Implantable Lead Connection Status: 753985
Implantable Lead Implant Date: 20190110
Implantable Lead Implant Date: 20190110
Implantable Lead Implant Date: 20190110
Implantable Lead Location: 753858
Implantable Lead Location: 753859
Implantable Lead Location: 753860
Implantable Pulse Generator Implant Date: 20190110
Lead Channel Impedance Value: 410 Ohm
Lead Channel Impedance Value: 430 Ohm
Lead Channel Impedance Value: 660 Ohm
Lead Channel Pacing Threshold Amplitude: 0.5 V
Lead Channel Pacing Threshold Amplitude: 0.75 V
Lead Channel Pacing Threshold Amplitude: 1.75 V
Lead Channel Pacing Threshold Pulse Width: 0.5 ms
Lead Channel Pacing Threshold Pulse Width: 0.5 ms
Lead Channel Pacing Threshold Pulse Width: 1.5 ms
Lead Channel Sensing Intrinsic Amplitude: 10 mV
Lead Channel Sensing Intrinsic Amplitude: 2.6 mV
Lead Channel Setting Pacing Amplitude: 2 V
Lead Channel Setting Pacing Amplitude: 2.25 V
Lead Channel Setting Pacing Amplitude: 2.5 V
Lead Channel Setting Pacing Pulse Width: 0.5 ms
Lead Channel Setting Pacing Pulse Width: 1.5 ms
Lead Channel Setting Sensing Sensitivity: 2 mV
Pulse Gen Model: 3262
Pulse Gen Serial Number: 8942222

## 2022-12-17 DIAGNOSIS — Z23 Encounter for immunization: Secondary | ICD-10-CM | POA: Diagnosis not present

## 2022-12-25 NOTE — Progress Notes (Signed)
Remote pacemaker transmission.   

## 2023-01-02 DIAGNOSIS — D2262 Melanocytic nevi of left upper limb, including shoulder: Secondary | ICD-10-CM | POA: Diagnosis not present

## 2023-01-02 DIAGNOSIS — Z08 Encounter for follow-up examination after completed treatment for malignant neoplasm: Secondary | ICD-10-CM | POA: Diagnosis not present

## 2023-01-02 DIAGNOSIS — Z85828 Personal history of other malignant neoplasm of skin: Secondary | ICD-10-CM | POA: Diagnosis not present

## 2023-01-02 DIAGNOSIS — D2261 Melanocytic nevi of right upper limb, including shoulder: Secondary | ICD-10-CM | POA: Diagnosis not present

## 2023-01-02 DIAGNOSIS — D225 Melanocytic nevi of trunk: Secondary | ICD-10-CM | POA: Diagnosis not present

## 2023-01-02 DIAGNOSIS — L570 Actinic keratosis: Secondary | ICD-10-CM | POA: Diagnosis not present

## 2023-01-02 DIAGNOSIS — D2272 Melanocytic nevi of left lower limb, including hip: Secondary | ICD-10-CM | POA: Diagnosis not present

## 2023-01-02 DIAGNOSIS — L821 Other seborrheic keratosis: Secondary | ICD-10-CM | POA: Diagnosis not present

## 2023-01-02 DIAGNOSIS — D2271 Melanocytic nevi of right lower limb, including hip: Secondary | ICD-10-CM | POA: Diagnosis not present

## 2023-01-09 ENCOUNTER — Ambulatory Visit (INDEPENDENT_AMBULATORY_CARE_PROVIDER_SITE_OTHER): Payer: PPO

## 2023-01-09 DIAGNOSIS — I442 Atrioventricular block, complete: Secondary | ICD-10-CM

## 2023-01-10 LAB — CUP PACEART REMOTE DEVICE CHECK
Battery Remaining Longevity: 1 mo
Battery Remaining Percentage: 3 %
Battery Voltage: 2.71 V
Brady Statistic AP VP Percent: 88 %
Brady Statistic AP VS Percent: 1 %
Brady Statistic AS VP Percent: 10 %
Brady Statistic AS VS Percent: 1 %
Brady Statistic RA Percent Paced: 87 %
Date Time Interrogation Session: 20241114020012
Implantable Lead Connection Status: 753985
Implantable Lead Connection Status: 753985
Implantable Lead Connection Status: 753985
Implantable Lead Implant Date: 20190110
Implantable Lead Implant Date: 20190110
Implantable Lead Implant Date: 20190110
Implantable Lead Location: 753858
Implantable Lead Location: 753859
Implantable Lead Location: 753860
Implantable Pulse Generator Implant Date: 20190110
Lead Channel Impedance Value: 410 Ohm
Lead Channel Impedance Value: 410 Ohm
Lead Channel Impedance Value: 690 Ohm
Lead Channel Pacing Threshold Amplitude: 0.5 V
Lead Channel Pacing Threshold Amplitude: 0.75 V
Lead Channel Pacing Threshold Amplitude: 1.75 V
Lead Channel Pacing Threshold Pulse Width: 0.5 ms
Lead Channel Pacing Threshold Pulse Width: 0.5 ms
Lead Channel Pacing Threshold Pulse Width: 1.5 ms
Lead Channel Sensing Intrinsic Amplitude: 12 mV
Lead Channel Sensing Intrinsic Amplitude: 4.3 mV
Lead Channel Setting Pacing Amplitude: 2 V
Lead Channel Setting Pacing Amplitude: 2.25 V
Lead Channel Setting Pacing Amplitude: 2.5 V
Lead Channel Setting Pacing Pulse Width: 0.5 ms
Lead Channel Setting Pacing Pulse Width: 1.5 ms
Lead Channel Setting Sensing Sensitivity: 2 mV
Pulse Gen Model: 3262
Pulse Gen Serial Number: 8942222

## 2023-01-20 ENCOUNTER — Other Ambulatory Visit: Payer: Self-pay | Admitting: Internal Medicine

## 2023-01-27 ENCOUNTER — Encounter: Payer: Self-pay | Admitting: Internal Medicine

## 2023-01-28 NOTE — Addendum Note (Signed)
Addended by: Elease Etienne A on: 01/28/2023 10:15 AM   Modules accepted: Level of Service

## 2023-01-28 NOTE — Progress Notes (Signed)
Remote pacemaker transmission.   

## 2023-02-06 DIAGNOSIS — M810 Age-related osteoporosis without current pathological fracture: Secondary | ICD-10-CM | POA: Diagnosis not present

## 2023-02-10 ENCOUNTER — Other Ambulatory Visit: Payer: Self-pay

## 2023-02-10 ENCOUNTER — Ambulatory Visit: Payer: PPO

## 2023-02-10 ENCOUNTER — Inpatient Hospital Stay: Payer: PPO | Attending: Oncology

## 2023-02-10 DIAGNOSIS — D509 Iron deficiency anemia, unspecified: Secondary | ICD-10-CM

## 2023-02-10 DIAGNOSIS — C9110 Chronic lymphocytic leukemia of B-cell type not having achieved remission: Secondary | ICD-10-CM | POA: Diagnosis not present

## 2023-02-10 DIAGNOSIS — I442 Atrioventricular block, complete: Secondary | ICD-10-CM

## 2023-02-10 DIAGNOSIS — Z79899 Other long term (current) drug therapy: Secondary | ICD-10-CM | POA: Diagnosis not present

## 2023-02-10 LAB — CBC WITH DIFFERENTIAL (CANCER CENTER ONLY)
Abs Immature Granulocytes: 0.02 10*3/uL (ref 0.00–0.07)
Basophils Absolute: 0.1 10*3/uL (ref 0.0–0.1)
Basophils Relative: 1 %
Eosinophils Absolute: 0.1 10*3/uL (ref 0.0–0.5)
Eosinophils Relative: 1 %
HCT: 39.7 % (ref 39.0–52.0)
Hemoglobin: 13.2 g/dL (ref 13.0–17.0)
Immature Granulocytes: 0 %
Lymphocytes Relative: 64 %
Lymphs Abs: 6 10*3/uL — ABNORMAL HIGH (ref 0.7–4.0)
MCH: 32.8 pg (ref 26.0–34.0)
MCHC: 33.2 g/dL (ref 30.0–36.0)
MCV: 98.5 fL (ref 80.0–100.0)
Monocytes Absolute: 0.6 10*3/uL (ref 0.1–1.0)
Monocytes Relative: 7 %
Neutro Abs: 2.5 10*3/uL (ref 1.7–7.7)
Neutrophils Relative %: 27 %
Platelet Count: 207 10*3/uL (ref 150–400)
RBC: 4.03 MIL/uL — ABNORMAL LOW (ref 4.22–5.81)
RDW: 13.4 % (ref 11.5–15.5)
WBC Count: 9.2 10*3/uL (ref 4.0–10.5)
nRBC: 0 % (ref 0.0–0.2)

## 2023-02-10 LAB — IRON AND TIBC
Iron: 111 ug/dL (ref 45–182)
Saturation Ratios: 45 % — ABNORMAL HIGH (ref 17.9–39.5)
TIBC: 249 ug/dL — ABNORMAL LOW (ref 250–450)
UIBC: 138 ug/dL

## 2023-02-10 LAB — FERRITIN: Ferritin: 48 ng/mL (ref 24–336)

## 2023-02-11 ENCOUNTER — Inpatient Hospital Stay: Payer: PPO | Admitting: Oncology

## 2023-02-11 DIAGNOSIS — C9110 Chronic lymphocytic leukemia of B-cell type not having achieved remission: Secondary | ICD-10-CM

## 2023-02-11 NOTE — Progress Notes (Unsigned)
[facility]  [phone] [phone]  ID: Hal Neer OB: [birth year]  MR#: [id number]  SAY#:[id number]  Patient Care Team: Danella Penton, MD as PCP - General (Internal Medicine) Duke Salvia, MD as PCP - Cardiology (Cardiology) Jeralyn Ruths, MD as Consulting Physician (Oncology)  I connected with Hal Neer on 02/11/23 at  3:30 PM EST by {Blank single:19197::"video enabled telemedicine visit","telephone visit"} and verified that I am speaking with the correct person using two identifiers.   I discussed the limitations, risks, security and privacy concerns of performing an evaluation and management service by telemedicine and the availability of in-person appointments. I also discussed with the patient that there may be a patient responsible charge related to this service. The patient expressed understanding and agreed to proceed.   Other persons participating in the visit and their role in the encounter: Patient, MD.  Patient's location: Home. Provider's location: Clinic.  CHIEF COMPLAINT: CLL, iron deficiency anemia.  INTERVAL HISTORY: Patient agreed to video assisted telemedicine visit for discussion of his laboratory work and routine yearly evaluation.  He continues to feel well and remains asymptomatic.  He does not complain of any weakness or fatigue.  He has no neurologic complaints.  He has no fevers or night sweats.  He has a good appetite and denies weight loss. He denies any chest pain, cough, hemoptysis, or shortness of breath.  He denies any nausea, vomiting, constipation, or diarrhea.  He has no melena or hematochezia.  He has no urinary complaints.  Patient offers no specific complaints today.  REVIEW OF SYSTEMS:   Review of Systems  Constitutional: Negative.  Negative for fever, malaise/fatigue and weight loss.  Respiratory: Negative.  Negative for cough and shortness of breath.   Cardiovascular: Negative.  Negative  for chest pain and leg swelling.  Gastrointestinal: Negative.  Negative for abdominal pain, blood in stool, constipation and melena.  Genitourinary: Negative.  Negative for dysuria and hematuria.  Musculoskeletal: Negative.  Negative for back pain.  Skin: Negative.  Negative for rash.  Neurological: Negative.  Negative for sensory change, focal weakness and weakness.  Endo/Heme/Allergies:  Does not bruise/bleed easily.  Psychiatric/Behavioral: Negative.  The patient is not nervous/anxious.     As per HPI. Otherwise, a complete review of systems is negative.  PAST MEDICAL HISTORY: Past Medical History:  Diagnosis Date  . Arthritis    "joints; fingers, knees" (03/06/2017)  . CAD (coronary artery disease)    a. 01/2016 Inf STEMI->PCI/Thrombectomy/DES to mid RCA (4.0 x 30 Resolute DES), EF 25-30%; b. 01/2016 Staged PCI/DES to mLAD (3.0x22 Resolute DES); c. 10/2016 Cath: LM nl, LAD patent stent, 30d, D2 70, LCX nl, OM1/2 nl, RCA patent mid/dist stent.  . CLL (chronic lymphocytic leukemia) (HCC)    "never taken any RX; just monitor this twice/year" (03/06/2017)  . Diverticulitis   . GERD (gastroesophageal reflux disease)   . H/O Bell's palsy 1960s  . HFrEF (heart failure with reduced ejection fraction) (HCC)   . High cholesterol   . History of kidney stones   . HOH (hard of hearing)   . Iron deficiency anemia   . Ischemic cardiomyopathy    a. 01/2016 EF 25-30%; b. 04/2016 f/u echo: EF 36%, mild glob HK, basal and mid inflat, inf, infsept, mid apical inf AK, mildly dil LA/RA, mild AI/MR, trace TR; c. 06/2016 Cardiac MRI: EF 36%, apical AK, mod diff HK, septal-lateral dyssynchrony  . LBBB (left bundle branch block)   . Mobitz  type 2 second degree AV block 03/06/2017  . Myocardial infarction (HCC) 01/27/2016  . Presence of permanent cardiac pacemaker   . Skin cancer    a. on head years ago  . Tachy-brady syndrome (HCC)    a. 02/2017 s/p SJM 3262 Quadra Allur MP CRT-P.    PAST SURGICAL  HISTORY: Past Surgical History:  Procedure Laterality Date  . BACK SURGERY    . BI-VENTRICULAR PACEMAKER INSERTION (CRT-P)  03/06/2017  . BIV PACEMAKER INSERTION CRT-P N/A 03/06/2017   Procedure: BIV PACEMAKER INSERTION CRT-P;  Surgeon: Marinus Maw, MD;  Location: [facility] INVASIVE CV LAB;  Service: Cardiovascular;  Laterality: N/A;  . CARDIAC CATHETERIZATION N/A 07/29/2014   Procedure: Left Heart Cath;  Surgeon: Dalia Heading, MD;  Location: [facility] INVASIVE CV LAB;  Service: Cardiovascular;  Laterality: N/A;  . CARDIAC CATHETERIZATION N/A 01/27/2016   Procedure: Left Heart Cath and Coronary Angiography;  Surgeon: Yates Decamp, MD;  Location: [facility] INVASIVE CV LAB;  Service: Cardiovascular;  Laterality: N/A;  . CARDIAC CATHETERIZATION N/A 01/27/2016   Procedure: Coronary Stent Intervention;  Surgeon: Yates Decamp, MD;  Location: [facility] INVASIVE CV LAB;  Service: Cardiovascular;  Laterality: N/A;  . CARDIAC CATHETERIZATION N/A 02/13/2016   Procedure: Coronary Stent Intervention;  Surgeon: Yates Decamp, MD;  Location: [facility] INVASIVE CV LAB;  Service: Cardiovascular;  Laterality: N/A;  . CARDIAC CATHETERIZATION N/A 02/13/2016   Procedure: Left Heart Cath and Coronary Angiography;  Surgeon: Yates Decamp, MD;  Location: [facility] INVASIVE CV LAB;  Service: Cardiovascular;  Laterality: N/A;  . CARDIAC CATHETERIZATION    . CATARACT EXTRACTION W/PHACO Right 07/18/2014   Procedure: CATARACT EXTRACTION PHACO AND INTRAOCULAR LENS PLACEMENT (IOC);  Surgeon: Sallee Lange, MD;  Location: [facility] ORS;  Service: Ophthalmology;  Laterality: Right;  [place] 03:11 AP% 28.4 CDE 75.64  . CATARACT EXTRACTION W/PHACO Left 06/19/2015   Procedure: CATARACT EXTRACTION PHACO AND INTRAOCULAR LENS PLACEMENT (IOC);  Surgeon: Sallee Lange, MD;  Location: [facility] ORS;  Service: Ophthalmology;  Laterality: Left;  [place] 02:36.5 AP% 27.7 CDE 69.51 fluid pack lot # [id number]  . COLONOSCOPY WITH PROPOFOL N/A 04/20/2018   Procedure: COLONOSCOPY WITH PROPOFOL;  Surgeon:  Scot Jun, MD;  Location: [facility] ENDOSCOPY;  Service: Endoscopy;  Laterality: N/A;  . ESOPHAGOGASTRODUODENOSCOPY (EGD) WITH PROPOFOL N/A 04/20/2018   Procedure: ESOPHAGOGASTRODUODENOSCOPY (EGD) WITH PROPOFOL;  Surgeon: Scot Jun, MD;  Location: [facility] ENDOSCOPY;  Service: Endoscopy;  Laterality: N/A;  . EYE SURGERY    . INGUINAL HERNIA REPAIR Left 2010  . INSERT / REPLACE / REMOVE PACEMAKER    . LEFT HEART CATH AND CORONARY ANGIOGRAPHY N/A 11/18/2016   Procedure: LEFT HEART CATH AND CORONARY ANGIOGRAPHY;  Surgeon: Iran Ouch, MD;  Location: [facility] INVASIVE CV LAB;  Service: Cardiovascular;  Laterality: N/A;  . LUMBAR DISC SURGERY  1983   "ruptured disc; took a piece off my buttocks and fixed it"  . SKIN CANCER EXCISION     "top of my head"  . TONSILLECTOMY      FAMILY HISTORY: Reviewed and unchanged. No reported history of malignancy or chronic disease.     ADVANCED DIRECTIVES:    HEALTH MAINTENANCE: Social History   Tobacco Use  . Smoking status: Former    Types: Pipe, Cigars    Quit date: 1960    Years since quitting: 65.0  . Smokeless tobacco: Former    Types: Chew    Quit date: 1960  Advertising account planner  . Vaping status: Never Used  Substance Use Topics  .  Alcohol use: No  . Drug use: Never     Colonoscopy:  PAP:  Bone density:  Lipid panel:  Allergies  Allergen Reactions  . Niaspan [Niacin Er (Antihyperlipidemic)] Anaphylaxis and Other (See Comments)    Episode happened 10 to 15 years ago.  Has not tried any other similar meds since  . Adhesive [Tape] Other (See Comments)    Skin is very sensitive; PLEASE USE PAPER TAPE!!    Current Outpatient Medications  Medication Sig Dispense Refill  . aspirin EC 81 MG tablet Take 1 tablet (81 mg total) by mouth daily. Swallow whole. 30 tablet 12  . atorvastatin (LIPITOR) 80 MG tablet Take 1 tablet (80 mg total) by mouth every evening. 90 tablet 3  . Calcium Carbonate-Vit D-Min (CALTRATE PLUS PO) Take by mouth 2  (two) times daily. Taking tablets  2 daily    . carvedilol (COREG) 3.125 MG tablet TAKE 1 TABLET BY MOUTH TWICE DAILY 180 tablet 2  . co-enzyme Q-10 30 MG capsule Take 30 mg by mouth daily.    . Cyanocobalamin (B-12) 500 MCG SUBL Place 500 mcg under the tongue once a week.     . ferrous gluconate (FERGON) 324 MG tablet Takes 1 tablet PO twice per week    . JARDIANCE 10 MG TABS tablet TAKE ONE TABLET EVERY DAY 100 tablet 1  . lisinopril (PRINIVIL,ZESTRIL) 5 MG tablet Take 2.5 mg by mouth daily.    . nitroGLYCERIN (NITROSTAT) 0.4 MG SL tablet Place 1 tablet (0.4 mg total) under the tongue every 5 (five) minutes x 3 doses as needed for chest pain. 25 tablet 3  . pantoprazole (PROTONIX) 40 MG tablet Take 40 mg by mouth daily. Taking 1 tablet daily    . spironolactone (ALDACTONE) 25 MG tablet TAKE 1/2 TABLET BY MOUTH ONCE DAILY 45 tablet 1   No current facility-administered medications for this visit.    OBJECTIVE: There were no vitals filed for this visit.   There is no height or weight on file to calculate BMI.    ECOG FS:0 - Asymptomatic  General: Well-developed, well-nourished, no acute distress. HEENT: Normocephalic. Neuro: Alert, answering all questions appropriately. Cranial nerves grossly intact. Psych: Normal affect.  LAB RESULTS:  Lab Results  Component Value Date   NA 138 02/05/2022   K 4.3 02/05/2022   CL 104 02/05/2022   CO2 26 02/05/2022   GLUCOSE 103 (H) 02/05/2022   BUN 17 02/05/2022   CREATININE 1.08 02/05/2022   CALCIUM 8.5 (L) 02/05/2022   PROT 6.4 (L) 02/05/2022   ALBUMIN 3.6 02/05/2022   AST 20 02/05/2022   ALT 16 02/05/2022   ALKPHOS 65 02/05/2022   BILITOT 0.7 02/05/2022   GFRNONAA >60 02/05/2022   GFRAA >60 12/11/2017    Lab Results  Component Value Date   WBC 9.2 02/10/2023   NEUTROABS 2.5 02/10/2023   HGB 13.2 02/10/2023   HCT 39.7 02/10/2023   MCV 98.5 02/10/2023   PLT 207 02/10/2023   Lab Results  Component Value Date   IRON 111 02/10/2023    TIBC 249 (L) 02/10/2023   IRONPCTSAT 45 (H) 02/10/2023   Lab Results  Component Value Date   FERRITIN 48 02/10/2023     STUDIES: No results found.  ASSESSMENT: CLL, Rai stage 0.  Iron deficiency anemia.  PLAN:    1.  CLL: Patient's white blood cell count continues to be within normal limits today at 9.1.  Since January 2014 is count has ranged from 7.4-21.9. Previously,  flow cytometry confirmed a monoclonal B-cell population consistent with CLL. CT in August 2014 was reviewed independently and did not reveal any underlying lymphadenopathy.  No intervention is needed.  Patient does not require additional imaging or a bone marrow biopsy unless there is suspicion of progression of disease.  Return to clinic in 1 year with repeat laboratory work and video assisted telemedicine visit. 2.  Iron deficiency anemia: Resolved.  Patient's hemoglobin and iron stores continue to be within normal limits.  Colonoscopy and EGD on April 20, 2018 did not reveal any distinct pathology or evidence of bleeding.  Patient does not require additional IV Feraheme.  He last received treatment on May 26, 2018.  Continue oral iron supplementation.  Return to clinic in 1 year as above.  I provided 20 minutes of face-to-face video visit time during this encounter which included chart review, counseling, and coordination of care as documented above.    Patient expressed understanding and was in agreement with this plan. He also understands that He can call clinic at any time with any questions, concerns, or complaints.    Jeralyn Ruths, MD   02/11/2023 3:09 PM

## 2023-02-12 ENCOUNTER — Encounter: Payer: Self-pay | Admitting: Oncology

## 2023-02-21 LAB — CUP PACEART REMOTE DEVICE CHECK
Battery Remaining Longevity: 1 mo
Battery Remaining Percentage: 2 %
Battery Voltage: 2.68 V
Brady Statistic AP VP Percent: 88 %
Brady Statistic AP VS Percent: 1 %
Brady Statistic AS VP Percent: 10 %
Brady Statistic AS VS Percent: 1 %
Brady Statistic RA Percent Paced: 88 %
Date Time Interrogation Session: 20241215032412
Implantable Lead Connection Status: 753985
Implantable Lead Connection Status: 753985
Implantable Lead Connection Status: 753985
Implantable Lead Implant Date: 20190110
Implantable Lead Implant Date: 20190110
Implantable Lead Implant Date: 20190110
Implantable Lead Location: 753858
Implantable Lead Location: 753859
Implantable Lead Location: 753860
Implantable Pulse Generator Implant Date: 20190110
Lead Channel Impedance Value: 380 Ohm
Lead Channel Impedance Value: 400 Ohm
Lead Channel Impedance Value: 590 Ohm
Lead Channel Pacing Threshold Amplitude: 0.5 V
Lead Channel Pacing Threshold Amplitude: 0.75 V
Lead Channel Pacing Threshold Amplitude: 1.75 V
Lead Channel Pacing Threshold Pulse Width: 0.5 ms
Lead Channel Pacing Threshold Pulse Width: 0.5 ms
Lead Channel Pacing Threshold Pulse Width: 1.5 ms
Lead Channel Sensing Intrinsic Amplitude: 11.9 mV
Lead Channel Sensing Intrinsic Amplitude: 2.1 mV
Lead Channel Setting Pacing Amplitude: 2 V
Lead Channel Setting Pacing Amplitude: 2.25 V
Lead Channel Setting Pacing Amplitude: 2.5 V
Lead Channel Setting Pacing Pulse Width: 0.5 ms
Lead Channel Setting Pacing Pulse Width: 1.5 ms
Lead Channel Setting Sensing Sensitivity: 2 mV
Pulse Gen Model: 3262
Pulse Gen Serial Number: 8942222

## 2023-03-10 ENCOUNTER — Ambulatory Visit (INDEPENDENT_AMBULATORY_CARE_PROVIDER_SITE_OTHER): Payer: PPO

## 2023-03-10 DIAGNOSIS — I442 Atrioventricular block, complete: Secondary | ICD-10-CM | POA: Diagnosis not present

## 2023-03-11 LAB — CUP PACEART REMOTE DEVICE CHECK
Battery Remaining Longevity: 1 mo
Battery Remaining Percentage: 0.5 %
Battery Voltage: 2.63 V
Brady Statistic AP VP Percent: 88 %
Brady Statistic AP VS Percent: 1 %
Brady Statistic AS VP Percent: 10 %
Brady Statistic AS VS Percent: 1 %
Brady Statistic RA Percent Paced: 88 %
Date Time Interrogation Session: 20250113020016
Implantable Lead Connection Status: 753985
Implantable Lead Connection Status: 753985
Implantable Lead Connection Status: 753985
Implantable Lead Implant Date: 20190110
Implantable Lead Implant Date: 20190110
Implantable Lead Implant Date: 20190110
Implantable Lead Location: 753858
Implantable Lead Location: 753859
Implantable Lead Location: 753860
Implantable Pulse Generator Implant Date: 20190110
Lead Channel Impedance Value: 410 Ohm
Lead Channel Impedance Value: 410 Ohm
Lead Channel Impedance Value: 710 Ohm
Lead Channel Pacing Threshold Amplitude: 0.5 V
Lead Channel Pacing Threshold Amplitude: 0.75 V
Lead Channel Pacing Threshold Amplitude: 1.75 V
Lead Channel Pacing Threshold Pulse Width: 0.5 ms
Lead Channel Pacing Threshold Pulse Width: 0.5 ms
Lead Channel Pacing Threshold Pulse Width: 1.5 ms
Lead Channel Sensing Intrinsic Amplitude: 12 mV
Lead Channel Sensing Intrinsic Amplitude: 2.4 mV
Lead Channel Setting Pacing Amplitude: 2 V
Lead Channel Setting Pacing Amplitude: 2.25 V
Lead Channel Setting Pacing Amplitude: 2.5 V
Lead Channel Setting Pacing Pulse Width: 0.5 ms
Lead Channel Setting Pacing Pulse Width: 1.5 ms
Lead Channel Setting Sensing Sensitivity: 2 mV
Pulse Gen Model: 3262
Pulse Gen Serial Number: 8942222

## 2023-03-17 DIAGNOSIS — Z125 Encounter for screening for malignant neoplasm of prostate: Secondary | ICD-10-CM | POA: Diagnosis not present

## 2023-03-17 DIAGNOSIS — M818 Other osteoporosis without current pathological fracture: Secondary | ICD-10-CM | POA: Diagnosis not present

## 2023-03-17 DIAGNOSIS — E782 Mixed hyperlipidemia: Secondary | ICD-10-CM | POA: Diagnosis not present

## 2023-03-17 DIAGNOSIS — E538 Deficiency of other specified B group vitamins: Secondary | ICD-10-CM | POA: Diagnosis not present

## 2023-03-17 DIAGNOSIS — R739 Hyperglycemia, unspecified: Secondary | ICD-10-CM | POA: Diagnosis not present

## 2023-03-19 NOTE — Progress Notes (Signed)
Remote pacemaker transmission.   

## 2023-03-19 NOTE — Addendum Note (Signed)
Addended by: Geralyn Flash D on: 03/19/2023 01:59 PM   Modules accepted: Orders, Level of Service

## 2023-03-24 DIAGNOSIS — I700 Atherosclerosis of aorta: Secondary | ICD-10-CM | POA: Diagnosis not present

## 2023-03-24 DIAGNOSIS — E782 Mixed hyperlipidemia: Secondary | ICD-10-CM | POA: Diagnosis not present

## 2023-03-24 DIAGNOSIS — I420 Dilated cardiomyopathy: Secondary | ICD-10-CM | POA: Diagnosis not present

## 2023-03-24 DIAGNOSIS — I495 Sick sinus syndrome: Secondary | ICD-10-CM | POA: Diagnosis not present

## 2023-03-24 DIAGNOSIS — Z0000 Encounter for general adult medical examination without abnormal findings: Secondary | ICD-10-CM | POA: Diagnosis not present

## 2023-03-24 DIAGNOSIS — C9110 Chronic lymphocytic leukemia of B-cell type not having achieved remission: Secondary | ICD-10-CM | POA: Diagnosis not present

## 2023-03-24 DIAGNOSIS — E538 Deficiency of other specified B group vitamins: Secondary | ICD-10-CM | POA: Diagnosis not present

## 2023-04-11 LAB — CUP PACEART REMOTE DEVICE CHECK
Battery Remaining Longevity: 1 mo
Battery Remaining Percentage: 0.5 %
Battery Voltage: 2.6 V
Brady Statistic AP VP Percent: 88 %
Brady Statistic AP VS Percent: 1 %
Brady Statistic AS VP Percent: 10 %
Brady Statistic AS VS Percent: 1 %
Brady Statistic RA Percent Paced: 88 %
Date Time Interrogation Session: 20250213020023
Implantable Lead Connection Status: 753985
Implantable Lead Connection Status: 753985
Implantable Lead Connection Status: 753985
Implantable Lead Implant Date: 20190110
Implantable Lead Implant Date: 20190110
Implantable Lead Implant Date: 20190110
Implantable Lead Location: 753858
Implantable Lead Location: 753859
Implantable Lead Location: 753860
Implantable Pulse Generator Implant Date: 20190110
Lead Channel Impedance Value: 410 Ohm
Lead Channel Impedance Value: 440 Ohm
Lead Channel Impedance Value: 680 Ohm
Lead Channel Pacing Threshold Amplitude: 0.5 V
Lead Channel Pacing Threshold Amplitude: 0.75 V
Lead Channel Pacing Threshold Amplitude: 1.75 V
Lead Channel Pacing Threshold Pulse Width: 0.5 ms
Lead Channel Pacing Threshold Pulse Width: 0.5 ms
Lead Channel Pacing Threshold Pulse Width: 1.5 ms
Lead Channel Sensing Intrinsic Amplitude: 10.8 mV
Lead Channel Sensing Intrinsic Amplitude: 2.8 mV
Lead Channel Setting Pacing Amplitude: 2 V
Lead Channel Setting Pacing Amplitude: 2.25 V
Lead Channel Setting Pacing Amplitude: 2.5 V
Lead Channel Setting Pacing Pulse Width: 0.5 ms
Lead Channel Setting Pacing Pulse Width: 1.5 ms
Lead Channel Setting Sensing Sensitivity: 2 mV
Pulse Gen Model: 3262
Pulse Gen Serial Number: 8942222

## 2023-04-14 ENCOUNTER — Ambulatory Visit (INDEPENDENT_AMBULATORY_CARE_PROVIDER_SITE_OTHER): Payer: PPO

## 2023-04-14 DIAGNOSIS — I442 Atrioventricular block, complete: Secondary | ICD-10-CM

## 2023-04-23 ENCOUNTER — Encounter: Payer: Self-pay | Admitting: Internal Medicine

## 2023-04-23 NOTE — Progress Notes (Signed)
 Remote pacemaker transmission.

## 2023-04-23 NOTE — Addendum Note (Signed)
 Addended by: Geralyn Flash D on: 04/23/2023 10:24 AM   Modules accepted: Orders

## 2023-05-02 ENCOUNTER — Telehealth: Payer: Self-pay

## 2023-05-02 NOTE — Telephone Encounter (Signed)
 Alert remote transmission: Device reached ERI 3/5 - route to triage HF diagnostics currently abnormal 15 AMS EGM's , longest 15sec c/w AF/AFL, no OAC per PA reports Follow up as scheduled. LA, CVRS  AT episodes. HF diagnostics look okay. Has appointment 05/29/23 w/ SK. Called pt to notify of ERI and offer device clinic appointment to disable alert. Pt hasn't felt the alert yet. Appreciative for call. If he feels the alert and wants it turned off he can call us. He verbalized understanding.

## 2023-05-07 DIAGNOSIS — M8588 Other specified disorders of bone density and structure, other site: Secondary | ICD-10-CM | POA: Diagnosis not present

## 2023-05-12 ENCOUNTER — Ambulatory Visit (INDEPENDENT_AMBULATORY_CARE_PROVIDER_SITE_OTHER): Payer: PPO

## 2023-05-12 DIAGNOSIS — Z961 Presence of intraocular lens: Secondary | ICD-10-CM | POA: Diagnosis not present

## 2023-05-12 DIAGNOSIS — I442 Atrioventricular block, complete: Secondary | ICD-10-CM

## 2023-05-12 LAB — CUP PACEART REMOTE DEVICE CHECK
Battery Remaining Longevity: 0 mo
Battery Voltage: 2.57 V
Brady Statistic AP VP Percent: 88 %
Brady Statistic AP VS Percent: 1 %
Brady Statistic AS VP Percent: 10 %
Brady Statistic AS VS Percent: 1 %
Brady Statistic RA Percent Paced: 87 %
Date Time Interrogation Session: 20250317020013
Implantable Lead Connection Status: 753985
Implantable Lead Connection Status: 753985
Implantable Lead Connection Status: 753985
Implantable Lead Implant Date: 20190110
Implantable Lead Implant Date: 20190110
Implantable Lead Implant Date: 20190110
Implantable Lead Location: 753858
Implantable Lead Location: 753859
Implantable Lead Location: 753860
Implantable Pulse Generator Implant Date: 20190110
Lead Channel Impedance Value: 380 Ohm
Lead Channel Impedance Value: 400 Ohm
Lead Channel Impedance Value: 600 Ohm
Lead Channel Pacing Threshold Amplitude: 0.5 V
Lead Channel Pacing Threshold Amplitude: 0.75 V
Lead Channel Pacing Threshold Amplitude: 1.75 V
Lead Channel Pacing Threshold Pulse Width: 0.5 ms
Lead Channel Pacing Threshold Pulse Width: 0.5 ms
Lead Channel Pacing Threshold Pulse Width: 1.5 ms
Lead Channel Sensing Intrinsic Amplitude: 12 mV
Lead Channel Sensing Intrinsic Amplitude: 3.6 mV
Lead Channel Setting Pacing Amplitude: 2 V
Lead Channel Setting Pacing Amplitude: 2.25 V
Lead Channel Setting Pacing Amplitude: 2.5 V
Lead Channel Setting Pacing Pulse Width: 0.5 ms
Lead Channel Setting Pacing Pulse Width: 1.5 ms
Lead Channel Setting Sensing Sensitivity: 2 mV
Pulse Gen Model: 3262
Pulse Gen Serial Number: 8942222

## 2023-05-13 ENCOUNTER — Encounter: Payer: Self-pay | Admitting: Internal Medicine

## 2023-05-20 DIAGNOSIS — M810 Age-related osteoporosis without current pathological fracture: Secondary | ICD-10-CM | POA: Diagnosis not present

## 2023-05-26 NOTE — Progress Notes (Signed)
 Remote pacemaker transmission.

## 2023-05-29 ENCOUNTER — Encounter: Payer: Self-pay | Admitting: Internal Medicine

## 2023-05-29 ENCOUNTER — Ambulatory Visit: Payer: PPO | Attending: Internal Medicine | Admitting: Internal Medicine

## 2023-05-29 VITALS — BP 110/60 | HR 55 | Ht 71.0 in | Wt 184.0 lb

## 2023-05-29 DIAGNOSIS — Z950 Presence of cardiac pacemaker: Secondary | ICD-10-CM | POA: Diagnosis not present

## 2023-05-29 DIAGNOSIS — I495 Sick sinus syndrome: Secondary | ICD-10-CM

## 2023-05-29 DIAGNOSIS — I442 Atrioventricular block, complete: Secondary | ICD-10-CM | POA: Diagnosis not present

## 2023-05-29 DIAGNOSIS — I447 Left bundle-branch block, unspecified: Secondary | ICD-10-CM | POA: Diagnosis not present

## 2023-05-29 DIAGNOSIS — I420 Dilated cardiomyopathy: Secondary | ICD-10-CM | POA: Diagnosis not present

## 2023-05-29 DIAGNOSIS — R001 Bradycardia, unspecified: Secondary | ICD-10-CM

## 2023-05-29 LAB — PACEMAKER DEVICE OBSERVATION

## 2023-05-29 NOTE — Progress Notes (Signed)
 And     Patient Care Team: Danella Penton, MD as PCP - General (Internal Medicine) Duke Salvia, MD as PCP - Cardiology (Cardiology) Jeralyn Ruths, MD as Consulting Physician (Oncology)   HPI  Christopher Daniels is a 89 y.o. male seen in followup of sinus bradycardia; left ventricular dysfunction in the setting of a recent MI. Has LBBB;  he is status post Abbott CRT-P implantation (GT) 2019 when he presented with higher grade heart block.  Ischemic heart disease prior stenting.  12/17 STEMI  On Ticagrelor as single antiplatelet agent     The patient denies chest pain, shortness of breath, nocturnal dyspnea, orthopnea or peripheral edema.  There have been no palpitations, lightheadedness or syncope.    Worried about his pacemaker battery running out of "gas."    DATE TEST EF   12/17 LHC 25-30% RCA  Stent 100% >> 0  LAD Stent 80% >>0  5/18 cMRI   36 % + LGE apical septum--normal LV thickness  5/18 Echo   30-35 % LVH 1.3 cm  9/18 LHC  Patent Stents  2/22 Echo  40-45% LAE  mild AI mild-mod  3/23 MYOVIEW 52% Fixed perfusion defect       Date Cr K LDL Hgb  4/18  0.79 4.5     6/18  1.0 4.5  13.7  1/19 0.9 3.8    10/19 1.04 4.3  10.9 (11.2-12/19)  12/20 1.1 4.6  12.3  1/22 1.2 4.3 47 12.3  12/23 1.08 4.3  14.2  1/25 1.1 4.5 62 14.5           Past Medical History:  Diagnosis Date   Arthritis    "joints; fingers, knees" (03/06/2017)   CAD (coronary artery disease)    a. 01/2016 Inf STEMI->PCI/Thrombectomy/DES to mid RCA (4.0 x 30 Resolute DES), EF 25-30%; b. 01/2016 Staged PCI/DES to mLAD (3.0x22 Resolute DES); c. 10/2016 Cath: LM nl, LAD patent stent, 30d, D2 70, LCX nl, OM1/2 nl, RCA patent mid/dist stent.   CLL (chronic lymphocytic leukemia) (HCC)    "never taken any RX; just monitor this twice/year" (03/06/2017)   Diverticulitis    GERD (gastroesophageal reflux disease)    H/O Bell's palsy 1960s   HFrEF (heart failure with reduced ejection fraction) (HCC)     High cholesterol    History of kidney stones    HOH (hard of hearing)    Iron deficiency anemia    Ischemic cardiomyopathy    a. 01/2016 EF 25-30%; b. 04/2016 f/u echo: EF 36%, mild glob HK, basal and mid inflat, inf, infsept, mid apical inf AK, mildly dil LA/RA, mild AI/MR, trace TR; c. 06/2016 Cardiac MRI: EF 36%, apical AK, mod diff HK, septal-lateral dyssynchrony   LBBB (left bundle branch block)    Mobitz type 2 second degree AV block 03/06/2017   Myocardial infarction (HCC) 01/27/2016   Presence of permanent cardiac pacemaker    Skin cancer    a. on head years ago   Tachy-brady syndrome (HCC)    a. 02/2017 s/p SJM 3262 Quadra Allur MP CRT-P.    Past Surgical History:  Procedure Laterality Date   BACK SURGERY     BI-VENTRICULAR PACEMAKER INSERTION (CRT-P)  03/06/2017   BIV PACEMAKER INSERTION CRT-P N/A 03/06/2017   Procedure: BIV PACEMAKER INSERTION CRT-P;  Surgeon: Marinus Maw, MD;  Location: [facility] INVASIVE CV LAB;  Service: Cardiovascular;  Laterality: N/A;   CARDIAC CATHETERIZATION N/A 07/29/2014   Procedure: Left Heart Cath;  Surgeon: Dalia Heading, MD;  Location: [facility] INVASIVE CV LAB;  Service: Cardiovascular;  Laterality: N/A;   CARDIAC CATHETERIZATION N/A 01/27/2016   Procedure: Left Heart Cath and Coronary Angiography;  Surgeon: Yates Decamp, MD;  Location: [facility] INVASIVE CV LAB;  Service: Cardiovascular;  Laterality: N/A;   CARDIAC CATHETERIZATION N/A 01/27/2016   Procedure: Coronary Stent Intervention;  Surgeon: Yates Decamp, MD;  Location: [facility] INVASIVE CV LAB;  Service: Cardiovascular;  Laterality: N/A;   CARDIAC CATHETERIZATION N/A 02/13/2016   Procedure: Coronary Stent Intervention;  Surgeon: Yates Decamp, MD;  Location: [facility] INVASIVE CV LAB;  Service: Cardiovascular;  Laterality: N/A;   CARDIAC CATHETERIZATION N/A 02/13/2016   Procedure: Left Heart Cath and Coronary Angiography;  Surgeon: Yates Decamp, MD;  Location: [facility] INVASIVE CV LAB;  Service: Cardiovascular;  Laterality: N/A;   CARDIAC  CATHETERIZATION     CATARACT EXTRACTION W/PHACO Right 07/18/2014   Procedure: CATARACT EXTRACTION PHACO AND INTRAOCULAR LENS PLACEMENT (IOC);  Surgeon: Sallee Lange, MD;  Location: [facility] ORS;  Service: Ophthalmology;  Laterality: Right;  [place] 03:11 AP% 28.4 CDE 75.64   CATARACT EXTRACTION W/PHACO Left 06/19/2015   Procedure: CATARACT EXTRACTION PHACO AND INTRAOCULAR LENS PLACEMENT (IOC);  Surgeon: Sallee Lange, MD;  Location: [facility] ORS;  Service: Ophthalmology;  Laterality: Left;  [place] 02:36.5 AP% 27.7 CDE 69.51 fluid pack lot # [id number]   COLONOSCOPY WITH PROPOFOL N/A 04/20/2018   Procedure: COLONOSCOPY WITH PROPOFOL;  Surgeon: Scot Jun, MD;  Location: [facility] ENDOSCOPY;  Service: Endoscopy;  Laterality: N/A;   ESOPHAGOGASTRODUODENOSCOPY (EGD) WITH PROPOFOL N/A 04/20/2018   Procedure: ESOPHAGOGASTRODUODENOSCOPY (EGD) WITH PROPOFOL;  Surgeon: Scot Jun, MD;  Location: [facility] ENDOSCOPY;  Service: Endoscopy;  Laterality: N/A;   EYE SURGERY     INGUINAL HERNIA REPAIR Left 2010   INSERT / REPLACE / REMOVE PACEMAKER     LEFT HEART CATH AND CORONARY ANGIOGRAPHY N/A 11/18/2016   Procedure: LEFT HEART CATH AND CORONARY ANGIOGRAPHY;  Surgeon: Iran Ouch, MD;  Location: [facility] INVASIVE CV LAB;  Service: Cardiovascular;  Laterality: N/A;   LUMBAR DISC SURGERY  1983   "ruptured disc; took a piece off my buttocks and fixed it"   SKIN CANCER EXCISION     "top of my head"   TONSILLECTOMY      Current Outpatient Medications  Medication Sig Dispense Refill   aspirin EC 81 MG tablet Take 1 tablet (81 mg total) by mouth daily. Swallow whole. 30 tablet 12   atorvastatin (LIPITOR) 80 MG tablet Take 1 tablet (80 mg total) by mouth every evening. 90 tablet 3   Calcium Carbonate-Vit D-Min (CALTRATE PLUS PO) Take by mouth 2 (two) times daily. Taking tablets  2 daily     carvedilol (COREG) 3.125 MG tablet TAKE 1 TABLET BY MOUTH TWICE DAILY 180 tablet 2   co-enzyme Q-10 30 MG capsule Take 30 mg  by mouth daily.     Cyanocobalamin (B-12) 500 MCG SUBL Place 500 mcg under the tongue once a week.      ferrous gluconate (FERGON) 324 MG tablet Takes 1 tablet PO twice per week     JARDIANCE 10 MG TABS tablet TAKE ONE TABLET EVERY DAY 100 tablet 1   lisinopril (PRINIVIL,ZESTRIL) 5 MG tablet Take 2.5 mg by mouth daily.     nitroGLYCERIN (NITROSTAT) 0.4 MG SL tablet Place 1 tablet (0.4 mg total) under the tongue every 5 (five) minutes x 3 doses as needed for chest pain. 25 tablet 3   pantoprazole (PROTONIX) 40 MG  tablet Take 40 mg by mouth daily. Taking 1 tablet daily     spironolactone (ALDACTONE) 25 MG tablet TAKE 1/2 TABLET BY MOUTH ONCE DAILY 45 tablet 1   No current facility-administered medications for this visit.    Allergies  Allergen Reactions   Niaspan [Niacin Er (Antihyperlipidemic)] Anaphylaxis and Other (See Comments)    Episode happened 10 to 15 years ago.  Has not tried any other similar meds since   Adhesive [Tape] Other (See Comments)    Skin is very sensitive; PLEASE USE PAPER TAPE!!      ROS:  Please see the history of present illness.     All other systems reviewed and negative.    BP 110/60 (BP Location: Left Arm, Patient Position: Sitting, Cuff Size: Normal)   Pulse (!) 55   Ht 5\' 11"  (1.803 m)   Wt 184 lb (83.5 kg)   SpO2 96%   BMI 25.66 kg/m  Well developed and well nourished in no acute distress HENT normal Neck supple with JVP-flat Clear Device pocket well healed; without hematoma or erythema.  There is no tethering  Regular rate and rhythm, no  gallop No  murmur Abd-soft with active BS No Clubbing cyanosis  edema Skin-warm and dry A & Oriented  Grossly normal sensory and motor function  ECG AV pacing   Device function >> abnormal, battery at [employer] changes none  See Paceart for details      Assessment and Plan:  Sinus bradycardia    High-grade heart block intermittent  Ischemic cardiomyopathy  status post DES 2   12/17  EF 36%  MRI 5/18   Congestive heart failure class II  Left bundle branch block  CRT-pacemaker St Jude    Hyperlipidemia  Orthostatic hypotension  Low voltage    Battery is at [employer].  We have reviewed the benefits and risks of generator replacement.  These include but are not limited to lead fracture and infection.  The patient understands, agrees and is willing to proceed.   With stents in place, will not hold ASA  Orthostatic symptoms quiescent.  No symptoms of ischemia, continue ASA, carvedilol lisinopril and spiro and jardiance.  Continue lipitor

## 2023-05-29 NOTE — Patient Instructions (Signed)
 Medication Instructions:  The current medical regimen is effective;  continue present plan and medications.  *If you need a refill on your cardiac medications before your next appointment, please call your pharmacy*  Lab Work: Your provider would like for you to return 2-3 weeks before procedure to have the following labs drawn: BMET, CBC.   Please go to [facility] [address] ([facility] [address], [place] [address] You do not need an appointment.  They are open from 8 am- 4:30 pm.  Lunch from 1:00 pm- 2:00 pm You do not need to be fasting.   You may also go to one of the following [facility]:  [address] [place], [place] [address] Phone: [phone] Lab hours: Mon-Fri 8 am- 5 pm    Lunch 12 pm- 1 pm  [address] [place],  [place]  [address]  [place] Phone: [phone] Lab hours: 7 am- 4 pm Lunch 12 pm-1 pm   [address] [place],  [place]  [address]  [place] Phone: [phone] Lab hours: Mon-Fri 8 am- 5 pm    Lunch 12 pm- 1 pm  If you have labs (blood work) drawn today and your tests are completely normal, you will receive your results only by: MyChart Message (if you have MyChart) OR A paper copy in the mail If you have any lab test that is abnormal or we need to change your treatment, we will call you to review the results.  Testing/Procedures: GEN CHANGE- follow procedure letter  Follow-Up: At [facility], you and your health needs are our priority.  As part of our continuing mission to provide you with exceptional heart care, our providers are all part of one team.  This team includes your primary Cardiologist (physician) and Advanced Practice Providers or APPs (Physician Assistants and Nurse Practitioners) who all work together to provide you with the care you need, when you need it.  Your next appointment:   They will call you for follow up appointments  We recommend signing up for the patient portal called "MyChart".  Sign up  information is provided on this After Visit Summary.  MyChart is used to connect with patients for Virtual Visits (Telemedicine).  Patients are able to view lab/test results, encounter notes, upcoming appointments, etc.  Non-urgent messages can be sent to your provider as well.   To learn more about what you can do with MyChart, go to [email or web address].

## 2023-06-06 ENCOUNTER — Encounter: Payer: Self-pay | Admitting: Internal Medicine

## 2023-06-06 MED ORDER — NITROGLYCERIN 0.4 MG SL SUBL: 0.4000 mg | SUBLINGUAL_TABLET | SUBLINGUAL | 3 refills | Status: AC | PRN

## 2023-06-12 ENCOUNTER — Ambulatory Visit (INDEPENDENT_AMBULATORY_CARE_PROVIDER_SITE_OTHER)

## 2023-06-12 DIAGNOSIS — R001 Bradycardia, unspecified: Secondary | ICD-10-CM

## 2023-06-14 LAB — CUP PACEART REMOTE DEVICE CHECK
Battery Remaining Longevity: 0 mo
Battery Voltage: 2.56 V
Brady Statistic AP VP Percent: 87 %
Brady Statistic AP VS Percent: 1 %
Brady Statistic AS VP Percent: 11 %
Brady Statistic AS VS Percent: 1 %
Brady Statistic RA Percent Paced: 86 %
Date Time Interrogation Session: 20250417032334
Implantable Lead Connection Status: 753985
Implantable Lead Connection Status: 753985
Implantable Lead Connection Status: 753985
Implantable Lead Implant Date: 20190110
Implantable Lead Implant Date: 20190110
Implantable Lead Implant Date: 20190110
Implantable Lead Location: 753858
Implantable Lead Location: 753859
Implantable Lead Location: 753860
Implantable Pulse Generator Implant Date: 20190110
Lead Channel Impedance Value: 430 Ohm
Lead Channel Impedance Value: 480 Ohm
Lead Channel Impedance Value: 680 Ohm
Lead Channel Pacing Threshold Amplitude: 0.5 V
Lead Channel Pacing Threshold Amplitude: 0.75 V
Lead Channel Pacing Threshold Amplitude: 1.25 V
Lead Channel Pacing Threshold Pulse Width: 0.5 ms
Lead Channel Pacing Threshold Pulse Width: 0.5 ms
Lead Channel Pacing Threshold Pulse Width: 1.5 ms
Lead Channel Sensing Intrinsic Amplitude: 12 mV
Lead Channel Sensing Intrinsic Amplitude: 3.1 mV
Lead Channel Setting Pacing Amplitude: 2 V
Lead Channel Setting Pacing Amplitude: 2.25 V
Lead Channel Setting Pacing Amplitude: 2.5 V
Lead Channel Setting Pacing Pulse Width: 0.5 ms
Lead Channel Setting Pacing Pulse Width: 1.5 ms
Lead Channel Setting Sensing Sensitivity: 2 mV
Pulse Gen Model: 3262
Pulse Gen Serial Number: 8942222

## 2023-06-20 ENCOUNTER — Telehealth: Payer: Self-pay

## 2023-06-20 NOTE — Telephone Encounter (Signed)
 Attempted to contact patient to discuss moving gen change appointment, currently scheduled with Dr.Klein for 05/08 but with him being out on medical leave, we are having to move him to Dr.Parker possibly on 05/06 if patient is able to accommodate this new time.   LVM to call back to discuss.  Left call back number.

## 2023-06-23 ENCOUNTER — Encounter: Payer: Self-pay | Admitting: Cardiology

## 2023-06-23 DIAGNOSIS — I442 Atrioventricular block, complete: Secondary | ICD-10-CM | POA: Diagnosis not present

## 2023-06-23 DIAGNOSIS — M79672 Pain in left foot: Secondary | ICD-10-CM | POA: Diagnosis not present

## 2023-06-23 DIAGNOSIS — L84 Corns and callosities: Secondary | ICD-10-CM | POA: Diagnosis not present

## 2023-06-23 DIAGNOSIS — Q828 Other specified congenital malformations of skin: Secondary | ICD-10-CM | POA: Diagnosis not present

## 2023-06-23 DIAGNOSIS — I495 Sick sinus syndrome: Secondary | ICD-10-CM | POA: Diagnosis not present

## 2023-06-23 DIAGNOSIS — R001 Bradycardia, unspecified: Secondary | ICD-10-CM | POA: Diagnosis not present

## 2023-06-23 DIAGNOSIS — I420 Dilated cardiomyopathy: Secondary | ICD-10-CM | POA: Diagnosis not present

## 2023-06-23 DIAGNOSIS — M21622 Bunionette of left foot: Secondary | ICD-10-CM | POA: Diagnosis not present

## 2023-06-23 DIAGNOSIS — D2372 Other benign neoplasm of skin of left lower limb, including hip: Secondary | ICD-10-CM | POA: Diagnosis not present

## 2023-06-23 DIAGNOSIS — Z950 Presence of cardiac pacemaker: Secondary | ICD-10-CM | POA: Diagnosis not present

## 2023-06-23 DIAGNOSIS — I447 Left bundle-branch block, unspecified: Secondary | ICD-10-CM | POA: Diagnosis not present

## 2023-06-23 NOTE — Telephone Encounter (Signed)
 Called patient back, spoke with wife- advised of the following message below.   Agreed to switch PPM GEN CHANGE appt to 05/06 with Dr.Parker.   Industry ([employer]) was made aware of the change, blood work obtained today (will monitor for results).   Thank you!

## 2023-06-23 NOTE — Telephone Encounter (Signed)
 Patient returned RN's call regarding surgery date change.  Patient noted he will be going into an appointment.

## 2023-06-23 NOTE — Telephone Encounter (Signed)
 Left a message for the patient to call back.

## 2023-06-24 ENCOUNTER — Encounter: Payer: Self-pay | Admitting: Internal Medicine

## 2023-06-24 LAB — BASIC METABOLIC PANEL WITH GFR
BUN/Creatinine Ratio: 13 (ref 10–24)
BUN: 14 mg/dL (ref 8–27)
CO2: 23 mmol/L (ref 20–29)
Calcium: 9.4 mg/dL (ref 8.6–10.2)
Chloride: 103 mmol/L (ref 96–106)
Creatinine, Ser: 1.12 mg/dL (ref 0.76–1.27)
Glucose: 93 mg/dL (ref 70–99)
Potassium: 4.3 mmol/L (ref 3.5–5.2)
Sodium: 141 mmol/L (ref 134–144)
eGFR: 64 mL/min/{1.73_m2} (ref >59)

## 2023-06-24 LAB — CBC
Hematocrit: 41.9 % (ref 37.5–51.0)
Hemoglobin: 13.6 g/dL (ref 13.0–17.7)
MCH: 32.1 pg (ref 26.6–33.0)
MCHC: 32.5 g/dL (ref 31.5–35.7)
MCV: 99 fL — ABNORMAL HIGH (ref 79–97)
Platelets: 216 10*3/uL (ref 150–450)
RBC: 4.24 x10E6/uL (ref 4.14–5.80)
RDW: 12.8 % (ref 11.6–15.4)
WBC: 11.1 10*3/uL — ABNORMAL HIGH (ref 3.4–10.8)

## 2023-06-24 NOTE — Progress Notes (Signed)
 Called patient, discussed with wife-  See previous phone message.   Thank you!

## 2023-07-01 ENCOUNTER — Ambulatory Visit
Admission: RE | Admit: 2023-07-01 | Discharge: 2023-07-01 | Disposition: A | Discharge status: Home / Self Care | Attending: Cardiology | Admitting: Cardiology

## 2023-07-01 ENCOUNTER — Other Ambulatory Visit: Payer: Self-pay

## 2023-07-01 ENCOUNTER — Encounter: Payer: Self-pay | Admitting: Cardiology

## 2023-07-01 ENCOUNTER — Encounter
Admission: RE | Disposition: A | Discharge status: Home / Self Care | Payer: Self-pay | Source: Home / Self Care | Attending: Cardiology

## 2023-07-01 DIAGNOSIS — I5022 Chronic systolic (congestive) heart failure: Secondary | ICD-10-CM | POA: Diagnosis not present

## 2023-07-01 DIAGNOSIS — Z79899 Other long term (current) drug therapy: Secondary | ICD-10-CM | POA: Diagnosis not present

## 2023-07-01 DIAGNOSIS — Z950 Presence of cardiac pacemaker: Secondary | ICD-10-CM

## 2023-07-01 DIAGNOSIS — I255 Ischemic cardiomyopathy: Secondary | ICD-10-CM | POA: Diagnosis not present

## 2023-07-01 DIAGNOSIS — Z45018 Encounter for adjustment and management of other part of cardiac pacemaker: Secondary | ICD-10-CM | POA: Diagnosis not present

## 2023-07-01 DIAGNOSIS — Z45010 Encounter for checking and testing of cardiac pacemaker pulse generator [battery]: Secondary | ICD-10-CM | POA: Diagnosis not present

## 2023-07-01 DIAGNOSIS — I447 Left bundle-branch block, unspecified: Secondary | ICD-10-CM | POA: Insufficient documentation

## 2023-07-01 DIAGNOSIS — I441 Atrioventricular block, second degree: Secondary | ICD-10-CM | POA: Insufficient documentation

## 2023-07-01 DIAGNOSIS — Z87891 Personal history of nicotine dependence: Secondary | ICD-10-CM | POA: Diagnosis not present

## 2023-07-01 HISTORY — PX: PPM GENERATOR CHANGEOUT: EP1233

## 2023-07-01 SURGERY — PPM GENERATOR CHANGEOUT
Anesthesia: Moderate Sedation

## 2023-07-01 MED ORDER — FENTANYL CITRATE (PF) 100 MCG/2ML IJ SOLN
INTRAMUSCULAR | Status: AC
  Filled 2023-07-01: qty 2

## 2023-07-01 MED ORDER — CEFAZOLIN SODIUM-DEXTROSE 2-4 GM/100ML-% IV SOLN
INTRAVENOUS | Status: AC
Start: 2023-07-01 — End: ?
  Filled 2023-07-01: qty 100

## 2023-07-01 MED ORDER — HEPARIN (PORCINE) IN NACL 1000-0.9 UT/500ML-% IV SOLN
INTRAVENOUS | Status: DC | PRN
  Administered 2023-07-01: 500 mL

## 2023-07-01 MED ORDER — CHLORHEXIDINE GLUCONATE 4 % EX SOLN: 4.0000 | Freq: Once | CUTANEOUS | Status: DC

## 2023-07-01 MED ORDER — LIDOCAINE HCL 1 % IJ SOLN
INTRAMUSCULAR | Status: AC
  Filled 2023-07-01: qty 60

## 2023-07-01 MED ORDER — MIDAZOLAM HCL 2 MG/2ML IJ SOLN
INTRAMUSCULAR | Status: DC | PRN
  Administered 2023-07-01 (×2): .5 mg via INTRAVENOUS

## 2023-07-01 MED ORDER — SODIUM CHLORIDE 0.9 % IV SOLN
80.0000 mg | INTRAVENOUS | Status: AC
  Administered 2023-07-01: 80 mg
  Filled 2023-07-01: qty 2

## 2023-07-01 MED ORDER — ACETAMINOPHEN 325 MG PO TABS: 325.0000 mg | ORAL_TABLET | ORAL | Status: DC | PRN

## 2023-07-01 MED ORDER — FENTANYL CITRATE (PF) 100 MCG/2ML IJ SOLN
INTRAMUSCULAR | Status: DC | PRN
  Administered 2023-07-01 (×2): 25 ug via INTRAVENOUS

## 2023-07-01 MED ORDER — MIDAZOLAM HCL 2 MG/2ML IJ SOLN
INTRAMUSCULAR | Status: AC
  Filled 2023-07-01: qty 2

## 2023-07-01 MED ORDER — SODIUM CHLORIDE 0.9 % IV SOLN: INTRAVENOUS | Status: DC

## 2023-07-01 MED ORDER — CHLORHEXIDINE GLUCONATE CLOTH 2 % EX PADS: 6.0000 | MEDICATED_PAD | Freq: Every day | CUTANEOUS | Status: DC

## 2023-07-01 MED ORDER — ONDANSETRON HCL 4 MG/2ML IJ SOLN: 4.0000 mg | Freq: Four times a day (QID) | INTRAMUSCULAR | Status: DC | PRN

## 2023-07-01 MED ORDER — LIDOCAINE HCL (PF) 1 % IJ SOLN
INTRAMUSCULAR | Status: DC | PRN
Start: 2023-07-01 — End: 2023-07-01
  Administered 2023-07-01: 20 mL

## 2023-07-01 MED ORDER — CEFAZOLIN SODIUM-DEXTROSE 2-4 GM/100ML-% IV SOLN
2.0000 g | INTRAVENOUS | Status: AC
  Administered 2023-07-01: 2 g via INTRAVENOUS

## 2023-07-01 SURGICAL SUPPLY — 9 items
CABLE SURG 12 DISP A/V CHANNEL (MISCELLANEOUS) IMPLANT
DEVICE DSSCT PLSMBLD 3.0S LGHT (MISCELLANEOUS) IMPLANT
PACEMAKER QUDR ALLR CRT PM3562 (Pacemaker) IMPLANT
PAD ELECT DEFIB RADIOL ZOLL (MISCELLANEOUS) IMPLANT
POUCH AIGIS-R ANTIBACT PPM (Mesh General) ×1 IMPLANT
POUCH AIGIS-R ANTIBACT PPM MED (Mesh General) IMPLANT
SUT MNCRL AB 4-0 PS2 18 (SUTURE) IMPLANT
SUT VIC AB 2-0 CT1 TAPERPNT 27 (SUTURE) IMPLANT
TRAY PACEMAKER INSERTION (PACKS) ×1 IMPLANT

## 2023-07-01 NOTE — Discharge Instructions (Addendum)

## 2023-07-01 NOTE — H&P (Addendum)
 Patient Name: Christopher Daniels Date of Encounter: 07/01/2023 [facility] Cardiologist: Richardo Chandler, MD   HPI  .    Mr. Jacks is an 89 year old male with a past medical history notable for chronic systolic heart failure secondary to ischemic cardiomyopathy, LBBB and high grade AV block who underwent CRT-P implant in 2019. He has reached ERI. He presents today for scheduled generator change. He reports feeling well and has no new or acute complaints today.   Past Medical History:  Diagnosis Date   Arthritis    "joints; fingers, knees" (03/06/2017)   CAD (coronary artery disease)    a. 01/2016 Inf STEMI->PCI/Thrombectomy/DES to mid RCA (4.0 x 30 Resolute DES), EF 25-30%; b. 01/2016 Staged PCI/DES to mLAD (3.0x22 Resolute DES); c. 10/2016 Cath: LM nl, LAD patent stent, 30d, D2 70, LCX nl, OM1/2 nl, RCA patent mid/dist stent.   CLL (chronic lymphocytic leukemia) (HCC)    "never taken any RX; just monitor this twice/year" (03/06/2017)   Diverticulitis    GERD (gastroesophageal reflux disease)    H/O Bell's palsy 1960s   HFrEF (heart failure with reduced ejection fraction) (HCC)    High cholesterol    History of kidney stones    HOH (hard of hearing)    Iron deficiency anemia    Ischemic cardiomyopathy    a. 01/2016 EF 25-30%; b. 04/2016 f/u echo: EF 36%, mild glob HK, basal and mid inflat, inf, infsept, mid apical inf AK, mildly dil LA/RA, mild AI/MR, trace TR; c. 06/2016 Cardiac MRI: EF 36%, apical AK, mod diff HK, septal-lateral dyssynchrony   LBBB (left bundle branch block)    Mobitz type 2 second degree AV block 03/06/2017   Myocardial infarction (HCC) 01/27/2016   Presence of permanent cardiac pacemaker    Skin cancer    a. on head years ago   Tachy-brady syndrome (HCC)    a. 02/2017 s/p SJM 3262 Quadra Allur MP CRT-P.    Past Surgical History:  Procedure Laterality Date   BACK SURGERY     BI-VENTRICULAR PACEMAKER INSERTION (CRT-P)  03/06/2017   BIV PACEMAKER  INSERTION CRT-P N/A 03/06/2017   Procedure: BIV PACEMAKER INSERTION CRT-P;  Surgeon: Tammie Fall, MD;  Location: [facility] INVASIVE CV LAB;  Service: Cardiovascular;  Laterality: N/A;   CARDIAC CATHETERIZATION N/A 07/29/2014   Procedure: Left Heart Cath;  Surgeon: Ronney Cola, MD;  Location: [facility] INVASIVE CV LAB;  Service: Cardiovascular;  Laterality: N/A;   CARDIAC CATHETERIZATION N/A 01/27/2016   Procedure: Left Heart Cath and Coronary Angiography;  Surgeon: Knox Perl, MD;  Location: [facility] INVASIVE CV LAB;  Service: Cardiovascular;  Laterality: N/A;   CARDIAC CATHETERIZATION N/A 01/27/2016   Procedure: Coronary Stent Intervention;  Surgeon: Knox Perl, MD;  Location: [facility] INVASIVE CV LAB;  Service: Cardiovascular;  Laterality: N/A;   CARDIAC CATHETERIZATION N/A 02/13/2016   Procedure: Coronary Stent Intervention;  Surgeon: Knox Perl, MD;  Location: [facility] INVASIVE CV LAB;  Service: Cardiovascular;  Laterality: N/A;   CARDIAC CATHETERIZATION N/A 02/13/2016   Procedure: Left Heart Cath and Coronary Angiography;  Surgeon: Knox Perl, MD;  Location: [facility] INVASIVE CV LAB;  Service: Cardiovascular;  Laterality: N/A;   CARDIAC CATHETERIZATION     CATARACT EXTRACTION W/PHACO Right 07/18/2014   Procedure: CATARACT EXTRACTION PHACO AND INTRAOCULAR LENS PLACEMENT (IOC);  Surgeon: Steven Dingeldein, MD;  Location: [facility] ORS;  Service: Ophthalmology;  Laterality: Right;  US  03:11 AP% 28.4 CDE 75.64   CATARACT EXTRACTION W/PHACO Left 06/19/2015  Procedure: CATARACT EXTRACTION PHACO AND INTRAOCULAR LENS PLACEMENT (IOC);  Surgeon: Steven Dingeldein, MD;  Location: [facility] ORS;  Service: Ophthalmology;  Laterality: Left;  US  02:36.5 AP% 27.7 CDE 69.51 fluid pack lot # [id number]   COLONOSCOPY WITH PROPOFOL  N/A 04/20/2018   Procedure: COLONOSCOPY WITH PROPOFOL ;  Surgeon: Cassie Click, MD;  Location: [facility] ENDOSCOPY;  Service: Endoscopy;  Laterality: N/A;   ESOPHAGOGASTRODUODENOSCOPY (EGD) WITH PROPOFOL  N/A 04/20/2018   Procedure:  ESOPHAGOGASTRODUODENOSCOPY (EGD) WITH PROPOFOL ;  Surgeon: Cassie Click, MD;  Location: [facility] ENDOSCOPY;  Service: Endoscopy;  Laterality: N/A;   EYE SURGERY     INGUINAL HERNIA REPAIR Left 2010   INSERT / REPLACE / REMOVE PACEMAKER     LEFT HEART CATH AND CORONARY ANGIOGRAPHY N/A 11/18/2016   Procedure: LEFT HEART CATH AND CORONARY ANGIOGRAPHY;  Surgeon: Wenona Hamilton, MD;  Location: [facility] INVASIVE CV LAB;  Service: Cardiovascular;  Laterality: N/A;   LUMBAR DISC SURGERY  1983   "ruptured disc; took a piece off my buttocks and fixed it"   SKIN CANCER EXCISION     "top of my head"   TONSILLECTOMY       Home Medications:  Prior to Admission medications   Medication Sig Start Date End Date Taking? Authorizing Provider  aspirin  EC 81 MG tablet Take 1 tablet (81 mg total) by mouth daily. Swallow whole. 05/28/22  Yes Verona Goodwill, MD  atorvastatin  (LIPITOR ) 80 MG tablet Take 1 tablet (80 mg total) by mouth every evening. 01/02/18  Yes Florette Hurry, NP  Calcium  Carbonate-Vit D-Min (CALTRATE PLUS PO) Take 2 tablets by mouth daily.   Yes [provider]  carvedilol  (COREG ) 3.125 MG tablet TAKE 1 TABLET BY MOUTH TWICE DAILY 10/16/22  Yes Verona Goodwill, MD  co-enzyme Q-10 30 MG capsule Take 30 mg by mouth daily.   Yes [provider]  Cyanocobalamin  (B-12) 500 MCG SUBL Place 500 mcg under the tongue 2 (two) times a week.   Yes [provider]  ferrous gluconate (FERGON) 324 MG tablet Take 324 mg by mouth 2 (two) times a week. Takes 1 tablet PO twice per week   Yes [provider]  JARDIANCE  10 MG TABS tablet TAKE ONE TABLET EVERY DAY 10/16/22  Yes Verona Goodwill, MD  lisinopril  (PRINIVIL ,ZESTRIL ) 5 MG tablet Take 2.5 mg by mouth daily. 08/23/16  Yes [provider]  nitroGLYCERIN  (NITROSTAT ) 0.4 MG SL tablet Place 1 tablet (0.4 mg total) under the tongue every 5 (five) minutes x 3 doses as needed for chest pain. 06/06/23  Yes Verona Goodwill, MD  pantoprazole  (PROTONIX ) 40 MG tablet Take 40 mg by mouth daily. Taking 1 tablet daily   Yes [provider]  spironolactone  (ALDACTONE ) 25 MG tablet TAKE 1/2 TABLET BY MOUTH ONCE DAILY 01/20/23  Yes Klein, Steven C, MD    Inpatient Medications: Scheduled Meds:  Chlorhexidine  Gluconate Cloth  6 each Topical Q0600   gentamicin  (GARAMYCIN ) 80 mg in sodium chloride  0.9 % 500 mL irrigation  80 mg Irrigation On Call   Continuous Infusions:  sodium chloride  50 mL/hr at 07/01/23 0708    ceFAZolin  (ANCEF ) IV     PRN Meds:   Allergies:    Allergies  Allergen Reactions   Niaspan [Niacin Er (Antihyperlipidemic)] Anaphylaxis and Other (See Comments)    Episode happened 10 to 15 years ago.  Has not tried any other similar meds since   Adhesive [Tape] Other (See Comments)    Skin is  very sensitive; PLEASE USE PAPER TAPE!!    Social History:   Social History   Socioeconomic History   Marital status: Married    Spouse name: Not on file   Number of children: Not on file   Years of education: Not on file   Highest education level: Not on file  Occupational History   Not on file  Tobacco Use   Smoking status: Former    Types: Pipe, Software engineer    Quit date: 1960    Years since quitting: 65.3   Smokeless tobacco: Former    Types: Chew    Quit date: 1960  Vaping Use   Vaping status: Never Used  Substance and Sexual Activity   Alcohol  use: No   Drug use: Never   Sexual activity: Not Currently  Other Topics Concern   Not on file  Social History Narrative   Lives in [place] with his wife.  She has RA and he helps to take care of her.  He is very active in his yard and around the house.   Social Drivers of Corporate investment banker Strain: Low Risk  (03/24/2023)   Received from [facility]   Overall Financial Resource Strain (CARDIA)    Difficulty of Paying Living Expenses: Not hard at all  Food Insecurity: No Food Insecurity (03/24/2023)   Received from  [facility]   Hunger Vital Sign    Worried About Running Out of Food in the Last Year: Never true    Ran Out of Food in the Last Year: Never true  Transportation Needs: No Transportation Needs (03/24/2023)   Received from [facility] - Transportation    In the past 12 months, has lack of transportation kept you from medical appointments or from getting medications?: No    Lack of Transportation (Non-Medical): No  Physical Activity: Not on file  Stress: Not on file  Social Connections: Not on file  Intimate Partner Violence: Not on file    Family History:    Family History  Problem Relation Age of Onset   Heart failure Father      ROS:  Please see the history of present illness.   All other ROS reviewed and negative.     Physical Exam/Data:    Vitals:   07/01/23 0700  BP: 120/69  Pulse: (!) 55  Resp: (!) 26  Temp: 97.9 F (36.6 C)  TempSrc: Oral  SpO2: 99%  Weight: 83.9 kg  Height: 5\' 10"  (1.778 m)   No intake or output data in the 24 hours ending 07/01/23 0737    07/01/2023    7:00 AM 05/29/2023   10:07 AM 05/28/2022   11:05 AM  Last 3 Weights  Weight (lbs) 185 lb 184 lb 175 lb 6.4 oz  Weight (kg) 83.915 kg 83.462 kg 79.561 kg      General: Well developed, in no acute distress.  Neck: No JVD.  Cardiac: Normal rate, regular rhythm. Well healed left chest pacer pocket. Resp: Normal work of breathing.  Ext: No edema.  Neuro: No gross focal deficits.  Psych: Normal affect.    Assessment & Plan .     #S/p CRT-P #High grade AV block #Chronic systolic heart failure secondary to ischemic cardiomyopathy -Pacemaker has reached RRT. Explained risks, benefits, and alternatives to pacemaker generator change, including but not limited to bleeding, infection, damage to heart or lungs.  Pt verbalized understanding and agrees to proceed today.  For questions or updates, please contact [facility] Please consult  [email or web address] for contact info under        Signed, Ardeen Kohler, MD

## 2023-07-02 NOTE — Progress Notes (Signed)
 Remote pacemaker transmission.

## 2023-07-03 DIAGNOSIS — Z45010 Encounter for checking and testing of cardiac pacemaker pulse generator [battery]: Secondary | ICD-10-CM

## 2023-07-07 ENCOUNTER — Encounter: Payer: Self-pay | Admitting: Internal Medicine

## 2023-07-07 NOTE — Progress Notes (Unsigned)
 Electrophysiology Clinic Note    Date:  07/08/2023  Patient ID:  Christopher Daniels, Christopher Daniels 1934-11-30, MRN [id number] PCP:  Sari Cunning, MD  Cardiologist:  Richardo Chandler, MD Electrophysiologist: Richardo Chandler, MD   Discussed the use of AI scribe software for clinical note transcription with the patient, who gave verbal consent to proceed.   Patient Profile    Chief Complaint: L finger numbness after recent gen change  History of Present Illness: Christopher Daniels is a 89 y.o. male with PMH notable for CHB, LBBB, ICM, CHF s/p CRT-D, CAD s/p PCI; seen today for Richardo Chandler, MD for acute visit due to upper extremity tingling after gen change last week.    He underwent recent gen change last week with Dr. Daneil Dunker. Yesterday, he messaged clinic with L finger tingling.   On follow-up today, he states that his L finger tingling started yesterday at about 3:30 am and lasted a couple hours eventually resolving spontaneously. The symptoms woke him from sleep. He has never had any similar symptoms. He had full range of motion, full strength in his L hand and arm. He was initially worried he was having a stroke, but had normal mentation, normal facial expressions, normal speech, normal gait.   He does endorse tenderness at L chest gen change site and some edema around PPM pocket. Pain is relieved with OTC tylenol .     he denies chest pain, palpitations, dyspnea, PND, orthopnea, nausea, vomiting, dizziness, syncope, lower extremity edema, weight gain, or early satiety.     Arrhythmia/Device History St Jude CRT-D, imp 02/2017; dx high grade HB - gen change 06/2023     ROS:  Please see the history of present illness. All other systems are reviewed and otherwise negative.    Physical Exam    VS:  BP 118/68 (BP Location: Left Arm, Patient Position: Sitting, Cuff Size: Normal)   Pulse 76   Ht 5\' 10"  (1.778 m)   Wt 180 lb 12.8 oz (82 kg)   SpO2 94%   BMI 25.94 kg/m  BMI: Body mass index is  25.94 kg/m.  Wt Readings from Last 3 Encounters:  07/08/23 180 lb 12.8 oz (82 kg)  07/01/23 185 lb (83.9 kg)  05/29/23 184 lb (83.5 kg)     GEN- The patient is well appearing, alert and oriented x 3 today.   Lungs- Clear to ausculation bilaterally, normal work of breathing.  Heart- Regular rate and rhythm, no murmurs, rubs or gallops Extremities- No peripheral edema, warm, dry. L hand warm with normal ROM, 2+ radial pulse, normal strength Skin-  L chest device pocket with evolving ecchymosis, no tethering, slight edema.    Device interrogation done today and reviewed by myself:  Battery 5 + years Lead thresholds, impedence, sensing stable. LV wire chronically elevated threshold BiV P > 99% No episodes No changes made today   Studies Reviewed   Previous EP, cardiology notes.    EKG is not ordered. Personal review of EKG from 05/29/2023 shows:  AV pace at 55        TTE, 04/11/2020  1. Left ventricular ejection fraction, by estimation, is 40 to 45%. The left ventricle has mildly decreased function. The left ventricle demonstrates global hypokinesis. Left ventricular diastolic parameters are consistent with Grade I diastolic dysfunction (impaired relaxation).   2. Right ventricular systolic function is normal. The right ventricular size is normal.   3. Left atrial size was mildly dilated.   4. Right atrial  size was mildly dilated.   5. The mitral valve is grossly normal. Mild mitral valve regurgitation. No evidence of mitral stenosis.   6. The aortic valve is tricuspid. There is mild thickening of the aortic valve. Aortic valve regurgitation is mild to moderate.   7. Aortic dilatation noted. There is borderline dilatation of the ascending aorta, measuring 36 mm.   LHC, 11/18/2016 Mid RCA to Dist RCA lesion, 0 %stenosed. Mid LAD lesion, 0 %stenosed. Ost 2nd Diag to 2nd Diag lesion, 70 %stenosed. Mid LAD to Dist LAD lesion, 30 %stenosed.   1. Significant underlying 2 vessel  coronary artery disease with widely patent stents in the right coronary artery and mid LAD. Second diagonal is jailed by the LAD stent with 70% ostial stenosis. 2. Mildly elevated left ventricular end-diastolic pressure. Left ventricular angiography was not performed.   Cardiac MRI, 07/01/2016 1. Mildly dilated LV with EF 36%. Akinetic apex with diffuse moderate hypokinesis in the remainder of the ventricle. Septal-lateral dyssynchrony. 2. Moderately dilated right ventricle with mildly decreased systolic function. The RV apex appears akinetic.  3.  Probably moderate mitral regurgitation.  4. Significant LGE noted in the apical septal wall and the true apex. These territories are likely nonviable.     Assessment and Plan      #) CRT-D in situ s/p recent gen change #) L finger numbness/tingling  Patient had 1 episode of numbness to L 4th and 5th finger with normal strength. He is s/p recent gen change to L chest CIED device. Device pocket has some slight edema. He has normal strength, normal sensation to fingers and hand currently  Inflammation to the pocket is likely cause of his symptoms Recommended he continue to monitor for numbness/tingling and notify office if recurs.  Close follow-up scheduled next week with device nurse when both myself and Dr. Daneil Dunker are in office        Current medicines are reviewed at length with the patient today.   The patient does not have concerns regarding his medicines.  The following changes were made today:  none  Labs/ tests ordered today include:  No orders of the defined types were placed in this encounter.    Disposition: Follow up with Device Nurse next week    Signed, Adaline Holly, NP  07/08/23  12:06 PM  Electrophysiology [facility]

## 2023-07-08 ENCOUNTER — Ambulatory Visit: Attending: Cardiology | Admitting: Cardiology

## 2023-07-08 VITALS — BP 118/68 | HR 76 | Ht 70.0 in | Wt 180.8 lb

## 2023-07-08 DIAGNOSIS — I447 Left bundle-branch block, unspecified: Secondary | ICD-10-CM

## 2023-07-08 DIAGNOSIS — Z95810 Presence of automatic (implantable) cardiac defibrillator: Secondary | ICD-10-CM | POA: Diagnosis not present

## 2023-07-08 DIAGNOSIS — I442 Atrioventricular block, complete: Secondary | ICD-10-CM

## 2023-07-08 DIAGNOSIS — I5022 Chronic systolic (congestive) heart failure: Secondary | ICD-10-CM | POA: Diagnosis not present

## 2023-07-08 NOTE — Patient Instructions (Addendum)
 Follow-Up: At [facility], you and your health needs are our priority.  As part of our continuing mission to provide you with exceptional heart care, our providers are all part of one team.  This team includes your primary Cardiologist (physician) and Advanced Practice Providers or APPs (Physician Assistants and Nurse Practitioners) who all work together to provide you with the care you need, when you need it.  Your next appointment:   1 week(s) with device nurse    Other Instructions  Continue to monitor for L arm numbness and tingling. Make sure symptoms are not in the L leg, L face - these could be signs of a stroke.  All device readings are normal, so it is likely inflammation or swelling at the [facility] site.  No mowing yet.  Follow up next week for removal of steri-strips.

## 2023-07-14 ENCOUNTER — Encounter

## 2023-07-14 DIAGNOSIS — M21622 Bunionette of left foot: Secondary | ICD-10-CM | POA: Diagnosis not present

## 2023-07-14 DIAGNOSIS — M79672 Pain in left foot: Secondary | ICD-10-CM | POA: Diagnosis not present

## 2023-07-14 DIAGNOSIS — D2372 Other benign neoplasm of skin of left lower limb, including hip: Secondary | ICD-10-CM | POA: Diagnosis not present

## 2023-07-14 DIAGNOSIS — Q828 Other specified congenital malformations of skin: Secondary | ICD-10-CM | POA: Diagnosis not present

## 2023-07-14 DIAGNOSIS — L851 Acquired keratosis [keratoderma] palmaris et plantaris: Secondary | ICD-10-CM | POA: Diagnosis not present

## 2023-07-15 ENCOUNTER — Ambulatory Visit: Attending: Cardiovascular Disease

## 2023-07-15 ENCOUNTER — Ambulatory Visit: Admitting: Cardiology

## 2023-07-15 DIAGNOSIS — I442 Atrioventricular block, complete: Secondary | ICD-10-CM

## 2023-07-15 DIAGNOSIS — I447 Left bundle-branch block, unspecified: Secondary | ICD-10-CM | POA: Diagnosis not present

## 2023-07-15 DIAGNOSIS — I5022 Chronic systolic (congestive) heart failure: Secondary | ICD-10-CM

## 2023-07-15 LAB — CUP PACEART INCLINIC DEVICE CHECK
Battery Remaining Longevity: 63 mo
Battery Voltage: 2.99 V
Brady Statistic RA Percent Paced: 68 %
Brady Statistic RV Percent Paced: 99.47 %
Date Time Interrogation Session: 20250520095829
Implantable Lead Connection Status: 753985
Implantable Lead Connection Status: 753985
Implantable Lead Connection Status: 753985
Implantable Lead Implant Date: 20190110
Implantable Lead Implant Date: 20190110
Implantable Lead Implant Date: 20190110
Implantable Lead Location: 753858
Implantable Lead Location: 753859
Implantable Lead Location: 753860
Implantable Pulse Generator Implant Date: 20250506
Lead Channel Impedance Value: 412.5 Ohm
Lead Channel Impedance Value: 412.5 Ohm
Lead Channel Impedance Value: 700 Ohm
Lead Channel Pacing Threshold Amplitude: 0.75 V
Lead Channel Pacing Threshold Amplitude: 0.75 V
Lead Channel Pacing Threshold Amplitude: 0.75 V
Lead Channel Pacing Threshold Amplitude: 0.75 V
Lead Channel Pacing Threshold Amplitude: 1.5 V
Lead Channel Pacing Threshold Amplitude: 1.5 V
Lead Channel Pacing Threshold Pulse Width: 0.5 ms
Lead Channel Pacing Threshold Pulse Width: 0.5 ms
Lead Channel Pacing Threshold Pulse Width: 0.5 ms
Lead Channel Pacing Threshold Pulse Width: 0.5 ms
Lead Channel Pacing Threshold Pulse Width: 1.5 ms
Lead Channel Pacing Threshold Pulse Width: 1.5 ms
Lead Channel Sensing Intrinsic Amplitude: 12 mV
Lead Channel Sensing Intrinsic Amplitude: 3.4 mV
Lead Channel Setting Pacing Amplitude: 2 V
Lead Channel Setting Pacing Amplitude: 2.5 V
Lead Channel Setting Pacing Amplitude: 2.5 V
Lead Channel Setting Pacing Pulse Width: 0.5 ms
Lead Channel Setting Pacing Pulse Width: 1.5 ms
Lead Channel Setting Sensing Sensitivity: 2 mV
Pulse Gen Model: 3562
Pulse Gen Serial Number: 8250906

## 2023-07-15 NOTE — Progress Notes (Signed)
 Normal in-clinic CRT-D (multi-lead) wound check- edges well approximated- no s/s of infection post generator change. Presenting Rhythm: AP/BP- 60. Routine testing was performed. Thresholds, sensing, impedance trend were stable and no changes were required. HF diagnostics still initiating. No treated arrhythmias. Patient BiV pacing >99 % of the time. Estimated longevity 5.1-5.6 years . Pt enrolled in remote follow-up.

## 2023-07-15 NOTE — Patient Instructions (Signed)

## 2023-07-17 ENCOUNTER — Ambulatory Visit: Admitting: Cardiology

## 2023-07-18 ENCOUNTER — Other Ambulatory Visit: Payer: Self-pay | Admitting: Internal Medicine

## 2023-07-20 ENCOUNTER — Ambulatory Visit: Payer: Self-pay | Admitting: Cardiology

## 2023-07-21 ENCOUNTER — Other Ambulatory Visit: Payer: Self-pay | Admitting: Internal Medicine

## 2023-07-23 NOTE — Addendum Note (Signed)
 Addended by: Lott Rouleau A on: 07/23/2023 11:42 AM   Modules accepted: Orders

## 2023-07-23 NOTE — Progress Notes (Signed)
 Remote pacemaker transmission.

## 2023-08-14 ENCOUNTER — Encounter

## 2023-08-23 DIAGNOSIS — R0781 Pleurodynia: Secondary | ICD-10-CM | POA: Diagnosis not present

## 2023-08-23 DIAGNOSIS — S299XXA Unspecified injury of thorax, initial encounter: Secondary | ICD-10-CM | POA: Diagnosis not present

## 2023-08-24 DIAGNOSIS — Z23 Encounter for immunization: Secondary | ICD-10-CM | POA: Diagnosis not present

## 2023-08-24 DIAGNOSIS — R0781 Pleurodynia: Secondary | ICD-10-CM | POA: Diagnosis not present

## 2023-08-24 DIAGNOSIS — S51811A Laceration without foreign body of right forearm, initial encounter: Secondary | ICD-10-CM | POA: Diagnosis not present

## 2023-08-24 DIAGNOSIS — S299XXA Unspecified injury of thorax, initial encounter: Secondary | ICD-10-CM | POA: Diagnosis not present

## 2023-09-11 ENCOUNTER — Ambulatory Visit

## 2023-09-11 DIAGNOSIS — I442 Atrioventricular block, complete: Secondary | ICD-10-CM

## 2023-09-11 LAB — CUP PACEART REMOTE DEVICE CHECK
Battery Remaining Longevity: 65 mo
Battery Remaining Percentage: 95.5 %
Battery Voltage: 2.98 V
Brady Statistic AP VP Percent: 70 %
Brady Statistic AP VS Percent: 1 %
Brady Statistic AS VP Percent: 29 %
Brady Statistic AS VS Percent: 1 %
Brady Statistic RA Percent Paced: 70 %
Date Time Interrogation Session: 20250717020008
Implantable Lead Connection Status: 753985
Implantable Lead Connection Status: 753985
Implantable Lead Connection Status: 753985
Implantable Lead Implant Date: 20190110
Implantable Lead Implant Date: 20190110
Implantable Lead Implant Date: 20190110
Implantable Lead Location: 753858
Implantable Lead Location: 753859
Implantable Lead Location: 753860
Implantable Pulse Generator Implant Date: 20250506
Lead Channel Impedance Value: 430 Ohm
Lead Channel Impedance Value: 440 Ohm
Lead Channel Impedance Value: 640 Ohm
Lead Channel Pacing Threshold Amplitude: 0.75 V
Lead Channel Pacing Threshold Amplitude: 1.375 V
Lead Channel Pacing Threshold Amplitude: 1.5 V
Lead Channel Pacing Threshold Pulse Width: 0.5 ms
Lead Channel Pacing Threshold Pulse Width: 0.5 ms
Lead Channel Pacing Threshold Pulse Width: 1.5 ms
Lead Channel Sensing Intrinsic Amplitude: 12 mV
Lead Channel Sensing Intrinsic Amplitude: 3.8 mV
Lead Channel Setting Pacing Amplitude: 2 V
Lead Channel Setting Pacing Amplitude: 2.5 V
Lead Channel Setting Pacing Amplitude: 2.5 V
Lead Channel Setting Pacing Pulse Width: 0.5 ms
Lead Channel Setting Pacing Pulse Width: 1.5 ms
Lead Channel Setting Sensing Sensitivity: 2 mV
Pulse Gen Model: 3562
Pulse Gen Serial Number: 8250906

## 2023-09-14 ENCOUNTER — Ambulatory Visit: Payer: Self-pay | Admitting: Cardiology

## 2023-09-16 DIAGNOSIS — E782 Mixed hyperlipidemia: Secondary | ICD-10-CM | POA: Diagnosis not present

## 2023-09-16 DIAGNOSIS — E538 Deficiency of other specified B group vitamins: Secondary | ICD-10-CM | POA: Diagnosis not present

## 2023-09-23 DIAGNOSIS — E538 Deficiency of other specified B group vitamins: Secondary | ICD-10-CM | POA: Diagnosis not present

## 2023-09-23 DIAGNOSIS — E782 Mixed hyperlipidemia: Secondary | ICD-10-CM | POA: Diagnosis not present

## 2023-09-23 DIAGNOSIS — C9110 Chronic lymphocytic leukemia of B-cell type not having achieved remission: Secondary | ICD-10-CM | POA: Diagnosis not present

## 2023-09-23 DIAGNOSIS — M818 Other osteoporosis without current pathological fracture: Secondary | ICD-10-CM | POA: Diagnosis not present

## 2023-09-23 DIAGNOSIS — Z125 Encounter for screening for malignant neoplasm of prostate: Secondary | ICD-10-CM | POA: Diagnosis not present

## 2023-09-23 DIAGNOSIS — R739 Hyperglycemia, unspecified: Secondary | ICD-10-CM | POA: Diagnosis not present

## 2023-09-29 DIAGNOSIS — C44329 Squamous cell carcinoma of skin of other parts of face: Secondary | ICD-10-CM | POA: Diagnosis not present

## 2023-09-29 DIAGNOSIS — D485 Neoplasm of uncertain behavior of skin: Secondary | ICD-10-CM | POA: Diagnosis not present

## 2023-09-29 NOTE — Progress Notes (Unsigned)
 Electrophysiology Clinic Note    Date:  09/30/2023  Patient ID:  Christopher Daniels, Christopher Daniels 1934-11-15, MRN [id number] PCP:  Cleotilde Oneil FALCON, MD  Cardiologist:  Deatrice Cage, MD   Electrophysiologist:  Dr. Fernande > Fonda Kitty, MD     Discussed the use of AI scribe software for clinical note transcription with the patient, who gave verbal consent to proceed.   Patient Profile    Chief Complaint: CRT-P gen change follow-up  History of Present Illness: Christopher Daniels is a 89 y.o. male with PMH notable for high grade AVB, LBBB, ICM, CHF s/p CRT-P, CAD s/p PCI (2017) ; seen today for Fonda Kitty, MD for routine electrophysiology follow-up s/p CRT-D gen change.  He is s/p gen change 06/2023 by Dr. Kitty. I saw him the following week for L finger numbness without other concerning s/s of TIA/CVA, thought d/t positioning during case.  On follow-up today, he is doing very well. He has noticed that the superior part of his generator can feels different under his skin than his previous device. He denies pain that the site.  He remains very active caring for multiple property's yards weekly. He denies chest pain, chest pressure, palpitations or increased edema.     Arrhythmia/Device History St Jude CRT-P, imp 02/2017; dx high grade HB - gen change 06/2023     ROS:  Please see the history of present illness. All other systems are reviewed and otherwise negative.    Physical Exam    VS:  BP 130/60 (BP Location: Left Arm, Patient Position: Sitting, Cuff Size: Normal)   Pulse 60   Ht 5' 10 (1.778 m)   Wt 182 lb 9.6 oz (82.8 kg)   SpO2 95%   BMI 26.20 kg/m  BMI: Body mass index is 26.2 kg/m.      Wt Readings from Last 3 Encounters:  09/30/23 182 lb 9.6 oz (82.8 kg)  07/08/23 180 lb 12.8 oz (82 kg)  07/01/23 185 lb (83.9 kg)     GEN- The patient is well appearing, alert and oriented x 3 today.   Lungs- Clear to ausculation bilaterally, normal work of breathing.  Heart-  Regular rate and rhythm, no murmurs, rubs or gallops Extremities- Trace peripheral edema, warm, dry Skin-  device pocket well-healed, no tethering   Device interrogation done today and reviewed by myself:  Battery 5-6 years Lead thresholds, impedence, sensing stable  High VP CorVue stable One very brief AMS episodes No changes made today   Studies Reviewed   Previous EP, cardiology notes.    EKG is ordered. Personal review of EKG from today shows:    EKG Interpretation Date/Time:  Tuesday September 30 2023 13:49:51 EDT Ventricular Rate:  60 PR Interval:  170 QRS Duration:  140 QT Interval:  456 QTC Calculation: 456 R Axis:   -33  Text Interpretation: AV dual-paced rhythm Confirmed by Berdie Malter [phone]) on 09/30/2023 2:27:14 PM    TTE, 04/11/2020  1. Left ventricular ejection fraction, by estimation, is 40 to 45%. The left ventricle has mildly decreased function. The left ventricle demonstrates global hypokinesis. Left ventricular diastolic parameters are consistent with Grade I diastolic dysfunction (impaired relaxation).   2. Right ventricular systolic function is normal. The right ventricular size is normal.   3. Left atrial size was mildly dilated.   4. Right atrial size was mildly dilated.   5. The mitral valve is grossly normal. Mild mitral valve regurgitation. No evidence of mitral stenosis.  6. The aortic valve is tricuspid. There is mild thickening of the aortic valve. Aortic valve regurgitation is mild to moderate.   7. Aortic dilatation noted. There is borderline dilatation of the ascending aorta, measuring 36 mm.    LHC, 11/18/2016 Mid RCA to Dist RCA lesion, 0 %stenosed. Mid LAD lesion, 0 %stenosed. Ost 2nd Diag to 2nd Diag lesion, 70 %stenosed. Mid LAD to Dist LAD lesion, 30 %stenosed.   1. Significant underlying 2 vessel coronary artery disease with widely patent stents in the right coronary artery and mid LAD. Second diagonal is jailed by the LAD stent with  70% ostial stenosis. 2. Mildly elevated left ventricular end-diastolic pressure. Left ventricular angiography was not performed.    Cardiac MRI, 07/01/2016 1. Mildly dilated LV with EF 36%. Akinetic apex with diffuse moderate hypokinesis in the remainder of the ventricle. Septal-lateral dyssynchrony. 2. Moderately dilated right ventricle with mildly decreased systolic function. The RV apex appears akinetic.  3.  Probably moderate mitral regurgitation.  4. Significant LGE noted in the apical septal wall and the true apex. These territories are likely nonviable.     Assessment and Plan     #) CHB #) ICM #) LBBB #) CRT-p in situ Device functioning well, see paceart for details Stable lead measurements LV threshold chronically elevated High VP  #) HFmrEF, previously reduced LVEF Appears euvolemic on exam with good functional capacity Will have him establish care with general cardiology  Conitnue 12.5mg  spiro, 3.125mg  coreg  BID, 10mg  jardiance  2.5mg  lisinoprol      Current medicines are reviewed at length with the patient today.   The patient does not have concerns regarding his medicines.  The following changes were made today:  none  Labs/ tests ordered today include:  Orders Placed This Encounter  Procedures   EKG 12-Lead     Disposition: Follow up with Dr. Kennyth or EP APP in 12 months  Schedule appt with general cardiology to establish care.   Signed, Sanchez Hemmer, NP  09/30/23  2:30 PM  Electrophysiology [facility]

## 2023-09-30 ENCOUNTER — Ambulatory Visit: Attending: Cardiology | Admitting: Cardiology

## 2023-09-30 ENCOUNTER — Encounter

## 2023-09-30 VITALS — BP 130/60 | HR 60 | Ht 70.0 in | Wt 182.6 lb

## 2023-09-30 DIAGNOSIS — I447 Left bundle-branch block, unspecified: Secondary | ICD-10-CM | POA: Diagnosis not present

## 2023-09-30 DIAGNOSIS — I4430 Unspecified atrioventricular block: Secondary | ICD-10-CM | POA: Diagnosis not present

## 2023-09-30 DIAGNOSIS — I5022 Chronic systolic (congestive) heart failure: Secondary | ICD-10-CM | POA: Diagnosis not present

## 2023-09-30 DIAGNOSIS — I255 Ischemic cardiomyopathy: Secondary | ICD-10-CM | POA: Diagnosis not present

## 2023-09-30 DIAGNOSIS — Z95810 Presence of automatic (implantable) cardiac defibrillator: Secondary | ICD-10-CM

## 2023-09-30 LAB — CUP PACEART INCLINIC DEVICE CHECK
Battery Remaining Longevity: 61 mo
Battery Voltage: 2.96 V
Brady Statistic RA Percent Paced: 71 %
Brady Statistic RV Percent Paced: 99.37 %
Date Time Interrogation Session: 20250805150405
Implantable Lead Connection Status: 753985
Implantable Lead Connection Status: 753985
Implantable Lead Connection Status: 753985
Implantable Lead Implant Date: 20190110
Implantable Lead Implant Date: 20190110
Implantable Lead Implant Date: 20190110
Implantable Lead Location: 753858
Implantable Lead Location: 753859
Implantable Lead Location: 753860
Implantable Pulse Generator Implant Date: 20250506
Lead Channel Impedance Value: 425 Ohm
Lead Channel Impedance Value: 437.5 Ohm
Lead Channel Impedance Value: 687.5 Ohm
Lead Channel Pacing Threshold Amplitude: 0.625 V
Lead Channel Pacing Threshold Amplitude: 0.75 V
Lead Channel Pacing Threshold Amplitude: 0.75 V
Lead Channel Pacing Threshold Amplitude: 1.5 V
Lead Channel Pacing Threshold Amplitude: 1.5 V
Lead Channel Pacing Threshold Amplitude: 1.5 V
Lead Channel Pacing Threshold Pulse Width: 0.5 ms
Lead Channel Pacing Threshold Pulse Width: 0.5 ms
Lead Channel Pacing Threshold Pulse Width: 0.5 ms
Lead Channel Pacing Threshold Pulse Width: 0.5 ms
Lead Channel Pacing Threshold Pulse Width: 1.5 ms
Lead Channel Pacing Threshold Pulse Width: 1.5 ms
Lead Channel Sensing Intrinsic Amplitude: 12 mV
Lead Channel Sensing Intrinsic Amplitude: 2.3 mV
Lead Channel Setting Pacing Amplitude: 2 V
Lead Channel Setting Pacing Amplitude: 2.5 V
Lead Channel Setting Pacing Amplitude: 2.5 V
Lead Channel Setting Pacing Pulse Width: 0.5 ms
Lead Channel Setting Pacing Pulse Width: 1.5 ms
Lead Channel Setting Sensing Sensitivity: 2 mV
Pulse Gen Model: 3562
Pulse Gen Serial Number: 8250906

## 2023-09-30 NOTE — Patient Instructions (Signed)
 Medication Instructions:  The current medical regimen is effective;  continue present plan and medications as directed. Please refer to the Current Medication list given to you today.   *If you need a refill on your cardiac medications before your next appointment, please call your pharmacy*  Follow-Up: At [facility], you and your health needs are our priority.  As part of our continuing mission to provide you with exceptional heart care, our providers are all part of one team.  This team includes your primary Cardiologist (physician) and Advanced Practice Providers or APPs (Physician Assistants and Nurse Practitioners) who all work together to provide you with the care you need, when you need it.  Your next appointment:   12 month(s)  Provider:   Fonda Kitty, MD or [name], NP   Needs to get established back with either Dr.Gollan or Dr.Arida (will be new patient)   We recommend signing up for the patient portal called MyChart.  Sign up information is provided on this After Visit Summary.  MyChart is used to connect with patients for Virtual Visits (Telemedicine).  Patients are able to view lab/test results, encounter notes, upcoming appointments, etc.  Non-urgent messages can be sent to your provider as well.   To learn more about what you can do with MyChart, go to [email or web address].

## 2023-10-02 ENCOUNTER — Ambulatory Visit: Payer: Self-pay | Admitting: Cardiology

## 2023-11-23 NOTE — Progress Notes (Unsigned)
 Cardiology Office Note  Date:  11/24/2023   ID:  Christopher Daniels, DOB [birth year], MRN [id number]  PCP:  Cleotilde Oneil FALCON, MD   Chief Complaint  Patient presents with   New Patient (Initial Visit)    Establish care for CAD/stent placement x 2 in 2017 per [name], NP. Patient denies chest pain or shortness of breath.     HPI:  Christopher Daniels a 89 y.o. malewith past medical history of: Remote smoking, pipe, no cig Coronary artery disease,  dilated cardiomyopathy, Paroxysmal atrial fibrillation Tachybradycardia syndrome AV block type II St Jude CRT-P, imp 02/2017; dx high grade HB- gen change 06/2023 Previously seen by myself February 2023 Seen by EP August 2025 Who presents for new patient evaluation to general cardiology for his coronary artery disease, cardiomyopathy   Echocardiogram February 2022 Ejection fraction 40 to 45%   Cardiac MRI May 2018 Ejection fraction 36%, moderate MR   Cardiac catheterization September 2018 Significant underlying 2 vessel coronary artery disease with widely patent stents in the right coronary artery and mid LAD. Second diagonal is jailed by the LAD stent with 70% ostial stenosis.  3 months ago, tripped on lawn mower Could not breath for 2-3 days Seen in the ER, bruised  Total chol 116, LDL 60 Normal CMP  EKG personally reviewed by myself on todays visit AV Paced rhythm rate 61 bpm  PMH:   has a past medical history of Arthritis, CAD (coronary artery disease), CLL (chronic lymphocytic leukemia) (HCC), Diverticulitis, GERD (gastroesophageal reflux disease), H/O Bell's palsy (1960s), HFrEF (heart failure with reduced ejection fraction) (HCC), High cholesterol, History of kidney stones, HOH (hard of hearing), Iron deficiency anemia, Ischemic cardiomyopathy, LBBB (left bundle branch block), Mobitz type 2 second degree AV block (03/06/2017), Myocardial infarction (HCC) (01/27/2016), Presence of permanent cardiac pacemaker, Skin cancer, and  Tachy-brady syndrome (HCC).  PSH:    Past Surgical History:  Procedure Laterality Date   BACK SURGERY     BI-VENTRICULAR PACEMAKER INSERTION (CRT-P)  03/06/2017   BIV PACEMAKER INSERTION CRT-P N/A 03/06/2017   Procedure: BIV PACEMAKER INSERTION CRT-P;  Surgeon: Waddell Danelle ORN, MD;  Location: [facility] INVASIVE CV LAB;  Service: Cardiovascular;  Laterality: N/A;   CARDIAC CATHETERIZATION N/A 07/29/2014   Procedure: Left Heart Cath;  Surgeon: Vinie DELENA Jude, MD;  Location: [facility] INVASIVE CV LAB;  Service: Cardiovascular;  Laterality: N/A;   CARDIAC CATHETERIZATION N/A 01/27/2016   Procedure: Left Heart Cath and Coronary Angiography;  Surgeon: Gordy Bergamo, MD;  Location: [facility] INVASIVE CV LAB;  Service: Cardiovascular;  Laterality: N/A;   CARDIAC CATHETERIZATION N/A 01/27/2016   Procedure: Coronary Stent Intervention;  Surgeon: Gordy Bergamo, MD;  Location: [facility] INVASIVE CV LAB;  Service: Cardiovascular;  Laterality: N/A;   CARDIAC CATHETERIZATION N/A 02/13/2016   Procedure: Coronary Stent Intervention;  Surgeon: Gordy Bergamo, MD;  Location: [facility] INVASIVE CV LAB;  Service: Cardiovascular;  Laterality: N/A;   CARDIAC CATHETERIZATION N/A 02/13/2016   Procedure: Left Heart Cath and Coronary Angiography;  Surgeon: Gordy Bergamo, MD;  Location: [facility] INVASIVE CV LAB;  Service: Cardiovascular;  Laterality: N/A;   CARDIAC CATHETERIZATION     CATARACT EXTRACTION W/PHACO Right 07/18/2014   Procedure: CATARACT EXTRACTION PHACO AND INTRAOCULAR LENS PLACEMENT (IOC);  Surgeon: Steven Dingeldein, MD;  Location: [facility] ORS;  Service: Ophthalmology;  Laterality: Right;  US  03:11 AP% 28.4 CDE 75.64   CATARACT EXTRACTION W/PHACO Left 06/19/2015   Procedure: CATARACT EXTRACTION PHACO AND INTRAOCULAR LENS PLACEMENT (IOC);  Surgeon: Steven Dingeldein, MD;  Location: [facility] ORS;  Service: Ophthalmology;  Laterality: Left;  US  02:36.5 AP% 27.7 CDE 69.51 fluid pack lot # [id number]   COLONOSCOPY WITH PROPOFOL  N/A 04/20/2018   Procedure: COLONOSCOPY WITH  PROPOFOL ;  Surgeon: Viktoria Lamar DASEN, MD;  Location: [facility] ENDOSCOPY;  Service: Endoscopy;  Laterality: N/A;   ESOPHAGOGASTRODUODENOSCOPY (EGD) WITH PROPOFOL  N/A 04/20/2018   Procedure: ESOPHAGOGASTRODUODENOSCOPY (EGD) WITH PROPOFOL ;  Surgeon: Viktoria Lamar DASEN, MD;  Location: [facility] ENDOSCOPY;  Service: Endoscopy;  Laterality: N/A;   EYE SURGERY     INGUINAL HERNIA REPAIR Left 2010   INSERT / REPLACE / REMOVE PACEMAKER     LEFT HEART CATH AND CORONARY ANGIOGRAPHY N/A 11/18/2016   Procedure: LEFT HEART CATH AND CORONARY ANGIOGRAPHY;  Surgeon: Darron Deatrice LABOR, MD;  Location: [facility] INVASIVE CV LAB;  Service: Cardiovascular;  Laterality: N/A;   LUMBAR DISC SURGERY  1983   ruptured disc; took a piece off my buttocks and fixed it   PPM GENERATOR CHANGEOUT N/A 07/01/2023   Procedure: PPM GENERATOR CHANGEOUT;  Surgeon: Kennyth Chew, MD;  Location: [facility] INVASIVE CV LAB;  Service: Cardiovascular;  Laterality: N/A;   SKIN CANCER EXCISION     top of my head   TONSILLECTOMY      Current Outpatient Medications  Medication Sig Dispense Refill   aspirin  EC 81 MG tablet Take 1 tablet (81 mg total) by mouth daily. Swallow whole. 30 tablet 12   atorvastatin  (LIPITOR ) 80 MG tablet Take 1 tablet (80 mg total) by mouth every evening. 90 tablet 3   Calcium  Carbonate-Vit D-Min (CALTRATE PLUS PO) Take 2 tablets by mouth daily.     carvedilol  (COREG ) 3.125 MG tablet TAKE 1 TABLET BY MOUTH TWICE DAILY 180 tablet 3   co-enzyme Q-10 30 MG capsule Take 30 mg by mouth daily.     Cyanocobalamin  (B-12) 500 MCG SUBL Place 500 mcg under the tongue 2 (two) times a week.     ferrous gluconate (FERGON) 324 MG tablet Take 324 mg by mouth 2 (two) times a week. Takes 1 tablet PO twice per week (Patient taking differently: Take 324 mg by mouth once a week. Takes 1 tablet PO twice per week)     JARDIANCE  10 MG TABS tablet TAKE ONE TABLET EVERY DAY 100 tablet 1   lisinopril  (PRINIVIL ,ZESTRIL ) 5 MG tablet Take 2.5 mg by mouth  daily.     nitroGLYCERIN  (NITROSTAT ) 0.4 MG SL tablet Place 1 tablet (0.4 mg total) under the tongue every 5 (five) minutes x 3 doses as needed for chest pain. 25 tablet 3   pantoprazole  (PROTONIX ) 40 MG tablet Take 40 mg by mouth daily. Taking 1 tablet daily     spironolactone  (ALDACTONE ) 25 MG tablet TAKE 1/2 TABLET BY MOUTH ONCE DAILY 45 tablet 2   No current facility-administered medications for this visit.     Allergies:   Niaspan [niacin er (antihyperlipidemic)] and Adhesive [tape]   Social History:  The patient  reports that he quit smoking about 65 years ago. His smoking use included pipe and cigars. He quit smokeless tobacco use about 65 years ago.  His smokeless tobacco use included chew. He reports that he does not drink alcohol  and does not use drugs.   Family History:   family history includes Heart failure in his father.    Review of Systems: Review of Systems  Constitutional: Negative.   HENT: Negative.    Eyes: Negative.   Respiratory: Negative.    Cardiovascular: Negative.   Gastrointestinal: Negative.  Genitourinary: Negative.   Musculoskeletal: Negative.   Skin: Negative.   Neurological: Negative.   Endo/Heme/Allergies: Negative.   Psychiatric/Behavioral: Negative.    All other systems reviewed and are negative.    PHYSICAL EXAM: VS:  BP (!) 108/54 (BP Location: Left Arm, Patient Position: Sitting, Cuff Size: Normal)   Pulse 61   Ht 5' 10 (1.778 m)   Wt 182 lb 2 oz (82.6 kg)   SpO2 98%   BMI 26.13 kg/m  , BMI Body mass index is 26.13 kg/m. GEN: Well nourished, well developed, in no acute distress HEENT: normal Neck: no JVD, carotid bruits, or masses Cardiac: RRR; no murmurs, rubs, or gallops,no edema  Respiratory:  clear to auscultation bilaterally, normal work of breathing GI: soft, nontender, nondistended, + BS MS: no deformity or atrophy Skin: warm and dry, no rash Neuro:  Strength and sensation are intact Psych: euthymic mood, full  affect   Recent Labs: 06/23/2023: BUN 14; Creatinine, Ser 1.12; Hemoglobin 13.6; Platelets 216; Potassium 4.3; Sodium 141    Lipid Panel Lab Results  Component Value Date   CHOL 109 08/01/2016   HDL 32 (L) 08/01/2016   LDLCALC 62 08/01/2016   TRIG 73 08/01/2016      Wt Readings from Last 3 Encounters:  11/24/23 182 lb 2 oz (82.6 kg)  09/30/23 182 lb 9.6 oz (82.8 kg)  07/08/23 180 lb 12.8 oz (82 kg)       ASSESSMENT AND PLAN:  Problem List Items Addressed This Visit       Cardiology Problems   Paroxysmal atrial fibrillation (HCC)   Relevant Orders   EKG 12-Lead (Completed)   Aortic atherosclerosis   Relevant Orders   EKG 12-Lead (Completed)   Combined hyperlipidemia   CAD (coronary artery disease), native coronary artery - Primary   Relevant Orders   EKG 12-Lead (Completed)   Other Visit Diagnoses       Complete heart block (HCC)       Relevant Orders   EKG 12-Lead (Completed)     Sinus bradycardia       Relevant Orders   EKG 12-Lead (Completed)     Cardiac resynchronization therapy defibrillator (CRT-D) in place         Biventricular implantable cardioverter-defibrillator in situ       Relevant Orders   EKG 12-Lead (Completed)     Ischemic cardiomyopathy       Relevant Orders   EKG 12-Lead (Completed)      Ischemic cardiomyopathy Euvolemic, on appropriate medical therapy, GDMT maximized Will continue beta-blocker, spironolactone , lisinopril , Jardiance , each of the medications discussed No changes made, reports feeling well, good energy  Complete heart block Managed by EP, pacer in place, AV paced on EKG  Hyperlipidemia Numbers at goal Continue statin  Essential hypertension Blood pressure is well controlled on today's visit. No changes made to the medications.      Signed, Velinda Lunger, M.D., Ph.D. [facility] [place], [place] [phone]

## 2023-11-24 ENCOUNTER — Encounter: Payer: Self-pay | Admitting: Cardiovascular Disease

## 2023-11-24 ENCOUNTER — Ambulatory Visit: Attending: Cardiovascular Disease | Admitting: Cardiovascular Disease

## 2023-11-24 VITALS — BP 108/54 | HR 61 | Ht 70.0 in | Wt 182.1 lb

## 2023-11-24 DIAGNOSIS — E782 Mixed hyperlipidemia: Secondary | ICD-10-CM | POA: Diagnosis not present

## 2023-11-24 DIAGNOSIS — I700 Atherosclerosis of aorta: Secondary | ICD-10-CM

## 2023-11-24 DIAGNOSIS — I442 Atrioventricular block, complete: Secondary | ICD-10-CM | POA: Diagnosis not present

## 2023-11-24 DIAGNOSIS — I255 Ischemic cardiomyopathy: Secondary | ICD-10-CM

## 2023-11-24 DIAGNOSIS — I25118 Atherosclerotic heart disease of native coronary artery with other forms of angina pectoris: Secondary | ICD-10-CM

## 2023-11-24 DIAGNOSIS — I480 Paroxysmal atrial fibrillation: Secondary | ICD-10-CM

## 2023-11-24 DIAGNOSIS — R001 Bradycardia, unspecified: Secondary | ICD-10-CM | POA: Diagnosis not present

## 2023-11-24 DIAGNOSIS — Z95810 Presence of automatic (implantable) cardiac defibrillator: Secondary | ICD-10-CM | POA: Diagnosis not present

## 2023-11-24 NOTE — Patient Instructions (Addendum)
 Medication Instructions:   Your physician recommends that you continue on your current medications as directed. Please refer to the Current Medication list given to you today.   *If you need a refill on your cardiac medications before your next appointment, please call your pharmacy*  Lab Work: No labs ordered today  If you have labs (blood work) drawn today and your tests are completely normal, you will receive your results only by: MyChart Message (if you have MyChart) OR A paper copy in the mail If you have any lab test that is abnormal or we need to change your treatment, we will call you to review the results.  Testing/Procedures: No test ordered today   Follow-Up: At [facility], you and your health needs are our priority.  As part of our continuing mission to provide you with exceptional heart care, our providers are all part of one team.  This team includes your primary Cardiologist (physician) and Advanced Practice Providers or APPs (Physician Assistants and Nurse Practitioners) who all work together to provide you with the care you need, when you need it.  Your next appointment:   12 month(s)  Provider:   You may see Dr. Timothy Gollan or one of the following Advanced Practice Providers on your designated Care Team:   Lonni Meager, NP Lesley Maffucci, PA-C Bernardino Bring, PA-C Cadence [place], PA-C Tylene Lunch, NP Barnie Hila, NP    We recommend signing up for the patient portal called MyChart.  Sign up information is provided on this After Visit Summary.  MyChart is used to connect with patients for Virtual Visits (Telemedicine).  Patients are able to view lab/test results, encounter notes, upcoming appointments, etc.  Non-urgent messages can be sent to your provider as well.   To learn more about what you can do with MyChart, go to [email or web address].

## 2023-11-27 DIAGNOSIS — D0439 Carcinoma in situ of skin of other parts of face: Secondary | ICD-10-CM | POA: Diagnosis not present

## 2023-11-27 DIAGNOSIS — C44329 Squamous cell carcinoma of skin of other parts of face: Secondary | ICD-10-CM | POA: Diagnosis not present

## 2023-12-02 DIAGNOSIS — Z23 Encounter for immunization: Secondary | ICD-10-CM | POA: Diagnosis not present

## 2023-12-02 NOTE — Progress Notes (Signed)
 Remote PPM Transmission

## 2023-12-11 ENCOUNTER — Ambulatory Visit

## 2023-12-11 DIAGNOSIS — I442 Atrioventricular block, complete: Secondary | ICD-10-CM

## 2023-12-12 LAB — CUP PACEART REMOTE DEVICE CHECK
Battery Remaining Longevity: 63 mo
Battery Remaining Percentage: 94 %
Battery Voltage: 2.96 V
Brady Statistic AP VP Percent: 74 %
Brady Statistic AP VS Percent: 1 %
Brady Statistic AS VP Percent: 25 %
Brady Statistic AS VS Percent: 1 %
Brady Statistic RA Percent Paced: 74 %
Date Time Interrogation Session: 20251016020010
Implantable Lead Connection Status: 753985
Implantable Lead Connection Status: 753985
Implantable Lead Connection Status: 753985
Implantable Lead Implant Date: 20190110
Implantable Lead Implant Date: 20190110
Implantable Lead Implant Date: 20190110
Implantable Lead Location: 753858
Implantable Lead Location: 753859
Implantable Lead Location: 753860
Implantable Pulse Generator Implant Date: 20250506
Lead Channel Impedance Value: 430 Ohm
Lead Channel Impedance Value: 460 Ohm
Lead Channel Impedance Value: 700 Ohm
Lead Channel Pacing Threshold Amplitude: 0.625 V
Lead Channel Pacing Threshold Amplitude: 1.125 V
Lead Channel Pacing Threshold Amplitude: 1.5 V
Lead Channel Pacing Threshold Pulse Width: 0.5 ms
Lead Channel Pacing Threshold Pulse Width: 0.5 ms
Lead Channel Pacing Threshold Pulse Width: 1.5 ms
Lead Channel Sensing Intrinsic Amplitude: 12 mV
Lead Channel Sensing Intrinsic Amplitude: 3.5 mV
Lead Channel Setting Pacing Amplitude: 2 V
Lead Channel Setting Pacing Amplitude: 2.5 V
Lead Channel Setting Pacing Amplitude: 2.5 V
Lead Channel Setting Pacing Pulse Width: 0.5 ms
Lead Channel Setting Pacing Pulse Width: 1.5 ms
Lead Channel Setting Sensing Sensitivity: 2 mV
Pulse Gen Model: 3562
Pulse Gen Serial Number: 8250906

## 2023-12-18 NOTE — Progress Notes (Signed)
 Remote PPM Transmission

## 2023-12-20 ENCOUNTER — Ambulatory Visit: Payer: Self-pay | Admitting: Cardiology

## 2023-12-30 ENCOUNTER — Encounter

## 2024-01-05 DIAGNOSIS — D485 Neoplasm of uncertain behavior of skin: Secondary | ICD-10-CM | POA: Diagnosis not present

## 2024-01-05 DIAGNOSIS — B079 Viral wart, unspecified: Secondary | ICD-10-CM | POA: Diagnosis not present

## 2024-01-05 DIAGNOSIS — Z85828 Personal history of other malignant neoplasm of skin: Secondary | ICD-10-CM | POA: Diagnosis not present

## 2024-01-05 DIAGNOSIS — D2271 Melanocytic nevi of right lower limb, including hip: Secondary | ICD-10-CM | POA: Diagnosis not present

## 2024-01-05 DIAGNOSIS — D044 Carcinoma in situ of skin of scalp and neck: Secondary | ICD-10-CM | POA: Diagnosis not present

## 2024-01-05 DIAGNOSIS — D2262 Melanocytic nevi of left upper limb, including shoulder: Secondary | ICD-10-CM | POA: Diagnosis not present

## 2024-01-05 DIAGNOSIS — L570 Actinic keratosis: Secondary | ICD-10-CM | POA: Diagnosis not present

## 2024-01-05 DIAGNOSIS — D2261 Melanocytic nevi of right upper limb, including shoulder: Secondary | ICD-10-CM | POA: Diagnosis not present

## 2024-01-05 DIAGNOSIS — D2272 Melanocytic nevi of left lower limb, including hip: Secondary | ICD-10-CM | POA: Diagnosis not present

## 2024-01-16 ENCOUNTER — Other Ambulatory Visit: Payer: Self-pay | Admitting: Cardiology

## 2024-01-19 MED ORDER — SPIRONOLACTONE 25 MG PO TABS: 12.5000 mg | ORAL_TABLET | Freq: Every day | ORAL | 2 refills | Status: AC

## 2024-01-29 DIAGNOSIS — D044 Carcinoma in situ of skin of scalp and neck: Secondary | ICD-10-CM | POA: Diagnosis not present

## 2024-02-09 ENCOUNTER — Inpatient Hospital Stay: Payer: PPO | Attending: Oncology

## 2024-02-09 DIAGNOSIS — C9110 Chronic lymphocytic leukemia of B-cell type not having achieved remission: Secondary | ICD-10-CM | POA: Diagnosis present

## 2024-02-09 LAB — CBC WITH DIFFERENTIAL/PLATELET
Abs Immature Granulocytes: 0.02 K/uL (ref 0.00–0.07)
Basophils Absolute: 0.1 K/uL (ref 0.0–0.1)
Basophils Relative: 1 %
Eosinophils Absolute: 0.1 K/uL (ref 0.0–0.5)
Eosinophils Relative: 1 %
HCT: 41.7 % (ref 39.0–52.0)
Hemoglobin: 13.8 g/dL (ref 13.0–17.0)
Immature Granulocytes: 0 %
Lymphocytes Relative: 66 %
Lymphs Abs: 7.3 K/uL — ABNORMAL HIGH (ref 0.7–4.0)
MCH: 32.9 pg (ref 26.0–34.0)
MCHC: 33.1 g/dL (ref 30.0–36.0)
MCV: 99.5 fL (ref 80.0–100.0)
Monocytes Absolute: 0.6 K/uL (ref 0.1–1.0)
Monocytes Relative: 6 %
Neutro Abs: 2.8 K/uL (ref 1.7–7.7)
Neutrophils Relative %: 26 %
Platelets: 197 K/uL (ref 150–400)
RBC: 4.19 MIL/uL — ABNORMAL LOW (ref 4.22–5.81)
RDW: 13.7 % (ref 11.5–15.5)
WBC: 10.9 K/uL — ABNORMAL HIGH (ref 4.0–10.5)
nRBC: 0 % (ref 0.0–0.2)

## 2024-02-10 ENCOUNTER — Inpatient Hospital Stay (HOSPITAL_BASED_OUTPATIENT_CLINIC_OR_DEPARTMENT_OTHER): Payer: PPO | Admitting: Oncology

## 2024-02-10 ENCOUNTER — Encounter: Payer: Self-pay | Admitting: Oncology

## 2024-02-10 DIAGNOSIS — Z862 Personal history of diseases of the blood and blood-forming organs and certain disorders involving the immune mechanism: Secondary | ICD-10-CM | POA: Diagnosis not present

## 2024-02-10 DIAGNOSIS — C9110 Chronic lymphocytic leukemia of B-cell type not having achieved remission: Secondary | ICD-10-CM

## 2024-02-10 NOTE — Progress Notes (Unsigned)
 [facility]  [phone] [phone]) [phone]  ID: Christopher Daniels OB: Dec 07, 1934  MR#: [id number]  RDW#:[id number]  Patient Care Team: Cleotilde Oneil FALCON, MD as PCP - General (Internal Medicine) Kennyth Chew, MD as PCP - Electrophysiology (Cardiology) Darron Deatrice DELENA, MD as PCP - Cardiology (Cardiology) Jacobo Evalene PARAS, MD as Consulting Physician (Oncology) Riddle, Suzann, NP as Nurse Practitioner (Clinical Cardiac Electrophysiology) Vivienne Lonni Ingle, NP as Nurse Practitioner (Cardiology)  I connected with Christopher Daniels on 02/11/2024 at  3:30 PM EST by video enabled telemedicine visit and verified that I am speaking with the correct person using two identifiers.   I discussed the limitations, risks, security and privacy concerns of performing an evaluation and management service by telemedicine and the availability of in-person appointments. I also discussed with the patient that there may be a patient responsible charge related to this service. The patient expressed understanding and agreed to proceed.   Other persons participating in the visit and their role in the encounter: Patient, MD.  Patients location: Home. Providers location: Clinic.  CHIEF COMPLAINT: CLL.  INTERVAL HISTORY: Patient agreed to video-assisted telemedicine visit for repeat laboratory work and routine yearly evaluation.  He continues to feel well and remains asymptomatic.  He has no neurologic complaints.  He has no fevers or night sweats.  He has a good appetite and denies weight loss. He denies any chest pain, cough, hemoptysis, or shortness of breath.  He denies any nausea, vomiting, constipation, or diarrhea.  He has no melena or hematochezia.  He has no urinary complaints.  Patient feels at his baseline and offers no specific complaints today.  REVIEW OF SYSTEMS:   Review of Systems  Constitutional: Negative.  Negative for fever, malaise/fatigue and weight loss.   Respiratory: Negative.  Negative for cough and shortness of breath.   Cardiovascular: Negative.  Negative for chest pain and leg swelling.  Gastrointestinal: Negative.  Negative for abdominal pain, blood in stool, constipation and melena.  Genitourinary: Negative.  Negative for dysuria and hematuria.  Musculoskeletal: Negative.  Negative for back pain.  Skin: Negative.  Negative for rash.  Neurological: Negative.  Negative for sensory change, focal weakness and weakness.  Endo/Heme/Allergies:  Does not bruise/bleed easily.  Psychiatric/Behavioral: Negative.  The patient is not nervous/anxious.     As per HPI. Otherwise, a complete review of systems is negative.  PAST MEDICAL HISTORY: Past Medical History:  Diagnosis Date   Arthritis    joints; fingers, knees (03/06/2017)   CAD (coronary artery disease)    a. 01/2016 Inf STEMI->PCI/Thrombectomy/DES to mid RCA (4.0 x 30 Resolute DES), EF 25-30%; b. 01/2016 Staged PCI/DES to mLAD (3.0x22 Resolute DES); c. 10/2016 Cath: LM nl, LAD patent stent, 30d, D2 70, LCX nl, OM1/2 nl, RCA patent mid/dist stent.   CLL (chronic lymphocytic leukemia) (HCC)    never taken any RX; just monitor this twice/year (03/06/2017)   Diverticulitis    GERD (gastroesophageal reflux disease)    H/O Bell's palsy 1960s   HFrEF (heart failure with reduced ejection fraction) (HCC)    High cholesterol    History of kidney stones    HOH (hard of hearing)    Iron deficiency anemia    Ischemic cardiomyopathy    a. 01/2016 EF 25-30%; b. 04/2016 f/u echo: EF 36%, mild glob HK, basal and mid inflat, inf, infsept, mid apical inf AK, mildly dil LA/RA, mild AI/MR, trace TR; c. 06/2016 Cardiac MRI: EF 36%, apical AK, mod diff HK, septal-lateral  dyssynchrony   LBBB (left bundle branch block)    Mobitz type 2 second degree AV block 03/06/2017   Myocardial infarction (HCC) 01/27/2016   Presence of permanent cardiac pacemaker    Skin cancer    a. on head years ago   Tachy-brady  syndrome (HCC)    a. 02/2017 s/p SJM 3262 Quadra Allur MP CRT-P.    PAST SURGICAL HISTORY: Past Surgical History:  Procedure Laterality Date   BACK SURGERY     BI-VENTRICULAR PACEMAKER INSERTION (CRT-P)  03/06/2017   BIV PACEMAKER INSERTION CRT-P N/A 03/06/2017   Procedure: BIV PACEMAKER INSERTION CRT-P;  Surgeon: Waddell Danelle ORN, MD;  Location[facility] INVASIVE CV LAB;  Service: Cardiovascular;  Laterality: N/A;   CARDIAC CATHETERIZATION N/A 07/29/2014   Procedure: Left Heart Cath;  Surgeon: Vinie DELENA Jude, MD;  Location: [facility] INVASIVE CV LAB;  Service: Cardiovascular;  Laterality: N/A;   CARDIAC CATHETERIZATION N/A 01/27/2016   Procedure: Left Heart Cath and Coronary Angiography;  Surgeon: Gordy Bergamo, MD;  Location: [facility] INVASIVE CV LAB;  Service: Cardiovascular;  Laterality: N/A;   CARDIAC CATHETERIZATION N/A 01/27/2016   Procedure: Coronary Stent Intervention;  Surgeon: Gordy Bergamo, MD;  Location: [facility] INVASIVE CV LAB;  Service: Cardiovascular;  Laterality: N/A;   CARDIAC CATHETERIZATION N/A 02/13/2016   Procedure: Coronary Stent Intervention;  Surgeon: Gordy Bergamo, MD;  Location: [facility] INVASIVE CV LAB;  Service: Cardiovascular;  Laterality: N/A;   CARDIAC CATHETERIZATION N/A 02/13/2016   Procedure: Left Heart Cath and Coronary Angiography;  Surgeon: Gordy Bergamo, MD;  Location: [facility] INVASIVE CV LAB;  Service: Cardiovascular;  Laterality: N/A;   CARDIAC CATHETERIZATION     CATARACT EXTRACTION W/PHACO Right 07/18/2014   Procedure: CATARACT EXTRACTION PHACO AND INTRAOCULAR LENS PLACEMENT (IOC);  Surgeon: Steven Dingeldein, MD;  Location: [facility] ORS;  Service: Ophthalmology;  Laterality: Right;  US  03:11 AP% 28.4 CDE 75.64   CATARACT EXTRACTION W/PHACO Left 06/19/2015   Procedure: CATARACT EXTRACTION PHACO AND INTRAOCULAR LENS PLACEMENT (IOC);  Surgeon: Steven Dingeldein, MD;  Location: [facility] ORS;  Service: Ophthalmology;  Laterality: Left;  US  02:36.5 AP% 27.7 CDE 69.51 fluid pack lot # [id number]   COLONOSCOPY WITH  PROPOFOL  N/A 04/20/2018   Procedure: COLONOSCOPY WITH PROPOFOL ;  Surgeon: Viktoria Lamar DASEN, MD;  Location: [facility] ENDOSCOPY;  Service: Endoscopy;  Laterality: N/A;   ESOPHAGOGASTRODUODENOSCOPY (EGD) WITH PROPOFOL  N/A 04/20/2018   Procedure: ESOPHAGOGASTRODUODENOSCOPY (EGD) WITH PROPOFOL ;  Surgeon: Viktoria Lamar DASEN, MD;  Location: [facility] ENDOSCOPY;  Service: Endoscopy;  Laterality: N/A;   EYE SURGERY     INGUINAL HERNIA REPAIR Left 2010   INSERT / REPLACE / REMOVE PACEMAKER     LEFT HEART CATH AND CORONARY ANGIOGRAPHY N/A 11/18/2016   Procedure: LEFT HEART CATH AND CORONARY ANGIOGRAPHY;  Surgeon: Darron Deatrice DELENA, MD;  Location: [facility] INVASIVE CV LAB;  Service: Cardiovascular;  Laterality: N/A;   LUMBAR DISC SURGERY  1983   ruptured disc; took a piece off my buttocks and fixed it   PPM GENERATOR CHANGEOUT N/A 07/01/2023   Procedure: PPM GENERATOR CHANGEOUT;  Surgeon: Kennyth Chew, MD;  Location[facility] INVASIVE CV LAB;  Service: Cardiovascular;  Laterality: N/A;   SKIN CANCER EXCISION     top of my head   TONSILLECTOMY      FAMILY HISTORY: Reviewed and unchanged. No reported history of malignancy or chronic disease.     ADVANCED DIRECTIVES:    HEALTH MAINTENANCE: Social History   Tobacco Use   Smoking status: Former    Types: Pipe, Software Engineer  Quit date: 30    Years since quitting: 66.0   Smokeless tobacco: Former    Types: Chew    Quit date: 1960  Vaping Use   Vaping status: Never Used  Substance Use Topics   Alcohol  use: No   Drug use: Never     Colonoscopy:  PAP:  Bone density:  Lipid panel:  Allergies  Allergen Reactions   Niaspan [Niacin Er (Antihyperlipidemic)] Anaphylaxis and Other (See Comments)    Episode happened 10 to 15 years ago.  Has not tried any other similar meds since   Adhesive [Tape] Other (See Comments)    Skin is very sensitive; PLEASE USE PAPER TAPE!!    Current Outpatient Medications  Medication Sig Dispense Refill   aspirin  EC 81 MG tablet  Take 1 tablet (81 mg total) by mouth daily. Swallow whole. 30 tablet 12   atorvastatin  (LIPITOR ) 80 MG tablet Take 1 tablet (80 mg total) by mouth every evening. 90 tablet 3   Calcium  Carbonate-Vit D-Min (CALTRATE PLUS PO) Take 2 tablets by mouth daily.     carvedilol  (COREG ) 3.125 MG tablet TAKE 1 TABLET BY MOUTH TWICE DAILY 180 tablet 3   co-enzyme Q-10 30 MG capsule Take 30 mg by mouth daily.     Cyanocobalamin  (B-12) 500 MCG SUBL Place 500 mcg under the tongue 2 (two) times a week.     ferrous gluconate (FERGON) 324 MG tablet Take 324 mg by mouth 2 (two) times a week. Takes 1 tablet PO twice per week (Patient taking differently: Take 324 mg by mouth once a week. Takes 1 tablet PO twice per week)     JARDIANCE  10 MG TABS tablet TAKE ONE TABLET EVERY DAY 100 tablet 1   lisinopril  (PRINIVIL ,ZESTRIL ) 5 MG tablet Take 2.5 mg by mouth daily.     nitroGLYCERIN  (NITROSTAT ) 0.4 MG SL tablet Place 1 tablet (0.4 mg total) under the tongue every 5 (five) minutes x 3 doses as needed for chest pain. 25 tablet 3   pantoprazole  (PROTONIX ) 40 MG tablet Take 40 mg by mouth daily. Taking 1 tablet daily     spironolactone  (ALDACTONE ) 25 MG tablet Take 0.5 tablets (12.5 mg total) by mouth daily. 45 tablet 2   No current facility-administered medications for this visit.    OBJECTIVE: There were no vitals filed for this visit.   There is no height or weight on file to calculate BMI.    ECOG FS:0 - Asymptomatic  General: Well-developed, well-nourished, no acute distress. HEENT: Normocephalic. Neuro: Alert, answering all questions appropriately. Cranial nerves grossly intact. Psych: Normal affect.  LAB RESULTS:  Lab Results  Component Value Date   NA 141 06/23/2023   K 4.3 06/23/2023   CL 103 06/23/2023   CO2 23 06/23/2023   GLUCOSE 93 06/23/2023   BUN 14 06/23/2023   CREATININE 1.12 06/23/2023   CALCIUM  9.4 06/23/2023   PROT 6.4 (L) 02/05/2022   ALBUMIN 3.6 02/05/2022   AST 20 02/05/2022   ALT 16  02/05/2022   ALKPHOS 65 02/05/2022   BILITOT 0.7 02/05/2022   GFRNONAA >60 02/05/2022   GFRAA >60 12/11/2017    Lab Results  Component Value Date   WBC 10.9 (H) 02/09/2024   NEUTROABS 2.8 02/09/2024   HGB 13.8 02/09/2024   HCT 41.7 02/09/2024   MCV 99.5 02/09/2024   PLT 197 02/09/2024   Lab Results  Component Value Date   IRON 111 02/10/2023   TIBC 249 (L) 02/10/2023   IRONPCTSAT 45 (H)  02/10/2023   Lab Results  Component Value Date   FERRITIN 48 02/10/2023     STUDIES: No results found.  ASSESSMENT: CLL, Rai stage 0.  Iron deficiency anemia.  PLAN:    CLL: Patient's total white blood cell count is only mildly elevated at 10.9.  Since January 2014 is count has ranged from 7.4-21.9. Previously, flow cytometry confirmed a monoclonal B-cell population consistent with CLL. CT in August 2014 was reviewed independently and did not reveal any underlying lymphadenopathy.  No intervention is needed.  Patient does not require additional imaging or a bone marrow biopsy unless there is suspicion of progression of disease.  No intervention is needed.  Return to clinic in 1 year with repeat laboratory work and video-assisted telemedicine visit. Iron deficiency anemia: Resolved.  Patient's hemoglobin and iron stores continue to be within normal limits.  Colonoscopy and EGD on April 20, 2018 did not reveal any distinct pathology or evidence of bleeding.  Patient does not require additional IV Feraheme .  He last received treatment on May 26, 2018.  Continue oral iron supplementation.  Return to clinic in 1 year as above.  I provided 20 minutes of face-to-face video visit time during this encounter which included chart review, counseling, and coordination of care as documented above.      Patient expressed understanding and was in agreement with this plan. He also understands that He can call clinic at any time with any questions, concerns, or complaints.    Evalene JINNY Reusing, MD    02/11/2024 4:12 PM

## 2024-02-11 ENCOUNTER — Encounter: Payer: Self-pay | Admitting: Oncology

## 2024-03-11 ENCOUNTER — Ambulatory Visit

## 2024-03-11 DIAGNOSIS — I442 Atrioventricular block, complete: Secondary | ICD-10-CM

## 2024-03-12 LAB — CUP PACEART REMOTE DEVICE CHECK
Battery Remaining Longevity: 59 mo
Battery Remaining Percentage: 89 %
Battery Voltage: 2.96 V
Brady Statistic AP VP Percent: 74 %
Brady Statistic AP VS Percent: 1 %
Brady Statistic AS VP Percent: 25 %
Brady Statistic AS VS Percent: 1 %
Brady Statistic RA Percent Paced: 74 %
Date Time Interrogation Session: 20260115020007
Implantable Lead Connection Status: 753985
Implantable Lead Connection Status: 753985
Implantable Lead Connection Status: 753985
Implantable Lead Implant Date: 20190110
Implantable Lead Implant Date: 20190110
Implantable Lead Implant Date: 20190110
Implantable Lead Location: 753858
Implantable Lead Location: 753859
Implantable Lead Location: 753860
Implantable Pulse Generator Implant Date: 20250506
Lead Channel Impedance Value: 410 Ohm
Lead Channel Impedance Value: 430 Ohm
Lead Channel Impedance Value: 730 Ohm
Lead Channel Pacing Threshold Amplitude: 0.625 V
Lead Channel Pacing Threshold Amplitude: 1 V
Lead Channel Pacing Threshold Amplitude: 1.5 V
Lead Channel Pacing Threshold Pulse Width: 0.5 ms
Lead Channel Pacing Threshold Pulse Width: 0.5 ms
Lead Channel Pacing Threshold Pulse Width: 1.5 ms
Lead Channel Sensing Intrinsic Amplitude: 12 mV
Lead Channel Sensing Intrinsic Amplitude: 3.9 mV
Lead Channel Setting Pacing Amplitude: 2 V
Lead Channel Setting Pacing Amplitude: 2.5 V
Lead Channel Setting Pacing Amplitude: 2.5 V
Lead Channel Setting Pacing Pulse Width: 0.5 ms
Lead Channel Setting Pacing Pulse Width: 1.5 ms
Lead Channel Setting Sensing Sensitivity: 2 mV
Pulse Gen Model: 3562
Pulse Gen Serial Number: 8250906

## 2024-03-14 ENCOUNTER — Ambulatory Visit: Payer: Self-pay | Admitting: Cardiology

## 2024-03-19 NOTE — Progress Notes (Signed)
 Remote PPM Transmission

## 2024-03-24 ENCOUNTER — Telehealth: Payer: Self-pay

## 2024-03-24 NOTE — Telephone Encounter (Signed)
 CRT-P ([facility]) ALERT: BVP% Alert remote transmission: BiV pacing < limit, recent decline per trends.   Note both drops in AP and BVP.in recent weeks.  No identified changes in health or medications.   Concerns for RA lead consistent capture, LRL and AV delay programming to improve BVP.  PVC and atrial event burden is low - should not impact decline in BVP.   Reviewed with [place]: [facility] industry rep with the following recommended programming changes:   Test RA lead and re-program for improved safety margin and to ensure consistent atrial capture. Consider hard programming rather than programming with monitor or auto cap. RV/LV : recommend turning on auto cap Increasing LRL from 60 to 70 to beat out intrinsic. (If Dr. Kennyth agrees). If this doesn't work could consider tightening SAV delay; however, would not recommend going much short as already at a shorter delay of .  PAV is .   Spoke with patient.  He cannot come to [facility] to see me in Device clinic on Tuesday, only drives to [place].    [name], NP has opening that day at 130pm for acute slot.  Chris ([facility]) to call and discuss what is needed prior to appt. Also, Kennyth is there if anything further is needed for optimization/changes.   Patient aware of appointment and will be there.

## 2024-03-25 ENCOUNTER — Ambulatory Visit: Admitting: Cardiology

## 2024-03-25 ENCOUNTER — Encounter: Payer: Self-pay | Admitting: Cardiology

## 2024-03-25 VITALS — Ht 70.0 in | Wt 180.2 lb

## 2024-03-25 DIAGNOSIS — Z95810 Presence of automatic (implantable) cardiac defibrillator: Secondary | ICD-10-CM

## 2024-03-25 MED ORDER — METOPROLOL SUCCINATE ER 25 MG PO TB24: 25.0000 mg | ORAL_TABLET | Freq: Every day | ORAL | 3 refills | Status: AC

## 2024-03-25 NOTE — Patient Instructions (Signed)
 Medication Instructions:  Your physician recommends the following medication changes.  STOP TAKING: Coreg   START TAKING: Toprol  25 mg once daily.    *If you need a refill on your cardiac medications before your next appointment, please call your pharmacy*  Lab Work: No labs ordered today    Testing/Procedures: No test ordered today   Follow-Up:  Home Blood Pressure Log, check twice a day.  At [facility], you and your health needs are our priority.  As part of our continuing mission to provide you with exceptional heart care, our providers are all part of one team.  This team includes your primary Cardiologist (physician) and Advanced Practice Providers or APPs (Physician Assistants and Nurse Practitioners) who all work together to provide you with the care you need, when you need it.  Your next appointment:   2 to 3 week(s)  Provider:   [name], NP

## 2024-03-25 NOTE — Progress Notes (Signed)
 Patient seen for device adjustments after device clinic alerted for reduced BiV pacing.  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 Intrinsic PR is .  Discussed with industry Increased LRL to 70bpm, continues to have elevated VS events throughout interrogation.  Will stop coreg  and start 25mg  toprol .    Close follow-up scheduled.

## 2024-03-30 ENCOUNTER — Encounter

## 2024-03-31 ENCOUNTER — Ambulatory Visit: Admitting: Cardiology

## 2024-04-20 ENCOUNTER — Ambulatory Visit: Admitting: Cardiology

## 2024-06-10 ENCOUNTER — Encounter

## 2024-06-29 ENCOUNTER — Encounter

## 2024-09-09 ENCOUNTER — Encounter

## 2024-09-28 ENCOUNTER — Encounter

## 2024-12-09 ENCOUNTER — Encounter

## 2024-12-28 ENCOUNTER — Encounter

## 2025-02-09 ENCOUNTER — Inpatient Hospital Stay

## 2025-02-10 ENCOUNTER — Inpatient Hospital Stay: Admitting: Oncology

## 2025-03-10 ENCOUNTER — Encounter

## 2025-03-29 ENCOUNTER — Encounter

## 2025-06-09 ENCOUNTER — Encounter

## 2025-06-28 ENCOUNTER — Encounter
